# Patient Record
Sex: Female | Born: 1945 | Race: White | Hispanic: No | Marital: Married | Smoking: Former smoker
Health system: Southern US, Community
[De-identification: ages and names within clinical notes are randomized; demographics above are authoritative.]

## PROBLEM LIST (undated history)

## (undated) DIAGNOSIS — I10 Essential (primary) hypertension: Secondary | ICD-10-CM

## (undated) DIAGNOSIS — I480 Paroxysmal atrial fibrillation: Secondary | ICD-10-CM

## (undated) DIAGNOSIS — J309 Allergic rhinitis, unspecified: Secondary | ICD-10-CM

## (undated) DIAGNOSIS — K219 Gastro-esophageal reflux disease without esophagitis: Secondary | ICD-10-CM

## (undated) DIAGNOSIS — F329 Major depressive disorder, single episode, unspecified: Secondary | ICD-10-CM

## (undated) DIAGNOSIS — F32A Depression, unspecified: Secondary | ICD-10-CM

## (undated) DIAGNOSIS — M1990 Unspecified osteoarthritis, unspecified site: Secondary | ICD-10-CM

## (undated) HISTORY — DX: Major depressive disorder, single episode, unspecified: F32.9

## (undated) HISTORY — DX: Allergic rhinitis, unspecified: J30.9

## (undated) HISTORY — DX: Essential (primary) hypertension: I10

## (undated) HISTORY — DX: Unspecified osteoarthritis, unspecified site: M19.90

## (undated) HISTORY — DX: Paroxysmal atrial fibrillation: I48.0

## (undated) HISTORY — DX: Depression, unspecified: F32.A

## (undated) HISTORY — PX: TUBAL LIGATION: SHX77

## (undated) HISTORY — PX: DILATION AND CURETTAGE OF UTERUS: SHX78

---

## 2013-04-27 ENCOUNTER — Encounter: Payer: Self-pay | Admitting: Cardiology

## 2014-02-08 ENCOUNTER — Encounter: Payer: Self-pay | Admitting: Cardiology

## 2014-02-08 DIAGNOSIS — I10 Essential (primary) hypertension: Secondary | ICD-10-CM | POA: Insufficient documentation

## 2014-02-08 NOTE — Progress Notes (Signed)
Clinical Summary Tara Floyd is a 68 y.o.female referred for cardiology consultation by Dr. Melina Copa. She has a long-standing history of hypertension, has been difficult to control over the years with different medications. She states that she was on Norvasc at one point although felt "bloated" with this and had to stop. Also on Diovan which was ineffective. States that Atacand was more effective, however price is prohibitive for her. Current regimen includes Lotensin 40 mg daily and Coreg 6.25 mg twice daily, and has been tolerated without difficulty.  She denies any exertional symptoms, does Zumba 4 days a week and feels well.  Lab work from January of this year showed a hemoglobin 13.1, platelets 170, BUN 16, creatinine 0.6, potassium 4.3, normal LFTs, TSH 0.9.  ECG today shows sinus bradycardia with left anterior fascicular block, increased voltage, and nonspecific ST-T changes.   Allergies  Allergen Reactions  . Codeine Phosphate     Current Outpatient Prescriptions  Medication Sig Dispense Refill  . ALPRAZolam (XANAX) 0.5 MG tablet Take 0.5 mg by mouth 3 (three) times daily.      . benazepril-hydrochlorthiazide (LOTENSIN HCT) 20-12.5 MG per tablet Take 1 tablet by mouth 2 (two) times daily.  60 tablet  6  . Calcium Carb-Cholecalciferol (CALCIUM + D3) 600-200 MG-UNIT TABS Take 1 tablet by mouth daily.      . carvedilol (COREG) 6.25 MG tablet Take 6.25 mg by mouth 2 (two) times daily with a meal.      . fluticasone (FLONASE) 50 MCG/ACT nasal spray Place 2 sprays into both nostrils daily.      Marland Kitchen ibuprofen (ADVIL,MOTRIN) 200 MG tablet Take 800 mg by mouth every 6 (six) hours as needed.       No current facility-administered medications for this visit.    Past Medical History  Diagnosis Date  . Essential hypertension, benign   . Depression   . Allergic rhinitis   . DJD (degenerative joint disease)     Past Surgical History  Procedure Laterality Date  . Tubal ligation    .  Dilation and curettage of uterus      Family History  Problem Relation Age of Onset  . Parkinson's disease Father   . Brain cancer Mother     Social History Ms. Round reports that she quit smoking about 24 years ago. Her smoking use included Cigarettes. She smoked 0.00 packs per day. She does not have any smokeless tobacco history on file. Ms. Tippins reports that she does not drink alcohol.  Review of Systems No palpitations, exertional chest pain or unusual shortness of breath, syncope, leg edema, orthopnea or PND.  Physical Examinatio Filed Vitals:   02/09/14 1300  BP: 187/80  Pulse: 55   Filed Weights   02/09/14 1257  Weight: 178 lb (80.74 kg)   Patient appears comfortable at rest. HEENT: Conjunctiva and lids normal, oropharynx clear. Neck: Supple, no elevated JVP or carotid bruits, no thyromegaly. Lungs: Clear to auscultation, nonlabored breathing at rest. Cardiac: Regular rate and rhythm, S4 but no S3 or significant systolic murmur, no pericardial rub. Abdomen: Soft, nontender, bowel sounds present, no guarding or rebound. Extremities: No pitting edema, distal pulses 2+. Skin: Warm and dry. Musculoskeletal: No kyphosis. Neuropsychiatric: Alert and oriented x3, affect grossly appropriate.   Problem List and Plan   Essential hypertension, benign Long-standing and reportedly difficult to control over the years. We discussed sodium restriction. She exercises regularly without symptoms. ECG is abnormal consistent with LVH and repolarization abnormalities. 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 Plan is to switch to Lotensin hydrochlorothiazide 20/12.5 mg twice daily, continue Coreg. Check BMET in a few weeks as well as echocardiogram, then followup in one month.    Satira Sark, M.D., F.A.C.C.

## 2014-02-09 ENCOUNTER — Encounter: Payer: Self-pay | Admitting: Cardiology

## 2014-02-09 ENCOUNTER — Encounter: Payer: Self-pay | Admitting: Cardiovascular Disease

## 2014-02-09 ENCOUNTER — Ambulatory Visit (INDEPENDENT_AMBULATORY_CARE_PROVIDER_SITE_OTHER): Payer: Medicare Other | Admitting: Cardiology

## 2014-02-09 VITALS — BP 187/80 | HR 55 | Ht 64.0 in | Wt 178.0 lb

## 2014-02-09 DIAGNOSIS — I119 Hypertensive heart disease without heart failure: Secondary | ICD-10-CM

## 2014-02-09 DIAGNOSIS — Z79899 Other long term (current) drug therapy: Secondary | ICD-10-CM

## 2014-02-09 DIAGNOSIS — I10 Essential (primary) hypertension: Secondary | ICD-10-CM

## 2014-02-09 MED ORDER — BENAZEPRIL-HYDROCHLOROTHIAZIDE 20-12.5 MG PO TABS: 1.0000 | ORAL_TABLET | Freq: Two times a day (BID) | ORAL | Status: DC

## 2014-02-09 NOTE — Assessment & Plan Note (Signed)
Long-standing and reportedly difficult to control over the years. We discussed sodium restriction. She exercises regularly without symptoms. ECG is abnormal consistent with LVH and repolarization abnormalities. 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 Plan is to switch to Lotensin hydrochlorothiazide 20/12.5 mg twice daily, continue Coreg. Check BMET in a few weeks as well as echocardiogram, then followup in one month.

## 2014-02-09 NOTE — Patient Instructions (Signed)
Your physician has requested that you have an echocardiogram. Echocardiography is a painless test that uses sound waves to create images of your heart. It provides your doctor with information about the size and shape of your heart and how well your heart's chambers and valves are working. This procedure takes approximately one hour. There are no restrictions for this procedure. Lab for BMET - due in 3 weeks, around 03/02/2014 Office will contact with results via phone or letter.   Stop Benazepril (Lotensin) Begin Lotensin / HCTZ 20/12.5mg  twice a day  - new sent to pharm Continue all other medications.   Follow up in  1 month

## 2014-02-13 ENCOUNTER — Telehealth: Payer: Self-pay | Admitting: *Deleted

## 2014-02-13 NOTE — Telephone Encounter (Signed)
Recently saw patient on 02/09/2014.  Stopped Benazepril 40mg  daily & changed to Benazepril/HCTZ 20-12.5mg  twice a day.  [facility] denied quantity limit requirement since her plan only allows one tab per day.  Please advise.

## 2014-02-13 NOTE — Telephone Encounter (Signed)
They do not have the combination of medications in one pill that I was looking for, which is why I made the prescription the way I did. Other option would be to continue benazepril 40 mg daily, and add a separate prescription for HCTZ starting at 12.5 mg daily.

## 2014-02-14 MED ORDER — HYDROCHLOROTHIAZIDE 12.5 MG PO CAPS: 12.5000 mg | ORAL_CAPSULE | Freq: Every day | ORAL | Status: DC

## 2014-02-14 MED ORDER — BENAZEPRIL HCL 40 MG PO TABS: 40.0000 mg | ORAL_TABLET | Freq: Every day | ORAL | Status: DC

## 2014-02-14 NOTE — Telephone Encounter (Signed)
Patient notified.  States she has continued her Benazepril 40mg  daily.  Will add new rx for HCTZ 12.5mg  daily & send to [facility].

## 2014-02-18 ENCOUNTER — Encounter: Payer: Self-pay | Admitting: Cardiology

## 2014-02-18 ENCOUNTER — Encounter: Payer: Self-pay | Admitting: Cardiovascular Disease

## 2014-02-19 ENCOUNTER — Telehealth: Payer: Self-pay | Admitting: Cardiology

## 2014-02-19 NOTE — Telephone Encounter (Signed)
Pt left [facility] AMA.  Pt didn't want to wait to have her echo done there.  Please call pt to r/s pt in [place] office

## 2014-02-20 ENCOUNTER — Telehealth: Payer: Self-pay | Admitting: *Deleted

## 2014-02-20 MED ORDER — AMIODARONE HCL 200 MG PO TABS: 200.0000 mg | ORAL_TABLET | Freq: Two times a day (BID) | ORAL | Status: DC

## 2014-02-20 NOTE — Telephone Encounter (Signed)
Patient called to inform office that she was inpatient at [facility] 02/18/14 and d/c 02/19/14 for rapid heart rate. Patient said today about 30 minutes ago, her heart rate starting racing. Patient said her HR was 110 during that time. Patient took BP and HR while on the phone with nurse and her BP was 138/90 & HR 104. Patient denied chest pain, or sob but said she had some lightheadedness. Nurse advised patient that she had up coming appointment this week for an echo and f/u appointment scheduled with Domenic Polite on 03/13/14. Patient concerned about her heart rate now and wants to know if there's anything she can do for it. Please advise.

## 2014-02-20 NOTE — Telephone Encounter (Signed)
I just met her as a new consult in early May. Note reviewed. She was admitted to [facility] within the last 48 hours with reportedly newly diagnosed rapid atrial fibrillation. It looks like she converted spontaneously to sinus rhythm under observation, lab work showed no evidence of ACS, TSH normal, LFTs normal. For now I would suggest that we start her on amiodarone 200 mg twice daily until I see her back for rhythm suppression since she is symptomatic with it. She should at least be taking an aspirin daily now. CHADSVASC score is 3 and we may need to discuss potential anticoagulation. She should follow for the echocardiogram as already arranged.

## 2014-02-20 NOTE — Telephone Encounter (Signed)
Patient informed and verbalized understanding of plan. 

## 2014-02-21 ENCOUNTER — Telehealth: Payer: Self-pay | Admitting: *Deleted

## 2014-02-21 NOTE — Telephone Encounter (Signed)
Patient called to inform our office that she thinks the new medication is causing her heart rate to increase. Patient said when she got up this morning, her BP was 137/65 & HR 50. 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 No chest pain, dizziness or sob. Nurse advised patient that amiodarone wouldn't cause her heart rate to increase and that her heart rate was much better than it was before starting the amiodarone. Nurse advised patient that doing house work would increase her heart rate and she should only take her heart rate at rest. Patient informed that MD would be notified of her concerns.

## 2014-02-21 NOTE — Telephone Encounter (Signed)
We will keep an eye on this. Goal is to suppress atrial fibrillation and avoid unnecessary tachycardia.

## 2014-02-22 ENCOUNTER — Other Ambulatory Visit (INDEPENDENT_AMBULATORY_CARE_PROVIDER_SITE_OTHER): Payer: Medicare Other

## 2014-02-22 ENCOUNTER — Other Ambulatory Visit: Payer: Medicare Other

## 2014-02-22 ENCOUNTER — Other Ambulatory Visit: Payer: Self-pay

## 2014-02-22 DIAGNOSIS — I119 Hypertensive heart disease without heart failure: Secondary | ICD-10-CM

## 2014-02-22 DIAGNOSIS — I059 Rheumatic mitral valve disease, unspecified: Secondary | ICD-10-CM

## 2014-02-22 DIAGNOSIS — I4891 Unspecified atrial fibrillation: Secondary | ICD-10-CM

## 2014-02-27 ENCOUNTER — Telehealth: Payer: Self-pay | Admitting: *Deleted

## 2014-02-27 NOTE — Telephone Encounter (Signed)
Patient informed. 

## 2014-02-27 NOTE — Telephone Encounter (Signed)
Message copied by Merlene Laughter on Tue Feb 27, 2014  4:54 PM ------      Message from: Satira Sark      Created: Tue Feb 27, 2014  7:54 AM       Reviewed. Mild LVH and grade 1 diastolic dysfunction consistent with hypertension, normal systolic function with LVEF 60-65%. Will review further at office followup. ------

## 2014-03-01 ENCOUNTER — Encounter: Payer: Self-pay | Admitting: Cardiology

## 2014-03-13 ENCOUNTER — Ambulatory Visit (INDEPENDENT_AMBULATORY_CARE_PROVIDER_SITE_OTHER): Payer: Medicare Other | Admitting: Cardiology

## 2014-03-13 ENCOUNTER — Encounter: Payer: Self-pay | Admitting: Cardiology

## 2014-03-13 VITALS — BP 195/73 | HR 49 | Ht 64.0 in | Wt 175.8 lb

## 2014-03-13 DIAGNOSIS — I4891 Unspecified atrial fibrillation: Secondary | ICD-10-CM

## 2014-03-13 DIAGNOSIS — I480 Paroxysmal atrial fibrillation: Secondary | ICD-10-CM | POA: Insufficient documentation

## 2014-03-13 DIAGNOSIS — I10 Essential (primary) hypertension: Secondary | ICD-10-CM

## 2014-03-13 MED ORDER — SPIRONOLACTONE 25 MG PO TABS: 25.0000 mg | ORAL_TABLET | Freq: Every day | ORAL | Status: DC

## 2014-03-13 NOTE — Progress Notes (Signed)
Clinical Summary Tara Floyd is a 68 y.o.female seen in early May in referral from Dr. Melina Copa for assistance with management of hypertension. At that time medications were adjusted including a change to Lotensin 40 mg daily with HCTZ 12.5 mg daily. Record review finds that she was admitted briefly to [facility] later in May with an episode of paroxysmal atrial fibrillation, new diagnosis. Cardiac markers were negative at that time. She called the office after this occurred, still somewhat symptomatic with palpitations and we decided to try amiodarone for rhythm suppression. She took this for about a week he was unable to tolerate it. She states it made her feel dizzy. Since then however she has had no further palpitations and is in sinus rhythm today.  Recent followup lab work showed BUN 11, creatinine 0.8, potassium 4.9, TSH 0.56, hemoglobin 16.1, platelets 194. Chest x-ray from May 10 showed left basilar atelectasis, no other acute findings.   Echocardiogram from May 14 showed mild LVH with LVEF 32-95%, grade 1 diastolic dysfunction, mild to moderate left atrial enlargement, mild mitral regurgitation, mildly thickened aortic valve, mild tricuspid regurgitation. We discussed the results today.  CHADSVASC score is 3. We reviewed her thromboembolic risk. She did not want to consider taking an anticoagulant at this point. She states that she is very concerned about the potential bleeding risk. She is willing to take an aspirin however.  She tells me that when she checks her blood pressure at home, systolics are usually in the 150s to 160s.   Allergies  Allergen Reactions  . Codeine Phosphate     Current Outpatient Prescriptions  Medication Sig Dispense Refill  . ALPRAZolam (XANAX) 0.5 MG tablet Take 0.5 mg by mouth 3 (three) times daily as needed.       . benazepril (LOTENSIN) 40 MG tablet Take 1 tablet (40 mg total) by mouth daily.      . Calcium Carb-Cholecalciferol (CALCIUM + D3)  600-200 MG-UNIT TABS Take 1 tablet by mouth daily.      . carvedilol (COREG) 6.25 MG tablet Take 6.25 mg by mouth 2 (two) times daily with a meal.      . fluticasone (FLONASE) 50 MCG/ACT nasal spray Place 2 sprays into both nostrils daily.      Marland Kitchen ibuprofen (ADVIL,MOTRIN) 200 MG tablet Take 800 mg by mouth every 6 (six) hours as needed.      Marland Kitchen spironolactone (ALDACTONE) 25 MG tablet Take 1 tablet (25 mg total) by mouth daily.  90 tablet  3   No current facility-administered medications for this visit.    Past Medical History  Diagnosis Date  . Essential hypertension, benign   . Depression   . Allergic rhinitis   . DJD (degenerative joint disease)     Social History Tara Floyd reports that she quit smoking about 24 years ago. Her smoking use included Cigarettes. She smoked 0.00 packs per day. She does not have any smokeless tobacco history on file. Tara Floyd reports that she does not drink alcohol.  Review of Systems Negative except as outlined.  Physical Examination Filed Vitals:   03/13/14 0848  BP: 195/73  Pulse: 49   Filed Weights   03/13/14 0843  Weight: 175 lb 12.8 oz (79.742 kg)   Patient appears comfortable at rest.  HEENT: Conjunctiva and lids normal, oropharynx clear.  Neck: Supple, no elevated JVP or carotid bruits, no thyromegaly.  Lungs: Clear to auscultation, nonlabored breathing at rest.  Cardiac: Regular rate and rhythm, S4 but  no S3 or significant systolic murmur, no pericardial rub.  Abdomen: Soft, nontender, bowel sounds present, no guarding or rebound.  Extremities: No pitting edema, distal pulses 2+.  Skin: Warm and dry.  Musculoskeletal: No kyphosis.  Neuropsychiatric: Alert and oriented x3, affect grossly appropriate.   Problem List and Plan   Essential hypertension, benign Continue Lotensin, Coreg, change HCTZ to Aldactone 25 mg daily. Check BMET in 2 weeks, clinical visit in one month. I suspect we will need continued medication adjustments to get  more optimal blood pressure control.  Atrial fibrillation Paroxysmal, newly diagnosed. We did discuss thromboembolic risk (relatitvely low) and potential use of anticoagulants, she did not want to take an anticoagulant at this point however. She is willing to take aspirin daily.    Satira Sark, M.D., F.A.C.C.

## 2014-03-13 NOTE — Assessment & Plan Note (Signed)
Continue Lotensin, Coreg, change HCTZ to Aldactone 25 mg daily. Check BMET in 2 weeks, clinical visit in one month. I suspect we will need continued medication adjustments to get more optimal blood pressure control.

## 2014-03-13 NOTE — Patient Instructions (Signed)
Your physician recommends that you schedule a follow-up appointment in: 1 month. Your physician has recommended you make the following change in your medication:  Stop hydrochlorothiazide. Start spironolactone 25 mg daily. Your new prescription was sent to your pharmacy. Continue all other medications. Your physician recommends that you have lab work to check your BMET. Please have this done around 03/27/14.

## 2014-03-13 NOTE — Assessment & Plan Note (Signed)
Paroxysmal, newly diagnosed. We did discuss thromboembolic risk (relatitvely low) and potential use of anticoagulants, she did not want to take an anticoagulant at this point however. She is willing to take aspirin daily.

## 2014-04-05 ENCOUNTER — Telehealth: Payer: Self-pay | Admitting: *Deleted

## 2014-04-05 NOTE — Telephone Encounter (Signed)
Patient informed. 

## 2014-04-05 NOTE — Telephone Encounter (Signed)
Message copied by Merlene Laughter on Thu Apr 05, 2014  4:07 PM ------      Message from: MCDOWELL, Aloha Gell      Created: Mon Apr 02, 2014  9:16 PM       Renal function normal and potassium 5.1.  No change in medications for now. ------

## 2014-04-23 ENCOUNTER — Encounter: Payer: Self-pay | Admitting: Cardiology

## 2014-04-23 ENCOUNTER — Ambulatory Visit (INDEPENDENT_AMBULATORY_CARE_PROVIDER_SITE_OTHER): Payer: Medicare Other | Admitting: Cardiology

## 2014-04-23 VITALS — BP 156/74 | HR 66 | Ht 64.0 in | Wt 173.0 lb

## 2014-04-23 DIAGNOSIS — I10 Essential (primary) hypertension: Secondary | ICD-10-CM

## 2014-04-23 DIAGNOSIS — I4891 Unspecified atrial fibrillation: Secondary | ICD-10-CM

## 2014-04-23 DIAGNOSIS — I480 Paroxysmal atrial fibrillation: Secondary | ICD-10-CM

## 2014-04-23 NOTE — Patient Instructions (Signed)
Your physician recommends that you schedule a follow-up appointment in: 6 months. You will receive a reminder letter in the mail in about 4 months reminding you to call and schedule your appointment. If you don't receive this letter, please contact our office. Your physician recommends that you continue on your current medications as directed. Please refer to the Current Medication list given to you today. Your physician recommends that you have lab work in 6 months just before your visit to check your BMET.

## 2014-04-23 NOTE — Assessment & Plan Note (Signed)
One episode noted, asymptomatic at this point. CHADSVASC score is 3. We have reviewed her thromboembolic risk, and so far she has declined anticoagulation

## 2014-04-23 NOTE — Assessment & Plan Note (Signed)
Blood pressure trend improving on review of home checks. Plan to continue current regimen, limits sodium in the diet. Followup in 6 months with BMET.

## 2014-04-23 NOTE — Progress Notes (Signed)
    Clinical Summary Tara Floyd is a 68 y.o.female last seen in June. At that time medications were adjusted. She has done well since that time. We reviewed home blood pressure and heart rate checks, pressure trend continues to improve. Her heart rate is in the 60s now and she feels better - it had been lower  Followup lab work from June showed BUN 24, creatinine 0.7, potassium 5.1, sodium 139.  Echocardiogram from May 14 showed mild LVH with LVEF 83-38%, grade 1 diastolic dysfunction, mild to moderate left atrial enlargement, mild mitral regurgitation, mildly thickened aortic valve, mild tricuspid regurgitation.   CHADSVASC score is 3. We have reviewed her thromboembolic risk, and so far she has declined anticoagulation    Allergies  Allergen Reactions  . Codeine Phosphate     Current Outpatient Prescriptions  Medication Sig Dispense Refill  . ALPRAZolam (XANAX) 0.5 MG tablet Take 0.5 mg by mouth 3 (three) times daily as needed.       . benazepril (LOTENSIN) 40 MG tablet Take 1 tablet (40 mg total) by mouth daily.      . Calcium Carb-Cholecalciferol (CALCIUM + D3) 600-200 MG-UNIT TABS Take 1 tablet by mouth daily.      . fluticasone (FLONASE) 50 MCG/ACT nasal spray Place 2 sprays into both nostrils daily.      Marland Kitchen ibuprofen (ADVIL,MOTRIN) 200 MG tablet Take 800 mg by mouth every 6 (six) hours as needed.      Marland Kitchen spironolactone (ALDACTONE) 25 MG tablet Take 1 tablet (25 mg total) by mouth daily.  90 tablet  3  . carvedilol (COREG) 6.25 MG tablet Take 6.25 mg by mouth 2 (two) times daily with a meal.       No current facility-administered medications for this visit.    Past Medical History  Diagnosis Date  . Essential hypertension, benign   . Depression   . Allergic rhinitis   . DJD (degenerative joint disease)   . PAF (paroxysmal atrial fibrillation)     Social History Ms. Jelley reports that she quit smoking about 24 years ago. Her smoking use included Cigarettes. She smoked 0.00  packs per day. She does not have any smokeless tobacco history on file. Ms. Harmsen reports that she does not drink alcohol.  Review of Systems Other systems reviewed and negative.  Physical Examination Filed Vitals:   04/23/14 0833  BP: 156/74  Pulse: 66   Filed Weights   04/23/14 0833  Weight: 173 lb (78.472 kg)   Patient appears comfortable at rest.  HEENT: Conjunctiva and lids normal, oropharynx clear.  Neck: Supple, no elevated JVP or carotid bruits, no thyromegaly.  Lungs: Clear to auscultation, nonlabored breathing at rest.  Cardiac: Regular rate and rhythm, S4 but no S3 or significant systolic murmur, no pericardial rub.  Abdomen: Soft, nontender, bowel sounds present, no guarding or rebound.  Extremities: No pitting edema, distal pulses 2+.    Problem List and Plan   Essential hypertension, benign Blood pressure trend improving on review of home checks. Plan to continue current regimen, limits sodium in the diet. Followup in 6 months with BMET.  PAF (paroxysmal atrial fibrillation) One episode noted, asymptomatic at this point. CHADSVASC score is 3. We have reviewed her thromboembolic risk, and so far she has declined anticoagulation     Satira Sark, M.D., F.A.C.C.

## 2014-05-21 ENCOUNTER — Encounter: Payer: Self-pay | Admitting: Cardiology

## 2014-08-01 ENCOUNTER — Other Ambulatory Visit: Payer: Self-pay | Admitting: *Deleted

## 2014-08-01 DIAGNOSIS — I480 Paroxysmal atrial fibrillation: Secondary | ICD-10-CM

## 2014-08-01 DIAGNOSIS — I10 Essential (primary) hypertension: Secondary | ICD-10-CM

## 2014-11-04 ENCOUNTER — Telehealth: Payer: Self-pay | Admitting: *Deleted

## 2014-11-04 NOTE — Telephone Encounter (Signed)
Patient informed. 

## 2014-11-04 NOTE — Telephone Encounter (Signed)
-----   Message from Satira Sark, MD sent at 11/02/2014  8:05 AM EST ----- Reviewed. LFTs are normal. Potassium and creatinine normal. No changes for now.

## 2014-11-05 ENCOUNTER — Ambulatory Visit: Payer: Medicare Other | Admitting: Cardiology

## 2014-11-06 ENCOUNTER — Ambulatory Visit (INDEPENDENT_AMBULATORY_CARE_PROVIDER_SITE_OTHER): Payer: Medicare Other | Admitting: Cardiology

## 2014-11-06 ENCOUNTER — Encounter: Payer: Self-pay | Admitting: Cardiology

## 2014-11-06 VITALS — BP 132/82 | HR 74 | Ht 64.0 in | Wt 177.0 lb

## 2014-11-06 DIAGNOSIS — I480 Paroxysmal atrial fibrillation: Secondary | ICD-10-CM

## 2014-11-06 DIAGNOSIS — I10 Essential (primary) hypertension: Secondary | ICD-10-CM

## 2014-11-06 NOTE — Progress Notes (Signed)
   Reason for visit: Atrial fibrillation  Clinical Summary [name] is a 69 y.o.female last seen in July 2015. She comes in for a routine visit. She reports no significant palpitations. No chest pain with exertion. She will be seeing Dr. Melina Copa soon for a physical.  Recent lab work showed BUN 18, creatinine 1.0, potassium 3.9, normal LFTs.  CHADSVASC score is 3. We have reviewed her thromboembolic risk, and so far she has declined anticoagulation   Echocardiogram from May 2015 showed mild LVH with LVEF 14-97%, grade 1 diastolic dysfunction, mild to moderate left atrial enlargement, mild mitral regurgitation, mildly thickened aortic valve, mild tricuspid regurgitation.   Allergies  Allergen Reactions  . Codeine Phosphate     Current Outpatient Prescriptions  Medication Sig Dispense Refill  . ALPRAZolam (XANAX) 0.5 MG tablet Take 0.5 mg by mouth as needed.     . benazepril (LOTENSIN) 40 MG tablet Take 1 tablet (40 mg total) by mouth daily.    . fluticasone (FLONASE) 50 MCG/ACT nasal spray Place 2 sprays into both nostrils daily.    Marland Kitchen ibuprofen (ADVIL,MOTRIN) 200 MG tablet Take 800 mg by mouth every 6 (six) hours as needed.    Marland Kitchen spironolactone (ALDACTONE) 25 MG tablet Take 1 tablet (25 mg total) by mouth daily. 90 tablet 3   No current facility-administered medications for this visit.    Past Medical History  Diagnosis Date  . Essential hypertension, benign   . Depression   . Allergic rhinitis   . DJD (degenerative joint disease)   . PAF (paroxysmal atrial fibrillation)     Social History Ms. Willner reports that she quit smoking about 25 years ago. Her smoking use included Cigarettes. She has never used smokeless tobacco. Ms. Detert reports that she does not drink alcohol.  Review of Systems Complete review of systems negative except as otherwise outlined in the clinical summary and also the following. No orthopnea or PND. Relatively stable weight.  Physical Examination Filed  Vitals:   11/06/14 1117  BP: 132/82  Pulse: 74    Wt Readings from Last 3 Encounters:  11/06/14 177 lb (80.287 kg)  04/23/14 173 lb (78.472 kg)  03/13/14 175 lb 12.8 oz (79.742 kg)   Patient appears comfortable at rest.  HEENT: Conjunctiva and lids normal, oropharynx clear.  Neck: Supple, no elevated JVP or carotid bruits, no thyromegaly.  Lungs: Clear to auscultation, nonlabored breathing at rest.  Cardiac: Regular rate and rhythm, S4 but no S3 or significant systolic murmur, no pericardial rub.  Abdomen: Soft, nontender, bowel sounds present, no guarding or rebound.  Extremities: No pitting edema, distal pulses 2+.    Problem List and Plan   PAF (paroxysmal atrial fibrillation) Symptomatically well controlled. We continue observation for now. As noted above, she has declined anticoagulation so far.   Essential hypertension, benign Continues on benazepril and Aldactone. Keep follow-up with Dr. Melina Copa.     Satira Sark, M.D., F.A.C.C.

## 2014-11-06 NOTE — Patient Instructions (Signed)

## 2014-11-06 NOTE — Assessment & Plan Note (Signed)
Symptomatically well controlled. We continue observation for now. As noted above, she has declined anticoagulation so far.

## 2014-11-06 NOTE — Assessment & Plan Note (Signed)
Continues on benazepril and Aldactone. Keep follow-up with Dr. Melina Copa.

## 2015-02-07 ENCOUNTER — Other Ambulatory Visit: Payer: Self-pay | Admitting: *Deleted

## 2015-02-07 MED ORDER — SPIRONOLACTONE 25 MG PO TABS: 25.0000 mg | ORAL_TABLET | Freq: Every day | ORAL | Status: DC

## 2015-05-14 ENCOUNTER — Encounter: Payer: Self-pay | Admitting: Cardiology

## 2015-05-14 ENCOUNTER — Ambulatory Visit (INDEPENDENT_AMBULATORY_CARE_PROVIDER_SITE_OTHER): Payer: Medicare Other | Admitting: Cardiology

## 2015-05-14 VITALS — BP 138/80 | HR 68 | Ht 64.0 in | Wt 175.0 lb

## 2015-05-14 DIAGNOSIS — I10 Essential (primary) hypertension: Secondary | ICD-10-CM | POA: Diagnosis not present

## 2015-05-14 DIAGNOSIS — I480 Paroxysmal atrial fibrillation: Secondary | ICD-10-CM

## 2015-05-14 NOTE — Patient Instructions (Signed)
Your physician wants you to follow-up in: 6 months with Dr. McDowell You will receive a reminder letter in the mail two months in advance. If you don't receive a letter, please call our office to schedule the follow-up appointment.  Your physician recommends that you continue on your current medications as directed. Please refer to the Current Medication list given to you today.  Thank you for choosing [facility]!!    

## 2015-05-14 NOTE — Progress Notes (Signed)
Cardiology Office Note  Date: 05/14/2015   ID: CHARDONNAY HOLZMANN, DOB [birth year], MRN [id number]  PCP: Octavio Graves, DO  Primary Cardiologist: Rozann Lesches, MD   Chief Complaint  Patient presents with  . PAF    History of Present Illness: Tara [place] is a 69 y.o. female last seen in January. She presents for a routine follow-up visit. Continues to very well, no palpitations, chest pain, or shortness of breath. She stays very active, working in her yard, also attends regular exercise classes.  Follow-up ECG today shows normal sinus rhythm. We have not documented any atrial fibrillation in the last year.  CHADSVASC score is 3. We have reviewed her thromboembolic risk, and so far she has declined anticoagulation. She does take an aspirin daily.   Past Medical History  Diagnosis Date  . Essential hypertension, benign   . Depression   . Allergic rhinitis   . DJD (degenerative joint disease)   . PAF (paroxysmal atrial fibrillation)     Current Outpatient Prescriptions  Medication Sig Dispense Refill  . ALPRAZolam (XANAX) 0.5 MG tablet Take 0.5 mg by mouth as needed.     Marland Kitchen aspirin 81 MG tablet Take 81 mg by mouth daily.    . benazepril (LOTENSIN) 40 MG tablet Take 1 tablet (40 mg total) by mouth daily.    . fluticasone (FLONASE) 50 MCG/ACT nasal spray Place 2 sprays into both nostrils daily.    Marland Kitchen ibuprofen (ADVIL,MOTRIN) 200 MG tablet Take 800 mg by mouth every 6 (six) hours as needed.    Marland Kitchen spironolactone (ALDACTONE) 25 MG tablet Take 1 tablet (25 mg total) by mouth daily. 90 tablet 3   No current facility-administered medications for this visit.    Allergies:  Codeine phosphate   Social History: The patient  reports that she quit smoking about 25 years ago. Her smoking use included Cigarettes. She has never used smokeless tobacco. She reports that she does not drink alcohol or use illicit drugs.   ROS:  Please see the history of present illness. Otherwise, complete review  of systems is positive for none.  All other systems are reviewed and negative.   Physical Exam: VS:  BP 138/80 mmHg  Pulse 68  Ht 5\' 4"  (1.626 m)  Wt 175 lb (79.379 kg)  BMI 30.02 kg/m2  SpO2 97%, BMI Body mass index is 30.02 kg/(m^2).  Wt Readings from Last 3 Encounters:  05/14/15 175 lb (79.379 kg)  11/06/14 177 lb (80.287 kg)  04/23/14 173 lb (78.472 kg)     Patient appears comfortable at rest.  HEENT: Conjunctiva and lids normal, oropharynx clear.  Neck: Supple, no elevated JVP or carotid bruits, no thyromegaly.  Lungs: Clear to auscultation, nonlabored breathing at rest.  Cardiac: Regular rate and rhythm, S4 but no S3 or significant systolic murmur, no pericardial rub.  Abdomen: Soft, nontender, bowel sounds present, no guarding or rebound.  Extremities: No pitting edema, distal pulses 2+.    ECG: ECG is ordered today and shows normal sinus rhythm with decreased R wave progression, left anterior fascicular block, nonspecific T-wave changes.   Recent Labwork:  10/31/2014: BUN 18, creatinine 1.0, potassium 3.9, AST 19, ALT 14  Other Studies Reviewed Today:  Echocardiogram 02/22/2014: . Study Conclusions  - Left ventricle: The cavity size was normal. Wall thickness was increased in a pattern of mild LVH. Systolic function was normal. The estimated ejection fraction was in the range of 60% to 65%. Wall motion was normal; there were  no regional wall motion abnormalities. There was an increased relative contribution of atrial contraction to ventricular filling. Doppler parameters are consistent with abnormal left ventricular relaxation (grade 1 diastolic dysfunction). - Aortic valve: Trileaflet; mildly thickened leaflets. There was no stenosis. - Mitral valve: Mildly thickened leaflets . Mild regurgitation. - Left atrium: The atrium was mildly to moderately dilated. - Tricuspid valve: Mild regurgitation.  Assessment and Plan:  1. Paroxysmal  atrial fibrillation, symptomatically quite stable without any documented arrhythmia over the last year. We have discussed thromboembolic risk (CHADSVASC score 3), she remains comfortable staying on aspirin at this point however. We will continue observation.  2. Essential hypertension, reasonable blood pressure control today.  Current medicines were reviewed with the patient today.   Orders Placed This Encounter  Procedures  . EKG 12-Lead    Disposition: FU with me in 6 months.   Signed, Satira Sark, MD, [facility] 05/14/2015 8:16 AM    [facility] [place], [place], [place] [address] Phone: [phone]; Fax: [phone]

## 2015-11-18 ENCOUNTER — Ambulatory Visit (INDEPENDENT_AMBULATORY_CARE_PROVIDER_SITE_OTHER): Payer: Medicare Other | Admitting: Cardiology

## 2015-11-18 ENCOUNTER — Encounter: Payer: Self-pay | Admitting: Cardiology

## 2015-11-18 VITALS — BP 155/81 | HR 59 | Ht 64.0 in | Wt 177.6 lb

## 2015-11-18 DIAGNOSIS — I10 Essential (primary) hypertension: Secondary | ICD-10-CM | POA: Diagnosis not present

## 2015-11-18 DIAGNOSIS — I480 Paroxysmal atrial fibrillation: Secondary | ICD-10-CM

## 2015-11-18 NOTE — Patient Instructions (Signed)

## 2015-11-18 NOTE — Progress Notes (Signed)
Cardiology Office Note  Date: 11/18/2015   ID: Tara [place], DOB [birth year], MRN [id number]  PCP: Tara Graves, DO  Primary Cardiologist: Tara Lesches, MD   Chief Complaint  Patient presents with  . Atrial Fibrillation    History of Present Illness: Tara [place] is a 70 y.o. female last seen in August 2016.she presents for a routine visit. 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 She does not endorse any exertional chest pain or palpitations.  We reviewed her medications which are unchanged and outlined below.her blood pressure is elevated today, she had not yet taken her Lotensin.  We have discussed her thromboembolic risk. CHADSVASC score is 3. Annual risk of stroke on aspirin is approximately 3.4%, and would be approximately 1.1% on Eliquis. She remains firm that she wants to stay on aspirin.  Past Medical History  Diagnosis Date  . Essential hypertension, benign   . Depression   . Allergic rhinitis   . DJD (degenerative joint disease)   . PAF (paroxysmal atrial fibrillation) (HCC)     Current Outpatient Prescriptions  Medication Sig Dispense Refill  . ALPRAZolam (XANAX) 0.5 MG tablet Take 0.5 mg by mouth as needed.     Marland Kitchen aspirin 81 MG tablet Take 81 mg by mouth daily.    . benazepril (LOTENSIN) 40 MG tablet Take 1 tablet (40 mg total) by mouth daily.    . fluticasone (FLONASE) 50 MCG/ACT nasal spray Place 2 sprays into both nostrils as needed.     Marland Kitchen ibuprofen (ADVIL,MOTRIN) 200 MG tablet Take 800 mg by mouth every 6 (six) hours as needed.    Marland Kitchen spironolactone (ALDACTONE) 25 MG tablet Take 1 tablet (25 mg total) by mouth daily. 90 tablet 3   No current facility-administered medications for this visit.   Allergies:  Codeine phosphate   Social History: The patient  reports that she quit smoking about 26 years ago. Her smoking use included Cigarettes. She  has never used smokeless tobacco. She reports that she does not drink alcohol or use illicit drugs.   ROS:  Please see the history of present illness. Otherwise, complete review of systems is positive for occasional arthritic pains.  All other systems are reviewed and negative.   Physical Exam: VS:  BP 155/81 mmHg  Pulse 59  Ht 5\' 4"  (1.626 m)  Wt 177 lb 9.6 oz (80.559 kg)  BMI 30.47 kg/m2  SpO2 98%, BMI Body mass index is 30.47 kg/(m^2).  Wt Readings from Last 3 Encounters:  11/18/15 177 lb 9.6 oz (80.559 kg)  05/14/15 175 lb (79.379 kg)  11/06/14 177 lb (80.287 kg)    Patient appears comfortable at rest.  HEENT: Conjunctiva and lids normal, oropharynx clear.  Neck: Supple, no elevated JVP or carotid bruits, no thyromegaly.  Lungs: Clear to auscultation, nonlabored breathing at rest.  Cardiac: Regular rate and rhythm, S4 but no S3 or significant systolic murmur. Abdomen: Soft, nontender, bowel sounds present, no guarding or rebound.  Extremities: No pitting edema, distal pulses 2+.   ECG: I reviewed her previous tracing from 05/14/2015 which showed sinus rhythm with left anterior fascicular block, decreased R wave progression, and nonspecific T-wave changes.  Recent Labwork:  August 2016: AST 15, ALT 10, BUN 21, creatinine 0.8, potassium 4.9, cholesterol 213, HDL 44, LDL 149, triglycerides 99  Other Studies Reviewed Today:  Echocardiogram 02/22/2014: Study Conclusions  -  Left ventricle: The cavity size was normal. Wall thickness was increased in a pattern of mild LVH. Systolic function was normal. The estimated ejection fraction was in the range of 60% to 65%. Wall motion was normal; there were no regional wall motion abnormalities. There was an increased relative contribution of atrial contraction to ventricular filling. Doppler parameters are consistent with abnormal left ventricular relaxation (grade 1 diastolic dysfunction). - Aortic valve: Trileaflet;  mildly thickened leaflets. There was no stenosis. - Mitral valve: Mildly thickened leaflets . Mild regurgitation. - Left atrium: The atrium was mildly to moderately dilated. - Tricuspid valve: Mild regurgitation.   Assessment and Plan:  1. Paroxysmal atrial fibrillation with CHADSVASC score of 3. As noted above, she prefers to stay on aspirin and continue with strategy of observation. She has had no obvious recurrences over the last 2 years.  2. Essential hypertension, blood pressure is elevated today but she had not yet taken her Lotensin. Continue current regimen and keep follow-up with Dr. Melina Copa.  Current medicines were reviewed with the patient today.  Disposition: FU with me in 1 year.   Signed, Tara Sark, MD, [facility] 11/18/2015 8:20 AM    [facility] [place], [place], [place] [address] Phone: [phone]; Fax: [phone]

## 2016-11-18 NOTE — Progress Notes (Deleted)
Cardiology Office Note  Date: 11/18/2016   ID: Tara [place], DOB 1946-02-18, MRN [id number]  PCP: Tara Graves, DO  Primary Cardiologist: Tara Lesches, MD   No chief complaint on file.   History of Present Illness: Tara [place] is a 71 y.o. female last seen in February 2017.   We have discussed her thromboembolic risk. CHADSVASC score is 3. Annual risk of stroke on aspirin is approximately 3.4%, and would be approximately 1.1% on Eliquis. She remains firm that she wants to stay on aspirin.  Past Medical History:  Diagnosis Date  . Allergic rhinitis   . Depression   . DJD (degenerative joint disease)   . Essential hypertension, benign   . PAF (paroxysmal atrial fibrillation) ([place])     Past Surgical History:  Procedure Laterality Date  . DILATION AND CURETTAGE OF UTERUS    . TUBAL LIGATION      Current Outpatient Prescriptions  Medication Sig Dispense Refill  . ALPRAZolam (XANAX) 0.5 MG tablet Take 0.5 mg by mouth as needed.     Marland Kitchen aspirin 81 MG tablet Take 81 mg by mouth daily.    . benazepril (LOTENSIN) 40 MG tablet Take 1 tablet (40 mg total) by mouth daily.    . fluticasone (FLONASE) 50 MCG/ACT nasal spray Place 2 sprays into both nostrils as needed.     Marland Kitchen ibuprofen (ADVIL,MOTRIN) 200 MG tablet Take 800 mg by mouth every 6 (six) hours as needed.    Marland Kitchen spironolactone (ALDACTONE) 25 MG tablet Take 1 tablet (25 mg total) by mouth daily. 90 tablet 3   No current facility-administered medications for this visit.    Allergies:  Codeine phosphate   Social History: The patient  reports that she quit smoking about 27 years ago. Her smoking use included Cigarettes. She has never used smokeless tobacco. She reports that she does not drink alcohol or use drugs.   Family History: The patient's family history includes Brain cancer in her mother; Parkinson's disease in her father.   ROS:  Please see the history of present illness. Otherwise, complete review of systems is  positive for {NONE DEFAULTED:18576::"none"}.  All other systems are reviewed and negative.   Physical Exam: VS:  There were no vitals taken for this visit., BMI There is no height or weight on file to calculate BMI.  Wt Readings from Last 3 Encounters:  11/18/15 177 lb 9.6 oz (80.6 kg)  05/14/15 175 lb (79.4 kg)  11/06/14 177 lb (80.3 kg)    Patient appears comfortable at rest.  HEENT: Conjunctiva and lids normal, oropharynx clear.  Neck: Supple, no elevated JVP or carotid bruits, no thyromegaly.  Lungs: Clear to auscultation, nonlabored breathing at rest.  Cardiac: Regular rate and rhythm, S4 but no S3 or significant systolic murmur. Abdomen: Soft, nontender, bowel sounds present, no guarding or rebound.  Extremities: No pitting edema, distal pulses 2+.   ECG: I personally reviewed the tracing from 05/14/2015 which showed sinus rhythm with decreased R wave progression, left anterior fascicular block and nonspecific T-wave changes  Recent Labwork:  August 2016: AST 15, ALT 10, BUN 21, creatinine 0.8, potassium 4.9, cholesterol 213, HDL 44, LDL 149, triglycerides 99  Other Studies Reviewed Today:  Echocardiogram 02/22/2014: Study Conclusions  - Left ventricle: The cavity size was normal. Wall thickness was increased in a pattern of mild LVH. Systolic function was normal. The estimated ejection fraction was in the range of 60% to 65%. Wall motion was normal; there were no  regional wall motion abnormalities. There was an increased relative contribution of atrial contraction to ventricular filling. Doppler parameters are consistent with abnormal left ventricular relaxation (grade 1 diastolic dysfunction). - Aortic valve: Trileaflet; mildly thickened leaflets. There was no stenosis. - Mitral valve: Mildly thickened leaflets . Mild regurgitation. - Left atrium: The atrium was mildly to moderately dilated. - Tricuspid valve: Mild regurgitation.  Assessment and  Plan:    Current medicines were reviewed with the patient today.  No orders of the defined types were placed in this encounter.   Disposition:  Signed, Tara Sark, MD, [facility] 11/18/2016 10:16 AM    [facility] at [place], [place],  [address] Phone: [phone]; Fax: [phone]

## 2016-11-19 ENCOUNTER — Ambulatory Visit: Payer: Medicare Other | Admitting: Cardiology

## 2016-12-21 ENCOUNTER — Encounter: Payer: Self-pay | Admitting: Cardiology

## 2016-12-21 ENCOUNTER — Ambulatory Visit (INDEPENDENT_AMBULATORY_CARE_PROVIDER_SITE_OTHER): Payer: Medicare Other | Admitting: Cardiology

## 2016-12-21 VITALS — BP 135/79 | HR 76 | Ht 64.0 in | Wt 175.0 lb

## 2016-12-21 DIAGNOSIS — I10 Essential (primary) hypertension: Secondary | ICD-10-CM | POA: Diagnosis not present

## 2016-12-21 DIAGNOSIS — I480 Paroxysmal atrial fibrillation: Secondary | ICD-10-CM

## 2016-12-21 NOTE — Progress Notes (Signed)
Cardiology Office Note  Date: 12/21/2016   ID: Tara [place], DOB 09/06/1946, MRN [id number]  PCP: Tara Graves, DO  Primary Cardiologist: Tara Lesches, MD   Chief Complaint  Patient presents with  . PAF    History of Present Illness: Tara [place] is a 71 y.o. female last seen in February 2017. She presents for a routine follow-up visit. Continues to do very well. She does not report any exertional chest pain or palpitations. She exercises 4 days a week for an hour at a time. Enjoys walking outdoors when the weather permits, otherwise goes to the [facility] and also does two Zumba classes.  CHADSVASC score is 3. We have discussed anticoagulation therapy versus aspirin for stroke prophylaxis and she has declined anticoagulation over time.  I personally reviewed her ECG today which shows normal sinus rhythm.  Current medications include aspirin, Lotensin, and Aldactone.  Past Medical History:  Diagnosis Date  . Allergic rhinitis   . Depression   . DJD (degenerative joint disease)   . Essential hypertension, benign   . PAF (paroxysmal atrial fibrillation) ([place])     Past Surgical History:  Procedure Laterality Date  . DILATION AND CURETTAGE OF UTERUS    . TUBAL LIGATION      Current Outpatient Prescriptions  Medication Sig Dispense Refill  . ALPRAZolam (XANAX) 0.5 MG tablet Take 0.5 mg by mouth as needed.     Marland Kitchen aspirin 81 MG tablet Take 81 mg by mouth daily.    . benazepril (LOTENSIN) 40 MG tablet Take 1 tablet (40 mg total) by mouth daily.    . diclofenac sodium (VOLTAREN) 1 % GEL Apply 1 application topically 2 (two) times daily as needed.    . fluticasone (FLONASE) 50 MCG/ACT nasal spray Place 2 sprays into both nostrils as needed.     Marland Kitchen ibuprofen (ADVIL,MOTRIN) 200 MG tablet Take 800 mg by mouth every 6 (six) hours as needed.    Marland Kitchen spironolactone (ALDACTONE) 25 MG tablet Take 1 tablet (25 mg total) by mouth daily. 90 tablet 3   No current facility-administered  medications for this visit.    Allergies:  Codeine phosphate   Social History: The patient  reports that she quit smoking about 27 years ago. Her smoking use included Cigarettes. She started smoking about 55 years ago. She has a 13.50 pack-year smoking history. She has never used smokeless tobacco. She reports that she does not drink alcohol or use drugs.   ROS:  Please see the history of present illness. Otherwise, complete review of systems is positive for none.  All other systems are reviewed and negative.   Physical Exam: VS:  BP 135/79   Pulse 76   Ht 5\' 4"  (1.626 m)   Wt 175 lb (79.4 kg)   BMI 30.04 kg/m , BMI Body mass index is 30.04 kg/m.  Wt Readings from Last 3 Encounters:  12/21/16 175 lb (79.4 kg)  11/18/15 177 lb 9.6 oz (80.6 kg)  05/14/15 175 lb (79.4 kg)    Patient appears comfortable at rest.  HEENT: Conjunctiva and lids normal, oropharynx clear.  Neck: Supple, no elevated JVP or carotid bruits, no thyromegaly.  Lungs: Clear to auscultation, nonlabored breathing at rest.  Cardiac: Regular rate and rhythm, S4 but no S3 or significant systolic murmur. Abdomen: Soft, nontender, bowel sounds present, no guarding or rebound.  Extremities: No pitting edema, distal pulses 2+.   ECG: I personally reviewed the tracing from 05/14/2015 which showed sinus rhythm with  left anterior fascicular block, decreased R wave progression, and nonspecific T-wave changes.  Recent Labwork:  August 2016: AST 15, ALT 10, BUN 21, creatinine 0.8, potassium 4.9, cholesterol 213, HDL 44, LDL 149, triglycerides 99  Other Studies Reviewed Today:  Echocardiogram 02/22/2014: Study Conclusions  - Left ventricle: The cavity size was normal. Wall thickness was increased in a pattern of mild LVH. Systolic function was normal. The estimated ejection fraction was in the range of 60% to 65%. Wall motion was normal; there were no regional wall motion abnormalities. There was an increased  relative contribution of atrial contraction to ventricular filling. Doppler parameters are consistent with abnormal left ventricular relaxation (grade 1 diastolic dysfunction). - Aortic valve: Trileaflet; mildly thickened leaflets. There was no stenosis. - Mitral valve: Mildly thickened leaflets . Mild regurgitation. - Left atrium: The atrium was mildly to moderately dilated. - Tricuspid valve: Mild regurgitation.  Assessment and Plan:  1. Paroxysmal atrial fibrillation, symptomatically well controlled. CHADSVASC score is 3, she has declined anticoagulation and continues on aspirin. ECG shows normal sinus rhythm today. Continue observation.  2. Essential hypertension on a on Lotensin and Aldactone. Blood pressure control adequate today. She continues to follow with Dr. Melina Copa.  Current medicines were reviewed with the patient today.   Orders Placed This Encounter  Procedures  . EKG 12-Lead    Disposition: Follow-up in one year.  Signed, Tara Sark, MD, [facility] 12/21/2016 8:54 AM    [place] at [place], [place], [place] [address] Phone: [phone]; Fax: [phone]

## 2016-12-21 NOTE — Patient Instructions (Signed)

## 2018-01-03 NOTE — Progress Notes (Deleted)
Cardiology Office Note  Date: 01/03/2018   ID: Tara [place], DOB 06-23-1946, MRN [id number]  PCP: Octavio Graves, DO  Primary Cardiologist: Rozann Lesches, MD   No chief complaint on file.   History of Present Illness: Tara [place] is a 72 y.o. female last seen in March 2018.  CHADSVASC score is 3.  She has declined anticoagulation.  Past Medical History:  Diagnosis Date  . Allergic rhinitis   . Depression   . DJD (degenerative joint disease)   . Essential hypertension, benign   . PAF (paroxysmal atrial fibrillation) ([place])     Past Surgical History:  Procedure Laterality Date  . DILATION AND CURETTAGE OF UTERUS    . TUBAL LIGATION      Current Outpatient Medications  Medication Sig Dispense Refill  . ALPRAZolam (XANAX) 0.5 MG tablet Take 0.5 mg by mouth as needed.     Marland Kitchen aspirin 81 MG tablet Take 81 mg by mouth daily.    . benazepril (LOTENSIN) 40 MG tablet Take 1 tablet (40 mg total) by mouth daily.    . diclofenac sodium (VOLTAREN) 1 % GEL Apply 1 application topically 2 (two) times daily as needed.    . fluticasone (FLONASE) 50 MCG/ACT nasal spray Place 2 sprays into both nostrils as needed.     Marland Kitchen ibuprofen (ADVIL,MOTRIN) 200 MG tablet Take 800 mg by mouth every 6 (six) hours as needed.    Marland Kitchen spironolactone (ALDACTONE) 25 MG tablet Take 1 tablet (25 mg total) by mouth daily. 90 tablet 3   No current facility-administered medications for this visit.    Allergies:  Codeine phosphate   Social History: The patient  reports that she quit smoking about 28 years ago. Her smoking use included cigarettes. She started smoking about 56 years ago. She has a 13.50 pack-year smoking history. She has never used smokeless tobacco. She reports that she does not drink alcohol or use drugs.   Family History: The patient's family history includes Brain cancer in her mother; Parkinson's disease in her father.   ROS:  Please see the history of present illness. Otherwise, complete  review of systems is positive for {NONE DEFAULTED:18576::"none"}.  All other systems are reviewed and negative.   Physical Exam: VS:  There were no vitals taken for this visit., BMI There is no height or weight on file to calculate BMI.  Wt Readings from Last 3 Encounters:  12/21/16 175 lb (79.4 kg)  11/18/15 177 lb 9.6 oz (80.6 kg)  05/14/15 175 lb (79.4 kg)    General: Patient appears comfortable at rest. HEENT: Conjunctiva and lids normal, oropharynx clear with moist mucosa. Neck: Supple, no elevated JVP or carotid bruits, no thyromegaly. Lungs: Clear to auscultation, nonlabored breathing at rest. Cardiac: Regular rate and rhythm, no S3 or significant systolic murmur, no pericardial rub. Abdomen: Soft, nontender, no hepatomegaly, bowel sounds present, no guarding or rebound. Extremities: No pitting edema, distal pulses 2+. Skin: Warm and dry. Musculoskeletal: No kyphosis. Neuropsychiatric: Alert and oriented x3, affect grossly appropriate.  ECG: I personally reviewed the tracing from 12/21/2016 which showed normal sinus rhythm.  Recent Labwork:  August 2016: AST 15, ALT 10, BUN 21, creatinine 0.8, potassium 4.9, cholesterol 213, HDL 44, LDL 149, triglycerides 99  Other Studies Reviewed Today:  Echocardiogram 02/22/2014: Study Conclusions  - Left ventricle: The cavity size was normal. Wall thickness was increased in a pattern of mild LVH. Systolic function was normal. The estimated ejection fraction was in the range of  60% to 65%. Wall motion was normal; there were no regional wall motion abnormalities. There was an increased relative contribution of atrial contraction to ventricular filling. Doppler parameters are consistent with abnormal left ventricular relaxation (grade 1 diastolic dysfunction). - Aortic valve: Trileaflet; mildly thickened leaflets. There was no stenosis. - Mitral valve: Mildly thickened leaflets . Mild regurgitation. - Left atrium: The  atrium was mildly to moderately dilated. - Tricuspid valve: Mild regurgitation.  Assessment and Plan:    Current medicines were reviewed with the patient today.  No orders of the defined types were placed in this encounter.   Disposition:  Signed, Satira Sark, MD, [facility] 01/03/2018 8:26 AM    [place] at [place], [place], [place] [address] Phone: [phone]; Fax: [phone]

## 2018-01-04 ENCOUNTER — Ambulatory Visit: Payer: Medicare Other | Admitting: Cardiology

## 2018-02-15 NOTE — Progress Notes (Signed)
Cardiology Office Note  Date: 02/16/2018   ID: Tara Floyd, DOB 1946/06/03, MRN [id number]  PCP: Octavio Graves, DO  Primary Cardiologist: Rozann Lesches, MD   Chief Complaint  Patient presents with  . PAF    History of Present Illness: Tara Floyd is a 72 y.o. female last seen in March 2018.  She presents for a routine follow-up visit.  She is found to be in rapid atrial fibrillation today.  Onset is not entirely clear.  Patient states that she is not currently aware of palpitations, but did have some back in late March during an illness and her husband.  She is in no distress, reports no chest pain or shortness of breath.  CHADSVASC score is 3. We have discussed anticoagulation therapy versus aspirin for stroke prophylaxis and he has declined this in the past.  We had the discussion again today and she is in agreement to try Eliquis.  I do need to see her interval lab work per Dr. Melina Copa first.  I personally reviewed her ECG today which shows rapid atrial fibrillation in the 130s.  Past Medical History:  Diagnosis Date  . Allergic rhinitis   . Depression   . DJD (degenerative joint disease)   . Essential hypertension, benign   . PAF (paroxysmal atrial fibrillation) ([place])     Past Surgical History:  Procedure Laterality Date  . DILATION AND CURETTAGE OF UTERUS    . TUBAL LIGATION      Current Outpatient Medications  Medication Sig Dispense Refill  . ALPRAZolam (XANAX) 0.5 MG tablet Take 0.5 mg by mouth as needed.     Marland Kitchen apixaban (ELIQUIS) 5 MG TABS tablet Take 5 mg by mouth 2 (two) times daily.    Marland Kitchen aspirin 81 MG tablet Take 81 mg by mouth daily.    . benazepril (LOTENSIN) 40 MG tablet Take 1 tablet (40 mg total) by mouth daily.    . diclofenac sodium (VOLTAREN) 1 % GEL Apply 1 application topically 2 (two) times daily as needed.    . fluticasone (FLONASE) 50 MCG/ACT nasal spray Place 2 sprays into both nostrils as needed.     Marland Kitchen ibuprofen (ADVIL,MOTRIN) 200 MG tablet  Take 800 mg by mouth every 6 (six) hours as needed.    Marland Kitchen spironolactone (ALDACTONE) 25 MG tablet Take 1 tablet (25 mg total) by mouth daily. 90 tablet 3  . metoprolol succinate (TOPROL XL) 25 MG 24 hr tablet Take 1 tablet (25 mg total) by mouth daily. 30 tablet 3   No current facility-administered medications for this visit.    Allergies:  Codeine phosphate   Social History: The patient  reports that she quit smoking about 28 years ago. Her smoking use included cigarettes. She started smoking about 56 years ago. She has a 13.50 pack-year smoking history. She has never used smokeless tobacco. She reports that she does not drink alcohol or use drugs.   ROS:  Please see the history of present illness. Otherwise, complete review of systems is positive for none.  All other systems are reviewed and negative.   Physical Exam: VS:  BP (!) 144/80   Pulse (!) 134   Ht 5\' 2"  (1.575 m)   Wt 170 lb (77.1 kg)   SpO2 98%   BMI 31.09 kg/m , BMI Body mass index is 31.09 kg/m.  Wt Readings from Last 3 Encounters:  02/16/18 170 lb (77.1 kg)  12/21/16 175 lb (79.4 kg)  11/18/15 177 lb 9.6 oz (  80.6 kg)    General: Patient appears comfortable at rest. HEENT: Conjunctiva and lids normal, oropharynx clear. Neck: Supple, no elevated JVP or carotid bruits, no thyromegaly. Lungs: Clear to auscultation, nonlabored breathing at rest. Cardiac: Irregularly irregular, no S3 or significant systolic murmur, no pericardial rub. Abdomen: Soft, nontender, bowel sounds present. Extremities: No pitting edema, distal pulses 2+. Skin: Warm and dry. Musculoskeletal: No kyphosis. Neuropsychiatric: Alert and oriented x3, affect grossly appropriate.  ECG: I personally reviewed the tracing from 12/21/2016 which showed normal sinus rhythm.  Recent Labwork:  August 2016: AST 15, ALT 10, BUN 21, creatinine 0.8, potassium 4.9, cholesterol 213, HDL 44, LDL 149, triglycerides 99  Echocardiogram 02/22/2014: Study  Conclusions  - Left ventricle: The cavity size was normal. Wall thickness was increased in a pattern of mild LVH. Systolic function was normal. The estimated ejection fraction was in the range of 60% to 65%. Wall motion was normal; there were no regional wall motion abnormalities. There was an increased relative contribution of atrial contraction to ventricular filling. Doppler parameters are consistent with abnormal left ventricular relaxation (grade 1 diastolic dysfunction). - Aortic valve: Trileaflet; mildly thickened leaflets. There was no stenosis. - Mitral valve: Mildly thickened leaflets . Mild regurgitation. - Left atrium: The atrium was mildly to moderately dilated. - Tricuspid valve: Mild regurgitation.  Assessment and Plan:  1.  Persistent atrial fibrillation with RVR at present, has baseline history of PAF.  CHADSVASC score is 3.  We discussed anticoagulation for stroke prophylaxis and she agrees to try Eliquis.  Plan to obtain interval lab work from Dr. Melina Copa for review first.  She would stop aspirin when she starts Eliquis.  She will also check with her pharmacy regarding cost and insurance coverage.  Start Toprol-XL 25 mg daily for heart rate control with follow-up nurse check an ECG.  2.  Essential hypertension, on Lotensin and Aldactone.  Current medicines were reviewed with the patient today.   Orders Placed This Encounter  Procedures  . EKG 12-Lead    Disposition: Follow-up for nurse visit and ECG.  Signed, Satira Sark, MD, [facility] 02/16/2018 3:54 PM    [facility] [place], [place], [place] [address] Phone: [phone]; Fax: [phone]

## 2018-02-16 ENCOUNTER — Ambulatory Visit: Payer: Medicare Other | Admitting: Cardiology

## 2018-02-16 ENCOUNTER — Encounter

## 2018-02-16 ENCOUNTER — Encounter: Payer: Self-pay | Admitting: Cardiology

## 2018-02-16 VITALS — BP 144/80 | HR 134 | Ht 62.0 in | Wt 170.0 lb

## 2018-02-16 DIAGNOSIS — I481 Persistent atrial fibrillation: Secondary | ICD-10-CM | POA: Diagnosis not present

## 2018-02-16 DIAGNOSIS — I4819 Other persistent atrial fibrillation: Secondary | ICD-10-CM

## 2018-02-16 DIAGNOSIS — I10 Essential (primary) hypertension: Secondary | ICD-10-CM

## 2018-02-16 MED ORDER — METOPROLOL SUCCINATE ER 25 MG PO TB24: 25.0000 mg | ORAL_TABLET | Freq: Every day | ORAL | 3 refills | Status: DC

## 2018-02-16 NOTE — Patient Instructions (Signed)
Medication Instructions:  Your physician has recommended you make the following change in your medication:   START Toprol xl 25 mg daily  Please continue all other medications as prescribed  Labwork: NONE  Testing/Procedures: ONE  Follow-Up: Your physician recommends that you schedule a follow-up appointment in: 6 WEEKS TO SEE DR. MCDOWELL AND 1 WEEK FOR NURSE VISIT  Any Other Special Instructions Will Be Listed Below (If Applicable).  If you need a refill on your cardiac medications before your next appointment, please call your pharmacy.

## 2018-02-23 ENCOUNTER — Telehealth: Payer: Self-pay

## 2018-02-23 NOTE — Telephone Encounter (Signed)
-----   Message from Acquanetta Chain, LPN sent at [id number]  3:38 PM EDT -----   ----- Message ----- From: Satira Sark, MD Sent: 02/22/2018   1:48 PM To: Merlene Laughter, LPN  Results reviewed.  Hemoglobin and platelets normal.  Renal function normal.  Please see my last note, she is to start Eliquis and stop aspirin. A copy of this test should be forwarded to Octavio Graves, DO.

## 2018-02-23 NOTE — Telephone Encounter (Signed)
Patient notified. Patient does not want to start eliquis at this time and would like a little bit more time to think about it. Routed to PCP

## 2018-02-24 ENCOUNTER — Ambulatory Visit (INDEPENDENT_AMBULATORY_CARE_PROVIDER_SITE_OTHER): Payer: Medicare Other | Admitting: *Deleted

## 2018-02-24 DIAGNOSIS — I4819 Other persistent atrial fibrillation: Secondary | ICD-10-CM

## 2018-02-24 DIAGNOSIS — I481 Persistent atrial fibrillation: Secondary | ICD-10-CM | POA: Diagnosis not present

## 2018-02-24 NOTE — Progress Notes (Addendum)
Will forward to Dr Harl Bowie as well with Dr Domenic Polite and Dr Bronson Ing out of office tomorrow

## 2018-02-24 NOTE — Progress Notes (Signed)
Pt here for repeat EKG with start of Toprol XL 25 mg daily - pt says she has been compliant with medication - denies any symptoms or complaints today - BP today is 122/78 HR 89 on EKG - pt concerned about starting Eliquis and if she could still take Advil for headaches occasionally - also wanted to know if she should re start  spironolactone (says Dr Orlena Sheldon stopped this in February) we still had this listed on her medication list - pt willing to start Eliquis - per LOV and recent lab results Dr Domenic Polite wanted pt to start Eliquis and will forward to confirm dose. EKG in pt chart and will forward to provider

## 2018-02-27 NOTE — Progress Notes (Signed)
ECG shows rate controlled atrial fibrillation on Toprol XL. Lab work already reviewed. Would start Eliquis 5 mg BID and stop ASA. Stay off spironolactone for now.

## 2018-02-28 ENCOUNTER — Telehealth: Payer: Self-pay | Admitting: Cardiology

## 2018-02-28 NOTE — Progress Notes (Signed)
Pt aware and voiced understanding however refuses to try Eliquis (sides affects and cost) pt aware that is recommended by Dr Domenic Polite for stroke prevention with Afib - pt says she will discuss with Dr Domenic Polite at upcoming appt but at this time declines to start Eliquis

## 2018-02-28 NOTE — Telephone Encounter (Signed)
See nurse visit

## 2018-02-28 NOTE — Telephone Encounter (Signed)
Patient returned call

## 2018-03-17 ENCOUNTER — Other Ambulatory Visit: Payer: Self-pay | Admitting: Cardiology

## 2018-04-05 NOTE — Progress Notes (Signed)
Cardiology Office Note  Date: 04/06/2018   ID: Tara Floyd, DOB July 04, 1946, MRN [id number]  PCP: Tara Graves, DO  Primary Cardiologist: Tara Lesches, MD   Chief Complaint  Patient presents with  . Atrial Fibrillation    History of Present Illness: Tara Floyd is a 72 y.o. female last seen in May.  At that time she was noted to be in atrial fibrillation, she was started on Toprol-XL for heart rate control and it was also recommended that she change from aspirin to Eliquis for stroke prophylaxis and CHADSVASC score of 3.  I reviewed her lab work from Dr. Melina Floyd.  She subsequently declined to start anticoagulation.  She presents today stating that she feels better, heart rate control is also better, checked at 88 at rest by me.  She is tolerating Toprol-XL 25 mg daily.  She has resumed aspirin 81 mg daily and does not want to start an anticoagulant despite further discussion today about stroke risk.  She will be seeing Dr. Melina Floyd later this summer.  Past Medical History:  Diagnosis Date  . Allergic rhinitis   . Depression   . DJD (degenerative joint disease)   . Essential hypertension, benign   . PAF (paroxysmal atrial fibrillation) ([place])     Past Surgical History:  Procedure Laterality Date  . DILATION AND CURETTAGE OF UTERUS    . TUBAL LIGATION      Current Outpatient Medications  Medication Sig Dispense Refill  . ALPRAZolam (XANAX) 0.5 MG tablet Take 0.5 mg by mouth as needed.     Marland Kitchen aspirin 81 MG tablet Take 81 mg by mouth daily.    . benazepril (LOTENSIN) 40 MG tablet Take 1 tablet (40 mg total) by mouth daily.    . diclofenac sodium (VOLTAREN) 1 % GEL Apply 1 application topically 2 (two) times daily as needed.    . fluticasone (FLONASE) 50 MCG/ACT nasal spray Place 2 sprays into both nostrils as needed.     Marland Kitchen ibuprofen (ADVIL,MOTRIN) 200 MG tablet Take 800 mg by mouth every 6 (six) hours as needed.    . metoprolol succinate (TOPROL-XL) 25 MG 24 hr tablet TAKE  ONE (1) TABLET EACH DAY 90 tablet 0   No current facility-administered medications for this visit.    Allergies:  Codeine phosphate   Social History: The patient  reports that she quit smoking about 28 years ago. Her smoking use included cigarettes. She started smoking about 56 years ago. She has a 13.50 pack-year smoking history. She has never used smokeless tobacco. She reports that she does not drink alcohol or use drugs.   ROS:  Please see the history of present illness. Otherwise, complete review of systems is positive for recent URI.  All other systems are reviewed and negative.   Physical Exam: VS:  BP 130/80   Pulse 99   Ht 5\' 3"  (1.6 m)   Wt 164 lb (74.4 kg)   SpO2 98%   BMI 29.05 kg/m , BMI Body mass index is 29.05 kg/m.  Wt Readings from Last 3 Encounters:  04/06/18 164 lb (74.4 kg)  02/16/18 170 lb (77.1 kg)  12/21/16 175 lb (79.4 kg)    General: Patient appears comfortable at rest. HEENT: Conjunctiva and lids normal, oropharynx clear. Neck: Supple, no elevated JVP or carotid bruits, no thyromegaly. Lungs: Clear to auscultation, nonlabored breathing at rest. Cardiac: Irregularly irregular, no S3 or significant systolic murmur, no pericardial rub. Abdomen: Soft, nontender, bowel sounds present. Extremities: No  pitting edema, distal pulses 2+.  ECG: I personally reviewed the tracing from 02/24/2018 which showed atrial fibrillation with poor R wave progression and nonspecific ST-T changes.  Recent Labwork:  February 2019: Hemoglobin 13.8, platelets 254, cholesterol 191, triglycerides 87, HDL 45, LDL 129, BUN 15, creatinine 0.9, potassium 5.6, AST 18, ALT 15  Other Studies Reviewed Today:  Echocardiogram 02/22/2014: Study Conclusions  - Left ventricle: The cavity size was normal. Wall thickness was increased in a pattern of mild LVH. Systolic function was normal. The estimated ejection fraction was in the range of 60% to 65%. Wall motion was normal; there  were no regional wall motion abnormalities. There was an increased relative contribution of atrial contraction to ventricular filling. Doppler parameters are consistent with abnormal left ventricular relaxation (grade 1 diastolic dysfunction). - Aortic valve: Trileaflet; mildly thickened leaflets. There was no stenosis. - Mitral valve: Mildly thickened leaflets . Mild regurgitation. - Left atrium: The atrium was mildly to moderately dilated. - Tricuspid valve: Mild regurgitation.  Assessment and Plan:  1.  Persistent atrial fibrillation with CHADSVASC score of 3.  As noted above she has declined anticoagulation despite further discussion and plans to continue on aspirin 81 mg daily for now.  I have asked her to consider this further, we had discussed transition to Eliquis previously.  Continue Toprol-XL at current dose with adequate heart rate control at this point.  2.  Essential hypertension, otherwise no changes to current regimen and keep follow-up with Dr. Melina Floyd.  Current medicines were reviewed with the patient today.  Disposition: Follow-up in 6 months.  Signed, Tara Sark, MD, [facility] 04/06/2018 9:19 AM    [facility] [place], [place], [place] [address] Phone: [phone]; Fax: [phone]

## 2018-04-06 ENCOUNTER — Ambulatory Visit: Payer: Medicare Other | Admitting: Cardiology

## 2018-04-06 ENCOUNTER — Telehealth: Payer: Self-pay | Admitting: Cardiology

## 2018-04-06 ENCOUNTER — Encounter: Payer: Self-pay | Admitting: Cardiology

## 2018-04-06 VITALS — BP 130/80 | HR 99 | Ht 63.0 in | Wt 164.0 lb

## 2018-04-06 DIAGNOSIS — I481 Persistent atrial fibrillation: Secondary | ICD-10-CM | POA: Diagnosis not present

## 2018-04-06 DIAGNOSIS — I10 Essential (primary) hypertension: Secondary | ICD-10-CM | POA: Diagnosis not present

## 2018-04-06 DIAGNOSIS — I4819 Other persistent atrial fibrillation: Secondary | ICD-10-CM

## 2018-04-06 NOTE — Telephone Encounter (Signed)
Pt say per conversation with Dr Domenic Polite this morning that she could increase Toprol XL 50 mg daily to better control HR - wanted to let Dr Domenic Polite that she thought about it and wanted to know if she could go ahead and try 50 mg and continue to monitor HR

## 2018-04-06 NOTE — Telephone Encounter (Signed)
Patient called wanting to know if she can go on the 50 mg of Metoprolol .

## 2018-04-06 NOTE — Telephone Encounter (Signed)
The conversation we had was that Toprol-XL could ultimately be increased if it was necessary, however her heart rate when I checked it this morning was in the mid 80s at rest.  It is not clear that we need to make a definite change at this particular time.

## 2018-04-06 NOTE — Telephone Encounter (Signed)
Pt aware and will continue Toprol XL 25 mg

## 2018-04-06 NOTE — Patient Instructions (Signed)
Medication Instructions:  Your physician has recommended you make the following change in your medication:   STOP Eliquis  Please continue all other medications as prescribed  Labwork: NONE  Testing/Procedures: NONE  Follow-Up: Your physician wants you to follow-up in: [place]. You will receive a reminder letter in the mail two months in advance. If you don't receive a letter, please call our office to schedule the follow-up appointment.  Any Other Special Instructions Will Be Listed Below (If Applicable).  If you need a refill on your cardiac medications before your next appointment, please call your pharmacy.

## 2018-05-15 ENCOUNTER — Other Ambulatory Visit: Payer: Self-pay

## 2018-05-15 ENCOUNTER — Emergency Department (HOSPITAL_COMMUNITY)
Admission: EM | Admit: 2018-05-15 | Discharge: 2018-05-16 | Disposition: A | Discharge status: Home / Self Care | Payer: Medicare Other | Source: Home / Self Care | Attending: Emergency Medicine | Admitting: Emergency Medicine

## 2018-05-15 ENCOUNTER — Encounter (HOSPITAL_COMMUNITY): Payer: Self-pay | Admitting: Emergency Medicine

## 2018-05-15 ENCOUNTER — Emergency Department (HOSPITAL_COMMUNITY): Payer: Medicare Other

## 2018-05-15 DIAGNOSIS — Z87891 Personal history of nicotine dependence: Secondary | ICD-10-CM

## 2018-05-15 DIAGNOSIS — K810 Acute cholecystitis: Secondary | ICD-10-CM | POA: Diagnosis not present

## 2018-05-15 DIAGNOSIS — Z7982 Long term (current) use of aspirin: Secondary | ICD-10-CM

## 2018-05-15 DIAGNOSIS — I482 Chronic atrial fibrillation, unspecified: Secondary | ICD-10-CM

## 2018-05-15 DIAGNOSIS — K8689 Other specified diseases of pancreas: Secondary | ICD-10-CM | POA: Insufficient documentation

## 2018-05-15 DIAGNOSIS — D1803 Hemangioma of intra-abdominal structures: Secondary | ICD-10-CM

## 2018-05-15 DIAGNOSIS — Z79899 Other long term (current) drug therapy: Secondary | ICD-10-CM

## 2018-05-15 DIAGNOSIS — I10 Essential (primary) hypertension: Secondary | ICD-10-CM | POA: Insufficient documentation

## 2018-05-15 DIAGNOSIS — K8020 Calculus of gallbladder without cholecystitis without obstruction: Secondary | ICD-10-CM | POA: Insufficient documentation

## 2018-05-15 DIAGNOSIS — K869 Disease of pancreas, unspecified: Secondary | ICD-10-CM

## 2018-05-15 DIAGNOSIS — K8000 Calculus of gallbladder with acute cholecystitis without obstruction: Secondary | ICD-10-CM | POA: Diagnosis not present

## 2018-05-15 DIAGNOSIS — K8050 Calculus of bile duct without cholangitis or cholecystitis without obstruction: Secondary | ICD-10-CM

## 2018-05-15 LAB — CBC WITH DIFFERENTIAL/PLATELET
BASOS ABS: 0 10*3/uL (ref 0.0–0.1)
BASOS PCT: 0 %
EOS ABS: 0.1 10*3/uL (ref 0.0–0.7)
EOS PCT: 0 %
HCT: 38.8 % (ref 36.0–46.0)
Hemoglobin: 12.7 g/dL (ref 12.0–15.0)
LYMPHS PCT: 8 %
Lymphs Abs: 0.9 10*3/uL (ref 0.7–4.0)
MCH: 29.2 pg (ref 26.0–34.0)
MCHC: 32.7 g/dL (ref 30.0–36.0)
MCV: 89.2 fL (ref 78.0–100.0)
MONO ABS: 1.3 10*3/uL — AB (ref 0.1–1.0)
Monocytes Relative: 12 %
Neutro Abs: 9.2 10*3/uL — ABNORMAL HIGH (ref 1.7–7.7)
Neutrophils Relative %: 80 %
PLATELETS: 216 10*3/uL (ref 150–400)
RBC: 4.35 MIL/uL (ref 3.87–5.11)
RDW: 15 % (ref 11.5–15.5)
WBC: 11.5 10*3/uL — AB (ref 4.0–10.5)

## 2018-05-15 LAB — COMPREHENSIVE METABOLIC PANEL
ALT: 15 U/L (ref 0–44)
AST: 16 U/L (ref 15–41)
Albumin: 3.8 g/dL (ref 3.5–5.0)
Alkaline Phosphatase: 50 U/L (ref 38–126)
Anion gap: 8 (ref 5–15)
BUN: 14 mg/dL (ref 8–23)
CHLORIDE: 101 mmol/L (ref 98–111)
CO2: 28 mmol/L (ref 22–32)
CREATININE: 0.77 mg/dL (ref 0.44–1.00)
Calcium: 9.2 mg/dL (ref 8.9–10.3)
GFR calc non Af Amer: >60 mL/min (ref >60)
Glucose, Bld: 125 mg/dL — ABNORMAL HIGH (ref 70–99)
POTASSIUM: 3.8 mmol/L (ref 3.5–5.1)
SODIUM: 137 mmol/L (ref 135–145)
Total Bilirubin: 0.9 mg/dL (ref 0.3–1.2)
Total Protein: 6.9 g/dL (ref 6.5–8.1)

## 2018-05-15 LAB — URINALYSIS, ROUTINE W REFLEX MICROSCOPIC
BACTERIA UA: NONE SEEN
Bilirubin Urine: NEGATIVE
GLUCOSE, UA: NEGATIVE mg/dL
KETONES UR: 5 mg/dL — AB
NITRITE: NEGATIVE
PROTEIN: NEGATIVE mg/dL
Specific Gravity, Urine: 1.01 (ref 1.005–1.030)
pH: 5 (ref 5.0–8.0)

## 2018-05-15 LAB — I-STAT CG4 LACTIC ACID, ED: LACTIC ACID, VENOUS: 0.92 mmol/L (ref 0.5–1.9)

## 2018-05-15 LAB — LIPASE, BLOOD: LIPASE: 31 U/L (ref 11–51)

## 2018-05-15 MED ORDER — SODIUM CHLORIDE 0.9 % IV BOLUS
1000.0000 mL | Freq: Once | INTRAVENOUS | Status: AC
  Administered 2018-05-15: 1000 mL via INTRAVENOUS

## 2018-05-15 MED ORDER — MORPHINE SULFATE (PF) 4 MG/ML IV SOLN
4.0000 mg | Freq: Once | INTRAVENOUS | Status: AC
  Administered 2018-05-15: 4 mg via INTRAVENOUS
  Filled 2018-05-15: qty 1

## 2018-05-15 MED ORDER — ONDANSETRON HCL 4 MG/2ML IJ SOLN
4.0000 mg | Freq: Once | INTRAMUSCULAR | Status: AC
  Administered 2018-05-15: 4 mg via INTRAVENOUS
  Filled 2018-05-15: qty 2

## 2018-05-15 NOTE — ED Provider Notes (Signed)
[facility] EMERGENCY DEPARTMENT Provider Note   CSN: [id number] Arrival date & time: 05/15/18  2042     History   Chief Complaint Chief Complaint  Patient presents with  . Abdominal Pain    HPI Tara Floyd is a 72 y.o. female.  The history is provided by the patient.  She has history of hypertension, atrial fibrillation not on any anticoagulation and comes in with abdominal pain which started yesterday about 4 PM.  Pain is across the upper abdomen, but radiates to the lower abdomen and into the back.  Pain has been getting worse and she currently rates it at 8/10.  Nothing makes it better, nothing makes it worse.  She has tried taking ibuprofen and Pepto-Bismol without relief.  There has been associated nausea and vomiting.  There is momentary improvement after vomiting.  She has had a small bowel movement which has not affected her pain.  She denies fever or chills.  Her only abdominal surgery was tubal ligation.  Past Medical History:  Diagnosis Date  . Allergic rhinitis   . Depression   . DJD (degenerative joint disease)   . Essential hypertension, benign   . PAF (paroxysmal atrial fibrillation) [facility])     Patient Active Problem List   Diagnosis Date Noted  . PAF (paroxysmal atrial fibrillation) ([place]) 03/13/2014  . Essential hypertension, benign 02/08/2014    Past Surgical History:  Procedure Laterality Date  . DILATION AND CURETTAGE OF UTERUS    . TUBAL LIGATION       OB History   None      Home Medications    Prior to Admission medications   Medication Sig Start Date End Date Taking? Authorizing Provider  ALPRAZolam Duanne Moron) 0.5 MG tablet Take 0.5 mg by mouth as needed.     [provider]  aspirin 81 MG tablet Take 81 mg by mouth daily.    [provider]  benazepril (LOTENSIN) 40 MG tablet Take 1 tablet (40 mg total) by mouth daily. 02/14/14   Satira Sark, MD  diclofenac sodium (VOLTAREN) 1 % GEL Apply 1 application topically 2 (two)  times daily as needed. 12/11/16   [provider]  fluticasone (FLONASE) 50 MCG/ACT nasal spray Place 2 sprays into both nostrils as needed.     [provider]  ibuprofen (ADVIL,MOTRIN) 200 MG tablet Take 800 mg by mouth every 6 (six) hours as needed.    [provider]  metoprolol succinate (TOPROL-XL) 25 MG 24 hr tablet TAKE ONE (1) TABLET EACH DAY 03/17/18   Satira Sark, MD    Family History Family History  Problem Relation Age of Onset  . Parkinson's disease Father   . Brain cancer Mother     Social History Social History   Tobacco Use  . Smoking status: Former Smoker    Packs/day: 0.50    Years: 27.00    Pack years: 13.50    Types: Cigarettes    Start date: 10/21/1961    Last attempt to quit: 09/30/1989    Years since quitting: 28.6  . Smokeless tobacco: Never Used  Substance Use Topics  . Alcohol use: No    Alcohol/week: 0.0 oz  . Drug use: No     Allergies   Codeine phosphate   Review of Systems Review of Systems  All other systems reviewed and are negative.    Physical Exam Updated Vital Signs BP 134/84 (BP Location: Left Arm)   Pulse 71   Temp  97.8 F (36.6 C) (Oral)   Ht 5\' 3"  (1.6 m)   Wt 76.2 kg (168 lb)   SpO2 94%   BMI 29.76 kg/m   Physical Exam  Nursing note and vitals reviewed.  72 year old female, resting comfortably and in no acute distress. Vital signs are normal. Oxygen saturation is 94%, which is normal. Head is normocephalic and atraumatic. PERRLA, EOMI. Oropharynx is clear. Neck is nontender and supple without adenopathy or JVD. Back is nontender and there is no CVA tenderness. Lungs are clear without rales, wheezes, or rhonchi. Chest is nontender. Heart has an irregular rhythm without murmur. Abdomen is soft, flat, with mild tenderness diffusely.  There is no rebound or guarding.  There are no masses or hepatosplenomegaly and peristalsis is hypoactive. Extremities have no cyanosis or edema, full  range of motion is present. Skin is warm and dry without rash. Neurologic: Mental status is normal, cranial nerves are intact, there are no motor or sensory deficits.  ED Treatments / Results  Labs (all labs ordered are listed, but only abnormal results are displayed) Labs Reviewed  COMPREHENSIVE METABOLIC PANEL - Abnormal; Notable for the following components:      Result Value   Glucose, Bld 125 (*)    All other components within normal limits  URINALYSIS, ROUTINE W REFLEX MICROSCOPIC - Abnormal; Notable for the following components:   Hgb urine dipstick SMALL (*)    Ketones, ur 5 (*)    Leukocytes, UA SMALL (*)    All other components within normal limits  CBC WITH DIFFERENTIAL/PLATELET - Abnormal; Notable for the following components:   WBC 11.5 (*)    Neutro Abs 9.2 (*)    Monocytes Absolute 1.3 (*)    All other components within normal limits  LIPASE, BLOOD  I-STAT CG4 LACTIC ACID, ED  I-STAT CG4 LACTIC ACID, ED    EKG EKG Interpretation  Date/Time:  Sunday May 15 2018 23:30:06 EDT Ventricular Rate:  99 PR Interval:    QRS Duration: 94 QT Interval:  357 QTC Calculation: 459 R Axis:   -35 Text Interpretation:  Atrial fibrillation Left axis deviation Low voltage, precordial leads Abnormal R-wave progression, late transition Borderline T abnormalities, anterior leads When compared with ECG of 02/24/2018, No significant change was found Confirmed by Delora Fuel ([id number]) on 05/16/2018 12:21:44 AM   Radiology Ct Angio Abd/pel W And/or Wo Contrast  Result Date: 05/16/2018 CLINICAL DATA:  Upper abdominal pain with nausea and vomiting x1 day. Patient vomited 5 times. Pain radiates to the right lower back. EXAM: CTA ABDOMEN AND PELVIS WITHOUT AND WITH CONTRAST TECHNIQUE: Multidetector CT imaging of the abdomen and pelvis was performed using the standard protocol during bolus administration of intravenous contrast. Multiplanar reconstructed images and MIPs were obtained and  reviewed to evaluate the vascular anatomy. CONTRAST:  123mL ISOVUE-370 IOPAMIDOL (ISOVUE-370) INJECTION 76% COMPARISON:  None. FINDINGS: VASCULAR Aorta: Mild-to-moderate atherosclerosis of the abdominal aorta without aneurysm or dissection. No significant stenosis. Celiac: Patent without evidence of aneurysm, dissection, vasculitis or significant stenosis. SMA: Atherosclerosis at the origin of the SMA.  No thrombosis noted. Renals: Atherosclerosis of the origin of the main left renal artery without significant stenosis. A second smaller diminutive left renal artery is noted as well on the left. Single right renal artery is unremarkable. No evidence of fibromuscular dysplasia. No dissection or aneurysm. IMA: Patent Inflow: Patent without evidence of aneurysm, dissection, vasculitis or significant stenosis. Mild-to-moderate atherosclerosis of the common iliac arteries bilaterally. Proximal Outflow: Patent without  stenosis, aneurysm or dissection. The visualized superficial and profunda femoral arteries are patent. Veins: Negative Review of the MIP images confirms the above findings. NON-VASCULAR Lower chest: Cardiomegaly with bibasilar dependent atelectasis. No pericardial effusion. Hepatobiliary: Pericholecystic fluid and gallbladder wall thickening with a 13 mm calculus near the neck of the gallbladder. Findings raise concern for acute cholecystitis. Peripheral puddling of contrast consistent with a hemangioma is noted within the right hepatic lobe measuring 14 mm in maximum dimension. Fatty infiltration is noted adjacent to the falciform. Pancreas: Top-normal pancreatic duct caliber. No acute peripancreatic inflammation. Slightly in homogeneous area of hypo density involving the junction of the pancreatic head and body, series 6/73 is identified, nonspecific possibly related to remote inflammatory change. This area measures 2 cm. Dedicated pancreatic CT or MRI may help for better assessment. Spleen: Normal size  spleen. Adrenals/Urinary Tract: Adrenal glands are unremarkable. Kidneys are normal, without renal calculi, focal lesion, or hydronephrosis. Bladder is unremarkable. Stomach/Bowel: Small hiatal hernia. Decompressed stomach with normal small bowel rotation. No bowel obstruction or inflammation. The distal and terminal ileum are normal. Appendix is normal contrast filled. Scattered colonic diverticulosis without acute diverticulitis is visualized involving the sigmoid colon. Lymphatic: No lymphadenopathy. Reproductive: Uterus is anteverted in appearance and bilateral adnexa are unremarkable. Other: No abdominal wall hernia or abnormality. No abdominopelvic ascites. Musculoskeletal: Degenerative disc disease L1-2 and L2-3. Levoconvex curvature of the upper lumbar spine. No aggressive osseous lesions. IMPRESSION: VASCULAR Aortoiliac and branch vessel atherosclerosis. Patent branch vessels without embolism or significant stenosis. NON-VASCULAR 1. Gallbladder wall thickening with pericholecystic fluid and 13 mm calculus near the neck of the gallbladder. Findings raise concern for acute cholecystitis. 2. 14 mm peripheral puddling of contrast in the right hepatic lobe compatible with a hemangioma. 3. Subtle area of hypodensity involving pancreas at the junction head and body, nonspecific possibly related to stigmata of prior pancreatitis. Subtle lesion is not entirely excluded. This area of hypodensity measures approximately 2 cm. Dedicated nonemergent pancreatic CT or MRI protocol study is suggested. 4. Sigmoid diverticulosis without acute diverticulitis. 5. Levoscoliosis with lumbar spondylosis. Electronically Signed   By: Ashley Royalty M.D.   On: 05/16/2018 01:15    Procedures Procedures   Medications Ordered in ED Medications  sodium chloride 0.9 % bolus 1,000 mL (0 mLs Intravenous Stopped 05/16/18 0123)  morphine 4 MG/ML injection 4 mg (4 mg Intravenous Given 05/15/18 2343)  ondansetron (ZOFRAN) injection 4 mg (4  mg Intravenous Given 05/15/18 2342)  iopamidol (ISOVUE-370) 76 % injection 100 mL (100 mLs Intravenous Contrast Given 05/16/18 0023)     Initial Impression / Assessment and Plan / ED Course  I have reviewed the triage vital signs and the nursing notes.  Pertinent labs & imaging results that were available during my care of the patient were reviewed by me and considered in my medical decision making (see chart for details).  Abdominal pain of uncertain cause.  However, in setting of known atrial fibrillation and not on anticoagulation, need to consider possibility of mesenteric embolism.  Old records are reviewed confirming she is followed by cardiology for atrial fibrillation and not on anticoagulation.  She is being given IV fluids, morphine, ondansetron and will be sent for CT angiogram of the abdomen and pelvis.  She feels significantly better after above-noted treatment.  Pain is now just on the right side of the abdomen.  Exam is repeated, and she has mild tenderness on the right side of the abdomen with a positive Murphy sign.  CT shows  cholelithiasis with suggestion of possible cholelithiasis.  WBC is only minimally elevated.  Lactic acid level is normal.  Patient continues to appear nontoxic and I do not feel she needs to be admitted for IV antibiotics.  She is discharged with prescription for a small number of oxycodone-acetaminophen tablets and a small number of ondansetron tablets.  She is advised to return for worsening pain, fever, vomiting.  She is referred to general surgery for follow-up.  Because of history of atrial fibrillation, may need cardiology consultation prior to surgery, but it is noted that she did see her cardiologist on June 26.  CT scan also showed questionable lesion of the pancreas with recommendation for nonemergent dedicated pancreatic CT scan or MRI scan.  Patient is advised of this recommendation and is referred back to PCP to have that done electively.  CHA2DS2/VAS  Stroke Risk Points  Current as of 42 minutes ago     3 >= 2 Points: High Risk  1 - 1.99 Points: Medium Risk  0 Points: Low Risk    This is the only CHA2DS2/VAS Stroke Risk Points available for the past  year.:  Last Change: N/A     Details    This score determines the patient's risk of having a stroke if the  patient has atrial fibrillation.       Points Metrics  0 Has Congestive Heart Failure:  No    Current as of 42 minutes ago  0 Has Vascular Disease:  No    Current as of 42 minutes ago  1 Has Hypertension:  Yes    Current as of 42 minutes ago  1 Age:  56    Current as of 42 minutes ago  0 Has Diabetes:  No    Current as of 42 minutes ago  0 Had Stroke:  No  Had TIA:  No  Had thromboembolism:  No    Current as of 42 minutes ago  1 Female:  Yes    Current as of 42 minutes ago   Final Clinical Impressions(s) / ED Diagnoses   Final diagnoses:  Biliary colic  Chronic atrial fibrillation ([place])  Lesion of pancreas  Hepatic hemangioma    ED Discharge Orders        Ordered    oxyCODONE-acetaminophen (PERCOCET) 5-325 MG tablet  Every 4 hours PRN     05/16/18 0208    ondansetron (ZOFRAN) 4 MG tablet  Every 8 hours PRN     79/02/40 [id number]       Delora Fuel, MD 32/99/24 [phone]

## 2018-05-15 NOTE — ED Triage Notes (Addendum)
Pt c/o n/v x 1 day, has vomited 5 times, c/o of URQ and ULQ pain that radiates to right lower back

## 2018-05-16 MED ORDER — OXYCODONE-ACETAMINOPHEN 5-325 MG PO TABS: 1.0000 | ORAL_TABLET | ORAL | 0 refills | Status: DC | PRN

## 2018-05-16 MED ORDER — IOPAMIDOL (ISOVUE-370) INJECTION 76%
100.0000 mL | Freq: Once | INTRAVENOUS | Status: AC | PRN
  Administered 2018-05-16: 100 mL via INTRAVENOUS

## 2018-05-16 MED ORDER — ONDANSETRON HCL 4 MG PO TABS: 4.0000 mg | ORAL_TABLET | Freq: Four times a day (QID) | ORAL | 0 refills | Status: DC | PRN

## 2018-05-16 MED ORDER — ONDANSETRON HCL 4 MG PO TABS: 4.0000 mg | ORAL_TABLET | Freq: Three times a day (TID) | ORAL | 0 refills | Status: DC | PRN

## 2018-05-16 NOTE — ED Notes (Signed)
Pt and family updated on plan of care,  

## 2018-05-16 NOTE — Discharge Instructions (Signed)
Your CT scan showed a gallstone with some inflammation in the gallbladder.  Your gallbladder will need to be removed.  Please make an appointment with the surgeon as soon as possible.  The surgeon may want your cardiologist to state if there would be any problems with surgery.  Return to the emergency department if pain is not being adequately controlled, your vomiting in spite of the nausea medicine, or if you start running a fever.  Your CT scan also showed a possible lesion of your pancreas and recommended either dedicated pancreas CT scan or MRI scan.  Your primary care provider can set this up.

## 2018-05-16 NOTE — ED Notes (Signed)
Patient ambulated to restroom with standby assistance. Patient with unsteady gait. Patient returned to room

## 2018-05-16 NOTE — ED Notes (Signed)
ED Provider at bedside. 

## 2018-05-17 ENCOUNTER — Inpatient Hospital Stay (HOSPITAL_COMMUNITY)
Admission: EM | Admit: 2018-05-17 | Discharge: 2018-05-21 | DRG: 417 | Disposition: A | Discharge status: Home / Self Care | Payer: Medicare Other | Attending: Internal Medicine | Admitting: Internal Medicine

## 2018-05-17 ENCOUNTER — Encounter (HOSPITAL_COMMUNITY): Payer: Self-pay | Admitting: Emergency Medicine

## 2018-05-17 ENCOUNTER — Emergency Department (HOSPITAL_COMMUNITY): Payer: Medicare Other

## 2018-05-17 ENCOUNTER — Other Ambulatory Visit: Payer: Self-pay

## 2018-05-17 DIAGNOSIS — I10 Essential (primary) hypertension: Secondary | ICD-10-CM | POA: Diagnosis present

## 2018-05-17 DIAGNOSIS — K567 Ileus, unspecified: Secondary | ICD-10-CM

## 2018-05-17 DIAGNOSIS — Z87891 Personal history of nicotine dependence: Secondary | ICD-10-CM | POA: Diagnosis not present

## 2018-05-17 DIAGNOSIS — Z885 Allergy status to narcotic agent status: Secondary | ICD-10-CM | POA: Diagnosis not present

## 2018-05-17 DIAGNOSIS — K8000 Calculus of gallbladder with acute cholecystitis without obstruction: Principal | ICD-10-CM | POA: Diagnosis present

## 2018-05-17 DIAGNOSIS — K810 Acute cholecystitis: Secondary | ICD-10-CM

## 2018-05-17 DIAGNOSIS — J9601 Acute respiratory failure with hypoxia: Secondary | ICD-10-CM | POA: Diagnosis not present

## 2018-05-17 DIAGNOSIS — I481 Persistent atrial fibrillation: Secondary | ICD-10-CM | POA: Diagnosis present

## 2018-05-17 DIAGNOSIS — Z7982 Long term (current) use of aspirin: Secondary | ICD-10-CM | POA: Diagnosis not present

## 2018-05-17 DIAGNOSIS — I4819 Other persistent atrial fibrillation: Secondary | ICD-10-CM

## 2018-05-17 DIAGNOSIS — Z79899 Other long term (current) drug therapy: Secondary | ICD-10-CM | POA: Diagnosis not present

## 2018-05-17 DIAGNOSIS — R0902 Hypoxemia: Secondary | ICD-10-CM

## 2018-05-17 DIAGNOSIS — K821 Hydrops of gallbladder: Secondary | ICD-10-CM | POA: Diagnosis present

## 2018-05-17 DIAGNOSIS — F419 Anxiety disorder, unspecified: Secondary | ICD-10-CM | POA: Diagnosis present

## 2018-05-17 DIAGNOSIS — K9189 Other postprocedural complications and disorders of digestive system: Secondary | ICD-10-CM

## 2018-05-17 LAB — COMPREHENSIVE METABOLIC PANEL
ALBUMIN: 3.3 g/dL — AB (ref 3.5–5.0)
ALT: 15 U/L (ref 0–44)
AST: 17 U/L (ref 15–41)
Alkaline Phosphatase: 46 U/L (ref 38–126)
Anion gap: 6 (ref 5–15)
BUN: 12 mg/dL (ref 8–23)
CHLORIDE: 103 mmol/L (ref 98–111)
CO2: 27 mmol/L (ref 22–32)
CREATININE: 0.67 mg/dL (ref 0.44–1.00)
Calcium: 8.6 mg/dL — ABNORMAL LOW (ref 8.9–10.3)
GFR calc Af Amer: >60 mL/min (ref >60)
GFR calc non Af Amer: >60 mL/min (ref >60)
GLUCOSE: 121 mg/dL — AB (ref 70–99)
POTASSIUM: 3.4 mmol/L — AB (ref 3.5–5.1)
SODIUM: 136 mmol/L (ref 135–145)
Total Bilirubin: 1 mg/dL (ref 0.3–1.2)
Total Protein: 7.1 g/dL (ref 6.5–8.1)

## 2018-05-17 LAB — CBC WITH DIFFERENTIAL/PLATELET
BASOS PCT: 0 %
Basophils Absolute: 0 10*3/uL (ref 0.0–0.1)
Eosinophils Absolute: 0 10*3/uL (ref 0.0–0.7)
Eosinophils Relative: 0 %
HEMATOCRIT: 36 % (ref 36.0–46.0)
HEMOGLOBIN: 11.7 g/dL — AB (ref 12.0–15.0)
LYMPHS ABS: 1 10*3/uL (ref 0.7–4.0)
LYMPHS PCT: 9 %
MCH: 28.9 pg (ref 26.0–34.0)
MCHC: 32.5 g/dL (ref 30.0–36.0)
MCV: 88.9 fL (ref 78.0–100.0)
MONO ABS: 1.5 10*3/uL — AB (ref 0.1–1.0)
MONOS PCT: 14 %
NEUTROS ABS: 8.8 10*3/uL — AB (ref 1.7–7.7)
NEUTROS PCT: 77 %
Platelets: 195 10*3/uL (ref 150–400)
RBC: 4.05 MIL/uL (ref 3.87–5.11)
RDW: 14.8 % (ref 11.5–15.5)
WBC: 11.3 10*3/uL — ABNORMAL HIGH (ref 4.0–10.5)

## 2018-05-17 LAB — URINALYSIS, ROUTINE W REFLEX MICROSCOPIC
Bilirubin Urine: NEGATIVE
Glucose, UA: NEGATIVE mg/dL
KETONES UR: 5 mg/dL — AB
Leukocytes, UA: NEGATIVE
Nitrite: NEGATIVE
PH: 6 (ref 5.0–8.0)
Protein, ur: NEGATIVE mg/dL
Specific Gravity, Urine: 1.003 — ABNORMAL LOW (ref 1.005–1.030)

## 2018-05-17 LAB — LIPASE, BLOOD: LIPASE: 27 U/L (ref 11–51)

## 2018-05-17 LAB — SURGICAL PCR SCREEN
MRSA, PCR: NEGATIVE
Staphylococcus aureus: NEGATIVE

## 2018-05-17 LAB — LACTIC ACID, PLASMA: Lactic Acid, Venous: 0.7 mmol/L (ref 0.5–1.9)

## 2018-05-17 MED ORDER — SODIUM CHLORIDE 0.9 % IV SOLN
2.0000 g | INTRAVENOUS | Status: AC
  Administered 2018-05-18: 2 g via INTRAVENOUS
  Filled 2018-05-17 (×2): qty 2

## 2018-05-17 MED ORDER — MORPHINE SULFATE (PF) 4 MG/ML IV SOLN
4.0000 mg | INTRAVENOUS | Status: DC | PRN
  Administered 2018-05-17: 4 mg via INTRAVENOUS
  Filled 2018-05-17: qty 1

## 2018-05-17 MED ORDER — ASPIRIN 81 MG PO CHEW
81.0000 mg | CHEWABLE_TABLET | Freq: Every day | ORAL | Status: DC
  Administered 2018-05-18 – 2018-05-21 (×4): 81 mg via ORAL
  Filled 2018-05-17 (×4): qty 1

## 2018-05-17 MED ORDER — POTASSIUM CHLORIDE IN NACL 20-0.9 MEQ/L-% IV SOLN
INTRAVENOUS | Status: DC
  Administered 2018-05-17 – 2018-05-21 (×7): via INTRAVENOUS

## 2018-05-17 MED ORDER — PIPERACILLIN-TAZOBACTAM 3.375 G IVPB
3.3750 g | Freq: Three times a day (TID) | INTRAVENOUS | Status: DC
  Administered 2018-05-17 – 2018-05-21 (×12): 3.375 g via INTRAVENOUS
  Filled 2018-05-17 (×12): qty 50

## 2018-05-17 MED ORDER — ACETAMINOPHEN 650 MG RE SUPP: 650.0000 mg | Freq: Four times a day (QID) | RECTAL | Status: DC | PRN

## 2018-05-17 MED ORDER — CHLORHEXIDINE GLUCONATE CLOTH 2 % EX PADS
6.0000 | MEDICATED_PAD | Freq: Once | CUTANEOUS | Status: AC
  Administered 2018-05-17: 6 via TOPICAL

## 2018-05-17 MED ORDER — HEPARIN SODIUM (PORCINE) 5000 UNIT/ML IJ SOLN
5000.0000 [IU] | Freq: Three times a day (TID) | INTRAMUSCULAR | Status: DC
  Administered 2018-05-17 – 2018-05-21 (×10): 5000 [IU] via SUBCUTANEOUS
  Filled 2018-05-17 (×10): qty 1

## 2018-05-17 MED ORDER — ALPRAZOLAM 0.5 MG PO TABS: 0.5000 mg | ORAL_TABLET | Freq: Two times a day (BID) | ORAL | Status: DC | PRN

## 2018-05-17 MED ORDER — ONDANSETRON HCL 4 MG/2ML IJ SOLN
4.0000 mg | Freq: Four times a day (QID) | INTRAMUSCULAR | Status: DC | PRN
  Administered 2018-05-18 – 2018-05-21 (×5): 4 mg via INTRAVENOUS
  Filled 2018-05-17 (×5): qty 2

## 2018-05-17 MED ORDER — ACETAMINOPHEN 325 MG PO TABS: 650.0000 mg | ORAL_TABLET | Freq: Four times a day (QID) | ORAL | Status: DC | PRN

## 2018-05-17 MED ORDER — METOPROLOL SUCCINATE ER 25 MG PO TB24
25.0000 mg | ORAL_TABLET | Freq: Every day | ORAL | Status: DC
  Administered 2018-05-18 – 2018-05-19 (×2): 25 mg via ORAL
  Filled 2018-05-17 (×2): qty 1

## 2018-05-17 MED ORDER — PIPERACILLIN-TAZOBACTAM 3.375 G IVPB 30 MIN
3.3750 g | Freq: Once | INTRAVENOUS | Status: AC
  Administered 2018-05-17: 3.375 g via INTRAVENOUS
  Filled 2018-05-17: qty 50

## 2018-05-17 MED ORDER — MORPHINE SULFATE (PF) 4 MG/ML IV SOLN
4.0000 mg | INTRAVENOUS | Status: DC | PRN
  Administered 2018-05-17 – 2018-05-19 (×7): 4 mg via INTRAVENOUS
  Filled 2018-05-17 (×7): qty 1

## 2018-05-17 MED ORDER — ONDANSETRON HCL 4 MG PO TABS
4.0000 mg | ORAL_TABLET | Freq: Four times a day (QID) | ORAL | Status: DC | PRN
  Administered 2018-05-20: 4 mg via ORAL
  Filled 2018-05-17: qty 1

## 2018-05-17 MED ORDER — CHLORHEXIDINE GLUCONATE CLOTH 2 % EX PADS
6.0000 | MEDICATED_PAD | Freq: Once | CUTANEOUS | Status: AC
  Administered 2018-05-18: 6 via TOPICAL

## 2018-05-17 MED ORDER — BENAZEPRIL HCL 10 MG PO TABS
40.0000 mg | ORAL_TABLET | Freq: Every day | ORAL | Status: DC
  Administered 2018-05-18 – 2018-05-21 (×4): 40 mg via ORAL
  Filled 2018-05-17 (×4): qty 4

## 2018-05-17 MED ORDER — ONDANSETRON HCL 4 MG/2ML IJ SOLN
4.0000 mg | INTRAMUSCULAR | Status: AC | PRN
  Administered 2018-05-17 (×2): 4 mg via INTRAVENOUS
  Filled 2018-05-17 (×2): qty 2

## 2018-05-17 MED FILL — Oxycodone w/ Acetaminophen Tab 5-325 MG: ORAL | Qty: 6 | Status: AC

## 2018-05-17 MED FILL — Ondansetron HCl Tab 4 MG: ORAL | Qty: 4 | Status: AC

## 2018-05-17 NOTE — ED Notes (Signed)
Pt taken to US

## 2018-05-17 NOTE — ED Triage Notes (Signed)
Pt seen Sunday night told she needed GP surgery. Pt reports can't see Dr. Constance Haw until Thursday. Pt reports pain is unbearable.

## 2018-05-17 NOTE — H&P (View-Only) (Signed)
[facility] Consult  Reason for Consult: Acute cholecystitis  Referring Physician:  Dr. Tat   Chief Complaint    Abdominal Pain      Tara Floyd is a 72 y.o. female.  HPI: Tara Floyd is a 72 yo with HTN, PAF not on anticoagulation as she reports she discussed with her cardiologist Dr. McDowell and at this time cannot afford the Eliquis he recommended. She was diagnosed with A fib recently but has had no prior cardiac issues or complaints and has been rate controlled on metoprolol.  She comes in with complaints of new onset of epigastric/ RUQ pain that started a few days prior when she was seen in the ED.  At that time, a CT angiogram was done that demonstrated some possible thickened gallbladder and concerns for cholecystitis, but she was feeling better and opted to be discharged. She started having worsening abdominal pain and nausea/vomiting in the last few days and returned to the hospital.  A US was done today that demonstrated edema and some thickening consistent with the prior findings.   She is doing fair, but is still having pain and some nausea.   Past Medical History:  Diagnosis Date  . Allergic rhinitis   . Depression   . DJD (degenerative joint disease)   . Essential hypertension, benign   . PAF (paroxysmal atrial fibrillation) (HCC)     Past Surgical History:  Procedure Laterality Date  . DILATION AND CURETTAGE OF UTERUS    . TUBAL LIGATION      Family History  Problem Relation Age of Onset  . Parkinson's disease Father   . Brain cancer Mother     Social History   Tobacco Use  . Smoking status: Former Smoker    Packs/day: 0.50    Years: 27.00    Pack years: 13.50    Types: Cigarettes    Start date: 10/21/1961    Last attempt to quit: 09/30/1989    Years since quitting: 28.6  . Smokeless tobacco: Never Used  Substance Use Topics  . Alcohol use: No    Alcohol/week: 0.0 oz  . Drug use: No    Medications:  I have reviewed the patient's  current medications. Prior to Admission:  Medications Prior to Admission  Medication Sig Dispense Refill Last Dose  . ALPRAZolam (XANAX) 0.5 MG tablet Take 0.5 mg by mouth as needed.    05/16/2018 at Unknown time  . aspirin 81 MG tablet Take 81 mg by mouth daily.   05/17/2018 at 630  . benazepril (LOTENSIN) 40 MG tablet Take 1 tablet (40 mg total) by mouth daily.   05/17/2018 at Unknown time  . diclofenac sodium (VOLTAREN) 1 % GEL Apply 1 application topically 2 (two) times daily as needed.   Taking  . fluticasone (FLONASE) 50 MCG/ACT nasal spray Place 2 sprays into both nostrils as needed.    Taking  . ibuprofen (ADVIL,MOTRIN) 200 MG tablet Take 800 mg by mouth every 6 (six) hours as needed.   05/16/2018 at Unknown time  . metoprolol succinate (TOPROL-XL) 25 MG 24 hr tablet TAKE ONE (1) TABLET EACH DAY 90 tablet 0 05/17/2018 at 630  . Omega-3 Fatty Acids (FISH OIL) 1200 MG CAPS Take 1 capsule by mouth daily.   05/16/2018 at Unknown time  . ondansetron (ZOFRAN) 4 MG tablet Take 1 tablet (4 mg total) by mouth every 8 (eight) hours as needed for nausea or vomiting. 4 tablet 0 05/17/2018 at Unknown time  . ondansetron (  ZOFRAN) 4 MG tablet Take 1 tablet (4 mg total) by mouth every 6 (six) hours as needed. 12 tablet 0 Past Week at Unknown time  . oxyCODONE-acetaminophen (PERCOCET) 5-325 MG tablet Take 1 tablet by mouth every 4 (four) hours as needed for moderate pain. 6 tablet 0 05/16/2018 at Unknown time   Scheduled: . [START ON 05/18/2018] aspirin  81 mg Oral Daily  . [START ON 05/18/2018] benazepril  40 mg Oral Daily  . heparin  5,000 Units Subcutaneous Q8H  . [START ON 05/18/2018] metoprolol succinate  25 mg Oral Daily   Continuous: . 0.9 % NaCl with KCl 20 mEq / L 75 mL/hr at 05/17/18 1455  . piperacillin-tazobactam (ZOSYN)  IV     PRN:acetaminophen **OR** acetaminophen, ALPRAZolam, morphine injection, ondansetron **OR** ondansetron (ZOFRAN) IV  Allergies  Allergen Reactions  . Codeine Phosphate Other (See  Comments)    "knocks me out"    ROS:  A comprehensive review of systems was negative except for: Gastrointestinal: positive for abdominal pain, nausea and vomiting  Blood pressure 122/63, pulse 99, temperature 98.8 F (37.1 C), temperature source Oral, resp. rate 20, height 5' 3" (1.6 m), weight 168 lb (76.2 kg), SpO2 93 %. Physical Exam  Constitutional: She is oriented to person, place, and time. She appears well-developed and well-nourished.  HENT:  Head: Normocephalic and atraumatic.  Eyes: Pupils are equal, round, and reactive to light. EOM are normal.  Cardiovascular: Normal rate.  Pulmonary/Chest: Effort normal.  Abdominal: Normal appearance. She exhibits no distension and no mass. There is tenderness in the right upper quadrant and epigastric area. There is no rebound and no guarding.  Musculoskeletal: Normal range of motion.  Neurological: She is alert and oriented to person, place, and time.  Skin: Skin is warm and dry.  Psychiatric: She has a normal mood and affect. Her behavior is normal.  Vitals reviewed.   Results: Results for orders placed or performed during the hospital encounter of 05/17/18 (from the past 48 hour(s))  Urinalysis, Routine w reflex microscopic     Status: Abnormal   Collection Time: 05/17/18  9:15 AM  Result Value Ref Range   Color, Urine STRAW (A) YELLOW   APPearance CLEAR CLEAR   Specific Gravity, Urine 1.003 (L) 1.005 - 1.030   pH 6.0 5.0 - 8.0   Glucose, UA NEGATIVE NEGATIVE mg/dL   Hgb urine dipstick SMALL (A) NEGATIVE   Bilirubin Urine NEGATIVE NEGATIVE   Ketones, ur 5 (A) NEGATIVE mg/dL   Protein, ur NEGATIVE NEGATIVE mg/dL   Nitrite NEGATIVE NEGATIVE   Leukocytes, UA NEGATIVE NEGATIVE   RBC / HPF 0-5 0 - 5 RBC/hpf   WBC, UA 0-5 0 - 5 WBC/hpf   Bacteria, UA RARE (A) NONE SEEN    Comment: Performed at [facility], [address] [place], [place] [address]  Comprehensive metabolic panel     Status: Abnormal   Collection Time: 05/17/18   9:29 AM  Result Value Ref Range   Sodium 136 135 - 145 mmol/L   Potassium 3.4 (L) 3.5 - 5.1 mmol/L   Chloride 103 98 - 111 mmol/L   CO2 27 22 - 32 mmol/L   Glucose, Bld 121 (H) 70 - 99 mg/dL   BUN 12 8 - 23 mg/dL   Creatinine, Ser 0.67 0.44 - 1.00 mg/dL   Calcium 8.6 (L) 8.9 - 10.3 mg/dL   Total Protein 7.1 6.5 - 8.1 g/dL   Albumin 3.3 (L) 3.5 - 5.0 g/dL   AST   17 15 - 41 U/L   ALT 15 0 - 44 U/L   Alkaline Phosphatase 46 38 - 126 U/L   Total Bilirubin 1.0 0.3 - 1.2 mg/dL   GFR calc non Af Amer >60 >60 mL/min   GFR calc Af Amer >60 >60 mL/min    Comment: (NOTE) The eGFR has been calculated using the CKD EPI equation. This calculation has not been validated in all clinical situations. eGFR's persistently <60 mL/min signify possible Chronic Kidney Disease.    Anion gap 6 5 - 15    Comment: Performed at [facility], [address] [place], [place] [address]  Lipase, blood     Status: None   Collection Time: 05/17/18  9:29 AM  Result Value Ref Range   Lipase 27 11 - 51 U/L    Comment: Performed at [facility], [address] [place], [place] [address]  Lactic acid, plasma     Status: None   Collection Time: 05/17/18  9:29 AM  Result Value Ref Range   Lactic Acid, Venous 0.7 0.5 - 1.9 mmol/L    Comment: Performed at [facility], [address] [place], [place] [address]  CBC with Differential     Status: Abnormal   Collection Time: 05/17/18  9:29 AM  Result Value Ref Range   WBC 11.3 (H) 4.0 - 10.5 K/uL   RBC 4.05 3.87 - 5.11 MIL/uL   Hemoglobin 11.7 (L) 12.0 - 15.0 g/dL   HCT 36.0 36.0 - 46.0 %   MCV 88.9 78.0 - 100.0 fL   MCH 28.9 26.0 - 34.0 pg   MCHC 32.5 30.0 - 36.0 g/dL   RDW 14.8 11.5 - 15.5 %   Platelets 195 150 - 400 K/uL   Neutrophils Relative % 77 %   Neutro Abs 8.8 (H) 1.7 - 7.7 K/uL   Lymphocytes Relative 9 %   Lymphs Abs 1.0 0.7 - 4.0 K/uL   Monocytes Relative 14 %   Monocytes Absolute 1.5 (H) 0.1 - 1.0 K/uL   Eosinophils Relative 0 %   Eosinophils  Absolute 0.0 0.0 - 0.7 K/uL   Basophils Relative 0 %   Basophils Absolute 0.0 0.0 - 0.1 K/uL    Comment: Performed at [facility], [address] [place], [place] [address]   Reviewed CT angio from a few days ago an US - large neck stone and some degree of thickening of the gallbladder noted, some edema   Dg Chest 2 View  Result Date: 05/17/2018 CLINICAL DATA:  RUQ pain beginning on 08/03 with N/V. Former smoker. Patient denies chest pain or SOB. EXAM: CHEST - 2 VIEW COMPARISON:  CT of the abdomen and pelvis on 05/16/2018. FINDINGS: The heart is enlarged. There are no focal consolidations. There is RIGHT costophrenic angle blunting. No pulmonary edema. Visualized portion of the UPPER abdomen is unremarkable. IMPRESSION: Cardiomegaly.  No evidence for acute  abnormality. Electronically Signed   By: Elizabeth  Brown M.D.   On: 05/17/2018 10:06   Us Abdomen Limited  Result Date: 05/17/2018 CLINICAL DATA:  Upper abdominal pain with vomiting EXAM: ULTRASOUND ABDOMEN LIMITED RIGHT UPPER QUADRANT COMPARISON:  CT abdomen and pelvis May 16, 2018 FINDINGS: Gallbladder: Within the gallbladder, there are echogenic foci which move and shadow consistent with cholelithiasis. Largest gallstone measures 1.5 cm in length. There is evidence of thickening of the gallbladder wall with apparent gallbladder wall edema. There is no appreciable pericholecystic fluid. There is comet tail type artifact along the anterior gallbladder wall suggesting   that there may be superimposed gallbladder wall inflammation such as adenomyomatosis or cholesterolosis. No sonographic Murphy sign noted by sonographer. Common bile duct: Diameter: 4 mm. No intrahepatic or extrahepatic biliary duct dilatation. Liver: No focal lesion identified. Within normal limits in parenchymal echogenicity. Portal vein is patent on color Doppler imaging with normal direction of blood flow towards the liver. IMPRESSION: Cholelithiasis with gallbladder wall  thickening and apparent gallbladder wall edema. Suspect a degree of cute cholecystitis. There may well be superimposed adenomyomatosis or cholesterolosis in the gallbladder wall given comet tail type artifact anteriorly. Study otherwise unremarkable. Electronically Signed   By: William  Woodruff III M.D.   On: 05/17/2018 09:52   Ct Angio Abd/pel W And/or Wo Contrast  Result Date: 05/16/2018 CLINICAL DATA:  Upper abdominal pain with nausea and vomiting x1 day. Patient vomited 5 times. Pain radiates to the right lower back. EXAM: CTA ABDOMEN AND PELVIS WITHOUT AND WITH CONTRAST TECHNIQUE: Multidetector CT imaging of the abdomen and pelvis was performed using the standard protocol during bolus administration of intravenous contrast. Multiplanar reconstructed images and MIPs were obtained and reviewed to evaluate the vascular anatomy. CONTRAST:  100mL ISOVUE-370 IOPAMIDOL (ISOVUE-370) INJECTION 76% COMPARISON:  None. FINDINGS: VASCULAR Aorta: Mild-to-moderate atherosclerosis of the abdominal aorta without aneurysm or dissection. No significant stenosis. Celiac: Patent without evidence of aneurysm, dissection, vasculitis or significant stenosis. SMA: Atherosclerosis at the origin of the SMA.  No thrombosis noted. Renals: Atherosclerosis of the origin of the main left renal artery without significant stenosis. A second smaller diminutive left renal artery is noted as well on the left. Single right renal artery is unremarkable. No evidence of fibromuscular dysplasia. No dissection or aneurysm. IMA: Patent Inflow: Patent without evidence of aneurysm, dissection, vasculitis or significant stenosis. Mild-to-moderate atherosclerosis of the common iliac arteries bilaterally. Proximal Outflow: Patent without stenosis, aneurysm or dissection. The visualized superficial and profunda femoral arteries are patent. Veins: Negative Review of the MIP images confirms the above findings. NON-VASCULAR Lower chest: Cardiomegaly with  bibasilar dependent atelectasis. No pericardial effusion. Hepatobiliary: Pericholecystic fluid and gallbladder wall thickening with a 13 mm calculus near the neck of the gallbladder. Findings raise concern for acute cholecystitis. Peripheral puddling of contrast consistent with a hemangioma is noted within the right hepatic lobe measuring 14 mm in maximum dimension. Fatty infiltration is noted adjacent to the falciform. Pancreas: Top-normal pancreatic duct caliber. No acute peripancreatic inflammation. Slightly in homogeneous area of hypo density involving the junction of the pancreatic head and body, series 6/73 is identified, nonspecific possibly related to remote inflammatory change. This area measures 2 cm. Dedicated pancreatic CT or MRI may help for better assessment. Spleen: Normal size spleen. Adrenals/Urinary Tract: Adrenal glands are unremarkable. Kidneys are normal, without renal calculi, focal lesion, or hydronephrosis. Bladder is unremarkable. Stomach/Bowel: Small hiatal hernia. Decompressed stomach with normal small bowel rotation. No bowel obstruction or inflammation. The distal and terminal ileum are normal. Appendix is normal contrast filled. Scattered colonic diverticulosis without acute diverticulitis is visualized involving the sigmoid colon. Lymphatic: No lymphadenopathy. Reproductive: Uterus is anteverted in appearance and bilateral adnexa are unremarkable. Other: No abdominal wall hernia or abnormality. No abdominopelvic ascites. Musculoskeletal: Degenerative disc disease L1-2 and L2-3. Levoconvex curvature of the upper lumbar spine. No aggressive osseous lesions. IMPRESSION: VASCULAR Aortoiliac and branch vessel atherosclerosis. Patent branch vessels without embolism or significant stenosis. NON-VASCULAR 1. Gallbladder wall thickening with pericholecystic fluid and 13 mm calculus near the neck of the gallbladder. Findings raise concern for acute cholecystitis. 2. 14 mm   peripheral puddling of  contrast in the right hepatic lobe compatible with a hemangioma. 3. Subtle area of hypodensity involving pancreas at the junction head and body, nonspecific possibly related to stigmata of prior pancreatitis. Subtle lesion is not entirely excluded. This area of hypodensity measures approximately 2 cm. Dedicated nonemergent pancreatic CT or MRI protocol study is suggested. 4. Sigmoid diverticulosis without acute diverticulitis. 5. Levoscoliosis with lumbar spondylosis. Electronically Signed   By: Ashley Royalty M.D.   On: 05/16/2018 01:15     Assessment & Plan:  Tara Floyd is a 72 y.o. female with acute cholecystitis on imaging. Admitted to hospitalist. Has history of A fib but no other cardiac complaints or issues.  -OR as soon as schedule allows for lap cholecystectomy  -Zosyn for now -Can have clear diet, NPO at midnight pending possibility of adding on to schedule tomorrow  -At latest will add on for Thursday AM at 22 -EKG with A fib, CXR some some cardiomegaly   PLAN: I counseled the patient about the indication, risks and benefits of laparoscopic cholecystectomy.  She understands there is a very small chance for bleeding, infection, injury to normal structures (including common bile duct), conversion to open surgery, persistent symptoms, evolution of postcholecystectomy diarrhea, need for secondary interventions, anesthesia reaction, cardiopulmonary issues and other risks not specifically detailed here. I described the expected recovery, the plan for follow-up and the restrictions during the recovery phase.  All questions were answered.   Virl Cagey 05/17/2018, 4:18 PM

## 2018-05-17 NOTE — ED Provider Notes (Signed)
[facility] EMERGENCY DEPARTMENT Provider Note   CSN: [id number] Arrival date & time: 05/17/18  0844     History   Chief Complaint Chief Complaint  Patient presents with  . Abdominal Pain    HPI Tara Floyd is a 72 y.o. female.   Abdominal Pain    Pt was seen at 0910.  Per pt, c/o gradual onset and persistence of constant RUQ abd "pain" for the past 3 days.  Has been associated with multiple intermittent episodes of N/V.  Describes the abd pain as "aching." Pt was evaluated in the ED 2 days ago for her symptoms, dx "GB issue" and referred to General Surgery. Pt states she cannot be seen by the Surgeon for another 2 days.  Denies diarrhea, no fevers, no back pain, no rash, no CP/SOB, no black or blood in stools or emesis.      Past Medical History:  Diagnosis Date  . Allergic rhinitis   . Depression   . DJD (degenerative joint disease)   . Essential hypertension, benign   . PAF (paroxysmal atrial fibrillation) [facility])     Patient Active Problem List   Diagnosis Date Noted  . PAF (paroxysmal atrial fibrillation) ([place]) 03/13/2014  . Essential hypertension, benign 02/08/2014    Past Surgical History:  Procedure Laterality Date  . DILATION AND CURETTAGE OF UTERUS    . TUBAL LIGATION       OB History   None      Home Medications    Prior to Admission medications   Medication Sig Start Date End Date Taking? Authorizing Provider  ALPRAZolam Duanne Moron) 0.5 MG tablet Take 0.5 mg by mouth as needed.     [provider]  aspirin 81 MG tablet Take 81 mg by mouth daily.    [provider]  benazepril (LOTENSIN) 40 MG tablet Take 1 tablet (40 mg total) by mouth daily. 02/14/14   Satira Sark, MD  diclofenac sodium (VOLTAREN) 1 % GEL Apply 1 application topically 2 (two) times daily as needed. 12/11/16   [provider]  fluticasone (FLONASE) 50 MCG/ACT nasal spray Place 2 sprays into both nostrils as needed.     [provider]  ibuprofen  (ADVIL,MOTRIN) 200 MG tablet Take 800 mg by mouth every 6 (six) hours as needed.    [provider]  metoprolol succinate (TOPROL-XL) 25 MG 24 hr tablet TAKE ONE (1) TABLET EACH DAY 03/17/18   Satira Sark, MD  ondansetron (ZOFRAN) 4 MG tablet Take 1 tablet (4 mg total) by mouth every 8 (eight) hours as needed for nausea or vomiting. 12/17/73   Delora Fuel, MD  ondansetron (ZOFRAN) 4 MG tablet Take 1 tablet (4 mg total) by mouth every 6 (six) hours as needed. 03/15/32   Delora Fuel, MD  oxyCODONE-acetaminophen (PERCOCET) 5-325 MG tablet Take 1 tablet by mouth every 4 (four) hours as needed for moderate pain. 11/20/49   Delora Fuel, MD    Family History Family History  Problem Relation Age of Onset  . Parkinson's disease Father   . Brain cancer Mother     Social History Social History   Tobacco Use  . Smoking status: Former Smoker    Packs/day: 0.50    Years: 27.00    Pack years: 13.50    Types: Cigarettes    Start date: 10/21/1961    Last attempt to quit: 09/30/1989    Years since quitting: 28.6  . Smokeless tobacco: Never Used  Substance Use Topics  .  Alcohol use: No    Alcohol/week: 0.0 oz  . Drug use: No     Allergies   Codeine phosphate   Review of Systems Review of Systems  Gastrointestinal: Positive for abdominal pain.  ROS: Statement: All systems negative except as marked or noted in the HPI; Constitutional: Negative for fever and chills. ; ; Eyes: Negative for eye pain, redness and discharge. ; ; ENMT: Negative for ear pain, hoarseness, nasal congestion, sinus pressure and sore throat. ; ; Cardiovascular: Negative for chest pain, palpitations, diaphoresis, dyspnea and peripheral edema. ; ; Respiratory: Negative for cough, wheezing and stridor. ; ; Gastrointestinal: +N/V, abd pain. Negative for diarrhea, blood in stool, hematemesis, jaundice and rectal bleeding. . ; ; Genitourinary: Negative for dysuria, flank pain and hematuria. ; ; Musculoskeletal: Negative  for back pain and neck pain. Negative for swelling and trauma.; ; Skin: Negative for pruritus, rash, abrasions, blisters, bruising and skin lesion.; ; Neuro: Negative for headache, lightheadedness and neck stiffness. Negative for weakness, altered level of consciousness, altered mental status, extremity weakness, paresthesias, involuntary movement, seizure and syncope.      Physical Exam Updated Vital Signs BP (!) 144/83 (BP Location: Right Arm)   Pulse (!) 126 Comment: irregular  Temp 98 F (36.7 C) (Oral)   Resp 16   SpO2 95%    BP 129/81   Pulse (!) 104   Temp 98 F (36.7 C) (Oral)   Resp 16   SpO2 92%    Physical Exam 0915: Physical examination:  Nursing notes reviewed; Vital signs and O2 SAT reviewed;  Constitutional: Well developed, Well nourished, In no acute distress; Head:  Normocephalic, atraumatic; Eyes: EOMI, PERRL, No scleral icterus; ENMT: Mouth and pharynx normal, Mucous membranes dry; Neck: Supple, Full range of motion, No lymphadenopathy; Cardiovascular: Regular rate and rhythm, No gallop; Respiratory: Breath sounds clear & equal bilaterally, No wheezes.  Speaking full sentences with ease, Normal respiratory effort/excursion; Chest: Nontender, Movement normal; Abdomen: Soft, +RUQ and mid-epigastric tenderness to palp. No rebound or guarding. Nondistended, Normal bowel sounds; Genitourinary: No CVA tenderness; Extremities: Peripheral pulses normal, No tenderness, No edema, No calf edema or asymmetry.; Neuro: AA&Ox3, Major CN grossly intact.  Speech clear. No gross focal motor or sensory deficits in extremities.; Skin: Color normal, Warm, Dry.   ED Treatments / Results  Labs (all labs ordered are listed, but only abnormal results are displayed)   EKG None  Radiology   Procedures Procedures (including critical care time)  Medications Ordered in ED Medications  morphine 4 MG/ML injection 4 mg (has no administration in time range)  ondansetron (ZOFRAN) injection  4 mg (has no administration in time range)     Initial Impression / Assessment and Plan / ED Course  I have reviewed the triage vital signs and the nursing notes.  Pertinent labs & imaging results that were available during my care of the patient were reviewed by me and considered in my medical decision making (see chart for details).  MDM Reviewed: previous chart, nursing note and vitals Reviewed previous: labs and CT scan Interpretation: labs, x-ray and ultrasound   Results for orders placed or performed during the hospital encounter of 05/17/18  Comprehensive metabolic panel  Result Value Ref Range   Sodium 136 135 - 145 mmol/L   Potassium 3.4 (L) 3.5 - 5.1 mmol/L   Chloride 103 98 - 111 mmol/L   CO2 27 22 - 32 mmol/L   Glucose, Bld 121 (H) 70 - 99 mg/dL   BUN  12 8 - 23 mg/dL   Creatinine, Ser 0.67 0.44 - 1.00 mg/dL   Calcium 8.6 (L) 8.9 - 10.3 mg/dL   Total Protein 7.1 6.5 - 8.1 g/dL   Albumin 3.3 (L) 3.5 - 5.0 g/dL   AST 17 15 - 41 U/L   ALT 15 0 - 44 U/L   Alkaline Phosphatase 46 38 - 126 U/L   Total Bilirubin 1.0 0.3 - 1.2 mg/dL   GFR calc non Af Amer >60 >60 mL/min   GFR calc Af Amer >60 >60 mL/min   Anion gap 6 5 - 15  Lipase, blood  Result Value Ref Range   Lipase 27 11 - 51 U/L  Lactic acid, plasma  Result Value Ref Range   Lactic Acid, Venous 0.7 0.5 - 1.9 mmol/L  CBC with Differential  Result Value Ref Range   WBC 11.3 (H) 4.0 - 10.5 K/uL   RBC 4.05 3.87 - 5.11 MIL/uL   Hemoglobin 11.7 (L) 12.0 - 15.0 g/dL   HCT 36.0 36.0 - 46.0 %   MCV 88.9 78.0 - 100.0 fL   MCH 28.9 26.0 - 34.0 pg   MCHC 32.5 30.0 - 36.0 g/dL   RDW 14.8 11.5 - 15.5 %   Platelets 195 150 - 400 K/uL   Neutrophils Relative % 77 %   Neutro Abs 8.8 (H) 1.7 - 7.7 K/uL   Lymphocytes Relative 9 %   Lymphs Abs 1.0 0.7 - 4.0 K/uL   Monocytes Relative 14 %   Monocytes Absolute 1.5 (H) 0.1 - 1.0 K/uL   Eosinophils Relative 0 %   Eosinophils Absolute 0.0 0.0 - 0.7 K/uL   Basophils  Relative 0 %   Basophils Absolute 0.0 0.0 - 0.1 K/uL  Urinalysis, Routine w reflex microscopic  Result Value Ref Range   Color, Urine STRAW (A) YELLOW   APPearance CLEAR CLEAR   Specific Gravity, Urine 1.003 (L) 1.005 - 1.030   pH 6.0 5.0 - 8.0   Glucose, UA NEGATIVE NEGATIVE mg/dL   Hgb urine dipstick SMALL (A) NEGATIVE   Bilirubin Urine NEGATIVE NEGATIVE   Ketones, ur 5 (A) NEGATIVE mg/dL   Protein, ur NEGATIVE NEGATIVE mg/dL   Nitrite NEGATIVE NEGATIVE   Leukocytes, UA NEGATIVE NEGATIVE   RBC / HPF 0-5 0 - 5 RBC/hpf   WBC, UA 0-5 0 - 5 WBC/hpf   Bacteria, UA RARE (A) NONE SEEN   Dg Chest 2 View Result Date: 05/17/2018 CLINICAL DATA:  RUQ pain beginning on 08/03 with N/V. Former smoker. Patient denies chest pain or SOB. EXAM: CHEST - 2 VIEW COMPARISON:  CT of the abdomen and pelvis on 05/16/2018. FINDINGS: The heart is enlarged. There are no focal consolidations. There is RIGHT costophrenic angle blunting. No pulmonary edema. Visualized portion of the UPPER abdomen is unremarkable. IMPRESSION: Cardiomegaly.  No evidence for acute  abnormality. Electronically Signed   By: Nolon Nations M.D.   On: 05/17/2018 10:06   US Abdomen Limited Result Date: 05/17/2018 CLINICAL DATA:  Upper abdominal pain with vomiting EXAM: ULTRASOUND ABDOMEN LIMITED RIGHT UPPER QUADRANT COMPARISON:  CT abdomen and pelvis May 16, 2018 FINDINGS: Gallbladder: Within the gallbladder, there are echogenic foci which move and shadow consistent with cholelithiasis. Largest gallstone measures 1.5 cm in length. There is evidence of thickening of the gallbladder wall with apparent gallbladder wall edema. There is no appreciable pericholecystic fluid. There is comet tail type artifact along the anterior gallbladder wall suggesting that there may be superimposed gallbladder wall inflammation  such as adenomyomatosis or cholesterolosis. No sonographic Murphy sign noted by sonographer. Common bile duct: Diameter: 4 mm. No  intrahepatic or extrahepatic biliary duct dilatation. Liver: No focal lesion identified. Within normal limits in parenchymal echogenicity. Portal vein is patent on color Doppler imaging with normal direction of blood flow towards the liver. IMPRESSION: Cholelithiasis with gallbladder wall thickening and apparent gallbladder wall edema. Suspect a degree of cute cholecystitis. There may well be superimposed adenomyomatosis or cholesterolosis in the gallbladder wall given comet tail type artifact anteriorly. Study otherwise unremarkable. Electronically Signed   By: Lowella Grip III M.D.   On: 05/17/2018 09:52    1120:  [place] as above. IV abx started. T/C returned from Generalized Surgery Dr. Constance Haw, case discussed, including:  HPI, pertinent PM/SHx, VS/PE, dx testing, ED course and treatment:  Agreeable to consult, requests to admit to [facility].   1130:  T/C returned from [facility] Dr. Carles Collet, case discussed, including:  HPI, pertinent PM/SHx, VS/PE, dx testing, ED course and treatment:  Agreeable to admit.     Final Clinical Impressions(s) / ED Diagnoses   Final diagnoses:  None    ED Discharge Orders    None       Francine Graven, DO 05/20/18 [id number]

## 2018-05-17 NOTE — Consult Note (Signed)
[facility] Consult  Reason for Consult: Acute cholecystitis  Referring Physician:  Dr. Carles Collet   Chief Complaint    Abdominal Pain      Tara Floyd is a 72 y.o. female.  HPI: Tara Floyd is a 72 yo with HTN, PAF not on anticoagulation as she reports she discussed with her cardiologist Dr. Domenic Polite and at this time cannot afford the Eliquis he recommended. She was diagnosed with A fib recently but has had no prior cardiac issues or complaints and has been rate controlled on metoprolol.  She comes in with complaints of new onset of epigastric/ RUQ pain that started a few days prior when she was seen in the ED.  At that time, a CT angiogram was done that demonstrated some possible thickened gallbladder and concerns for cholecystitis, but she was feeling better and opted to be discharged. She started having worsening abdominal pain and nausea/vomiting in the last few days and returned to the hospital.  A [place] was done today that demonstrated edema and some thickening consistent with the prior findings.   She is doing fair, but is still having pain and some nausea.   Past Medical History:  Diagnosis Date  . Allergic rhinitis   . Depression   . DJD (degenerative joint disease)   . Essential hypertension, benign   . PAF (paroxysmal atrial fibrillation) ([place])     Past Surgical History:  Procedure Laterality Date  . DILATION AND CURETTAGE OF UTERUS    . TUBAL LIGATION      Family History  Problem Relation Age of Onset  . Parkinson's disease Father   . Brain cancer Mother     Social History   Tobacco Use  . Smoking status: Former Smoker    Packs/day: 0.50    Years: 27.00    Pack years: 13.50    Types: Cigarettes    Start date: 10/21/1961    Last attempt to quit: 09/30/1989    Years since quitting: 28.6  . Smokeless tobacco: Never Used  Substance Use Topics  . Alcohol use: No    Alcohol/week: 0.0 oz  . Drug use: No    Medications:  I have reviewed the patient's  current medications. Prior to Admission:  Medications Prior to Admission  Medication Sig Dispense Refill Last Dose  . ALPRAZolam (XANAX) 0.5 MG tablet Take 0.5 mg by mouth as needed.    05/16/2018 at Unknown time  . aspirin 81 MG tablet Take 81 mg by mouth daily.   05/17/2018 at 630  . benazepril (LOTENSIN) 40 MG tablet Take 1 tablet (40 mg total) by mouth daily.   05/17/2018 at Unknown time  . diclofenac sodium (VOLTAREN) 1 % GEL Apply 1 application topically 2 (two) times daily as needed.   Taking  . fluticasone (FLONASE) 50 MCG/ACT nasal spray Place 2 sprays into both nostrils as needed.    Taking  . ibuprofen (ADVIL,MOTRIN) 200 MG tablet Take 800 mg by mouth every 6 (six) hours as needed.   05/16/2018 at Unknown time  . metoprolol succinate (TOPROL-XL) 25 MG 24 hr tablet TAKE ONE (1) TABLET EACH DAY 90 tablet 0 05/17/2018 at 630  . Omega-3 Fatty Acids (FISH OIL) 1200 MG CAPS Take 1 capsule by mouth daily.   05/16/2018 at Unknown time  . ondansetron (ZOFRAN) 4 MG tablet Take 1 tablet (4 mg total) by mouth every 8 (eight) hours as needed for nausea or vomiting. 4 tablet 0 05/17/2018 at Unknown time  . ondansetron (  ZOFRAN) 4 MG tablet Take 1 tablet (4 mg total) by mouth every 6 (six) hours as needed. 12 tablet 0 Past Week at Unknown time  . oxyCODONE-acetaminophen (PERCOCET) 5-325 MG tablet Take 1 tablet by mouth every 4 (four) hours as needed for moderate pain. 6 tablet 0 05/16/2018 at Unknown time   Scheduled: . [START ON 05/18/2018] aspirin  81 mg Oral Daily  . [START ON 05/18/2018] benazepril  40 mg Oral Daily  . heparin  5,000 Units Subcutaneous Q8H  . [START ON 05/18/2018] metoprolol succinate  25 mg Oral Daily   Continuous: . 0.9 % NaCl with KCl 20 mEq / L 75 mL/hr at 05/17/18 1455  . piperacillin-tazobactam (ZOSYN)  IV     ATF:TDDUKGURKYHCW **OR** acetaminophen, ALPRAZolam, morphine injection, ondansetron **OR** ondansetron (ZOFRAN) IV  Allergies  Allergen Reactions  . Codeine Phosphate Other (See  Comments)    "knocks me out"    ROS:  A comprehensive review of systems was negative except for: Gastrointestinal: positive for abdominal pain, nausea and vomiting  Blood pressure 122/63, pulse 99, temperature 98.8 F (37.1 C), temperature source Oral, resp. rate 20, height 5' 3" (1.6 m), weight 168 lb (76.2 kg), SpO2 93 %. Physical Exam  Constitutional: She is oriented to person, place, and time. She appears well-developed and well-nourished.  HENT:  Head: Normocephalic and atraumatic.  Eyes: Pupils are equal, round, and reactive to light. EOM are normal.  Cardiovascular: Normal rate.  Pulmonary/Chest: Effort normal.  Abdominal: Normal appearance. She exhibits no distension and no mass. There is tenderness in the right upper quadrant and epigastric area. There is no rebound and no guarding.  Musculoskeletal: Normal range of motion.  Neurological: She is alert and oriented to person, place, and time.  Skin: Skin is warm and dry.  Psychiatric: She has a normal mood and affect. Her behavior is normal.  Vitals reviewed.   Results: Results for orders placed or performed during the hospital encounter of 05/17/18 (from the past 48 hour(s))  Urinalysis, Routine w reflex microscopic     Status: Abnormal   Collection Time: 05/17/18  9:15 AM  Result Value Ref Range   Color, Urine STRAW (A) YELLOW   APPearance CLEAR CLEAR   Specific Gravity, Urine 1.003 (L) 1.005 - 1.030   pH 6.0 5.0 - 8.0   Glucose, UA NEGATIVE NEGATIVE mg/dL   Hgb urine dipstick SMALL (A) NEGATIVE   Bilirubin Urine NEGATIVE NEGATIVE   Ketones, ur 5 (A) NEGATIVE mg/dL   Protein, ur NEGATIVE NEGATIVE mg/dL   Nitrite NEGATIVE NEGATIVE   Leukocytes, UA NEGATIVE NEGATIVE   RBC / HPF 0-5 0 - 5 RBC/hpf   WBC, UA 0-5 0 - 5 WBC/hpf   Bacteria, UA RARE (A) NONE SEEN    Comment: Performed at [facility], [address], [place], [place] [address]  Comprehensive metabolic panel     Status: Abnormal   Collection Time: 05/17/18   9:29 AM  Result Value Ref Range   Sodium 136 135 - 145 mmol/L   Potassium 3.4 (L) 3.5 - 5.1 mmol/L   Chloride 103 98 - 111 mmol/L   CO2 27 22 - 32 mmol/L   Glucose, Bld 121 (H) 70 - 99 mg/dL   BUN 12 8 - 23 mg/dL   Creatinine, Ser 0.67 0.44 - 1.00 mg/dL   Calcium 8.6 (L) 8.9 - 10.3 mg/dL   Total Protein 7.1 6.5 - 8.1 g/dL   Albumin 3.3 (L) 3.5 - 5.0 g/dL   AST  17 15 - 41 U/L   ALT 15 0 - 44 U/L   Alkaline Phosphatase 46 38 - 126 U/L   Total Bilirubin 1.0 0.3 - 1.2 mg/dL   GFR calc non Af Amer >60 >60 mL/min   GFR calc Af Amer >60 >60 mL/min    Comment: (NOTE) The eGFR has been calculated using the CKD EPI equation. This calculation has not been validated in all clinical situations. eGFR's persistently <60 mL/min signify possible Chronic Kidney Disease.    Anion gap 6 5 - 15    Comment: Performed at [facility], [address], [place], [place] [address]  Lipase, blood     Status: None   Collection Time: 05/17/18  9:29 AM  Result Value Ref Range   Lipase 27 11 - 51 U/L    Comment: Performed at [facility], [address], [place], [place] [address]  Lactic acid, plasma     Status: None   Collection Time: 05/17/18  9:29 AM  Result Value Ref Range   Lactic Acid, Venous 0.7 0.5 - 1.9 mmol/L    Comment: Performed at [facility], [address], [place], [place] [address]  CBC with Differential     Status: Abnormal   Collection Time: 05/17/18  9:29 AM  Result Value Ref Range   WBC 11.3 (H) 4.0 - 10.5 K/uL   RBC 4.05 3.87 - 5.11 MIL/uL   Hemoglobin 11.7 (L) 12.0 - 15.0 g/dL   HCT 36.0 36.0 - 46.0 %   MCV 88.9 78.0 - 100.0 fL   MCH 28.9 26.0 - 34.0 pg   MCHC 32.5 30.0 - 36.0 g/dL   RDW 14.8 11.5 - 15.5 %   Platelets 195 150 - 400 K/uL   Neutrophils Relative % 77 %   Neutro Abs 8.8 (H) 1.7 - 7.7 K/uL   Lymphocytes Relative 9 %   Lymphs Abs 1.0 0.7 - 4.0 K/uL   Monocytes Relative 14 %   Monocytes Absolute 1.5 (H) 0.1 - 1.0 K/uL   Eosinophils Relative 0 %   Eosinophils  Absolute 0.0 0.0 - 0.7 K/uL   Basophils Relative 0 %   Basophils Absolute 0.0 0.0 - 0.1 K/uL    Comment: Performed at [facility], [address] [place], [place] [address]   Reviewed CT angio from a few days ago an [place] - large neck stone and some degree of thickening of the gallbladder noted, some edema   Dg Chest 2 View  Result Date: 05/17/2018 CLINICAL DATA:  RUQ pain beginning on 08/03 with N/V. Former smoker. Patient denies chest pain or SOB. EXAM: CHEST - 2 VIEW COMPARISON:  CT of the abdomen and pelvis on 05/16/2018. FINDINGS: The heart is enlarged. There are no focal consolidations. There is RIGHT costophrenic angle blunting. No pulmonary edema. Visualized portion of the UPPER abdomen is unremarkable. IMPRESSION: Cardiomegaly.  No evidence for acute  abnormality. Electronically Signed   By: Nolon Nations M.D.   On: 05/17/2018 10:06   US Abdomen Limited  Result Date: 05/17/2018 CLINICAL DATA:  Upper abdominal pain with vomiting EXAM: ULTRASOUND ABDOMEN LIMITED RIGHT UPPER QUADRANT COMPARISON:  CT abdomen and pelvis May 16, 2018 FINDINGS: Gallbladder: Within the gallbladder, there are echogenic foci which move and shadow consistent with cholelithiasis. Largest gallstone measures 1.5 cm in length. There is evidence of thickening of the gallbladder wall with apparent gallbladder wall edema. There is no appreciable pericholecystic fluid. There is comet tail type artifact along the anterior gallbladder wall suggesting  that there may be superimposed gallbladder wall inflammation such as adenomyomatosis or cholesterolosis. No sonographic Murphy sign noted by sonographer. Common bile duct: Diameter: 4 mm. No intrahepatic or extrahepatic biliary duct dilatation. Liver: No focal lesion identified. Within normal limits in parenchymal echogenicity. Portal vein is patent on color Doppler imaging with normal direction of blood flow towards the liver. IMPRESSION: Cholelithiasis with gallbladder wall  thickening and apparent gallbladder wall edema. Suspect a degree of cute cholecystitis. There may well be superimposed adenomyomatosis or cholesterolosis in the gallbladder wall given comet tail type artifact anteriorly. Study otherwise unremarkable. Electronically Signed   By: Lowella Grip III M.D.   On: 05/17/2018 09:52   Ct Angio Abd/pel W And/or Wo Contrast  Result Date: 05/16/2018 CLINICAL DATA:  Upper abdominal pain with nausea and vomiting x1 day. Patient vomited 5 times. Pain radiates to the right lower back. EXAM: CTA ABDOMEN AND PELVIS WITHOUT AND WITH CONTRAST TECHNIQUE: Multidetector CT imaging of the abdomen and pelvis was performed using the standard protocol during bolus administration of intravenous contrast. Multiplanar reconstructed images and MIPs were obtained and reviewed to evaluate the vascular anatomy. CONTRAST:  164m ISOVUE-370 IOPAMIDOL (ISOVUE-370) INJECTION 76% COMPARISON:  None. FINDINGS: VASCULAR Aorta: Mild-to-moderate atherosclerosis of the abdominal aorta without aneurysm or dissection. No significant stenosis. Celiac: Patent without evidence of aneurysm, dissection, vasculitis or significant stenosis. SMA: Atherosclerosis at the origin of the SMA.  No thrombosis noted. Renals: Atherosclerosis of the origin of the main left renal artery without significant stenosis. A second smaller diminutive left renal artery is noted as well on the left. Single right renal artery is unremarkable. No evidence of fibromuscular dysplasia. No dissection or aneurysm. IMA: Patent Inflow: Patent without evidence of aneurysm, dissection, vasculitis or significant stenosis. Mild-to-moderate atherosclerosis of the common iliac arteries bilaterally. Proximal Outflow: Patent without stenosis, aneurysm or dissection. The visualized superficial and profunda femoral arteries are patent. Veins: Negative Review of the MIP images confirms the above findings. NON-VASCULAR Lower chest: Cardiomegaly with  bibasilar dependent atelectasis. No pericardial effusion. Hepatobiliary: Pericholecystic fluid and gallbladder wall thickening with a 13 mm calculus near the neck of the gallbladder. Findings raise concern for acute cholecystitis. Peripheral puddling of contrast consistent with a hemangioma is noted within the right hepatic lobe measuring 14 mm in maximum dimension. Fatty infiltration is noted adjacent to the falciform. Pancreas: Top-normal pancreatic duct caliber. No acute peripancreatic inflammation. Slightly in homogeneous area of hypo density involving the junction of the pancreatic head and body, series 6/73 is identified, nonspecific possibly related to remote inflammatory change. This area measures 2 cm. Dedicated pancreatic CT or MRI may help for better assessment. Spleen: Normal size spleen. Adrenals/Urinary Tract: Adrenal glands are unremarkable. Kidneys are normal, without renal calculi, focal lesion, or hydronephrosis. Bladder is unremarkable. Stomach/Bowel: Small hiatal hernia. Decompressed stomach with normal small bowel rotation. No bowel obstruction or inflammation. The distal and terminal ileum are normal. Appendix is normal contrast filled. Scattered colonic diverticulosis without acute diverticulitis is visualized involving the sigmoid colon. Lymphatic: No lymphadenopathy. Reproductive: Uterus is anteverted in appearance and bilateral adnexa are unremarkable. Other: No abdominal wall hernia or abnormality. No abdominopelvic ascites. Musculoskeletal: Degenerative disc disease L1-2 and L2-3. Levoconvex curvature of the upper lumbar spine. No aggressive osseous lesions. IMPRESSION: VASCULAR Aortoiliac and branch vessel atherosclerosis. Patent branch vessels without embolism or significant stenosis. NON-VASCULAR 1. Gallbladder wall thickening with pericholecystic fluid and 13 mm calculus near the neck of the gallbladder. Findings raise concern for acute cholecystitis. 2. 14 mm  peripheral puddling of  contrast in the right hepatic lobe compatible with a hemangioma. 3. Subtle area of hypodensity involving pancreas at the junction head and body, nonspecific possibly related to stigmata of prior pancreatitis. Subtle lesion is not entirely excluded. This area of hypodensity measures approximately 2 cm. Dedicated nonemergent pancreatic CT or MRI protocol study is suggested. 4. Sigmoid diverticulosis without acute diverticulitis. 5. Levoscoliosis with lumbar spondylosis. Electronically Signed   By: Ashley Royalty M.D.   On: 05/16/2018 01:15     Assessment & Plan:  Tara Floyd is a 72 y.o. female with acute cholecystitis on imaging. Admitted to hospitalist. Has history of A fib but no other cardiac complaints or issues.  -OR as soon as schedule allows for lap cholecystectomy  -Zosyn for now -Can have clear diet, NPO at midnight pending possibility of adding on to schedule tomorrow  -At latest will add on for Thursday AM at 11 -EKG with A fib, CXR some some cardiomegaly   PLAN: I counseled the patient about the indication, risks and benefits of laparoscopic cholecystectomy.  She understands there is a very small chance for bleeding, infection, injury to normal structures (including common bile duct), conversion to open surgery, persistent symptoms, evolution of postcholecystectomy diarrhea, need for secondary interventions, anesthesia reaction, cardiopulmonary issues and other risks not specifically detailed here. I described the expected recovery, the plan for follow-up and the restrictions during the recovery phase.  All questions were answered.   Virl Cagey 05/17/2018, 4:18 PM

## 2018-05-17 NOTE — H&P (Signed)
History and Physical  Tara Floyd WVP:[id number] DOB: [birth year] DOA: 05/17/2018   PCP: Octavio Graves, DO   Patient coming from: Home  Chief Complaint: vomiting and abdominal pain  HPI:  Tara Floyd is a 72 y.o. female with medical history of persistent atrial fibrillation and hypertension presenting with nausea, vomiting, and abdominal pain that began on 05/14/2018.  The patient visited the emergency department on 05/15/2018.  She was treated symptomatically.  CT Angio of the abdomen and pelvis was performed at that time and showed gallbladder wall thickening with pericholecystic fluid and a calculus near the neck of the gallbladder.  There was concerns for acute cholecystitis.  She was offered admission, but the patient did not want to stay in the hospital.  She was discharged in stable condition.  Unfortunately, the patient began having nausea and vomiting with worsening abdominal pain once again once she woke up on the morning of 05/16/2018.  The patient could not wait until her appointment with general surgery.  As result, she represented to the hospital for further evaluation.  She denies any fevers, chills, chest pain, shortness breath, diarrhea, hematochezia, melena. In the emergency department, the patient was afebrile and hemodynamically stable saturating 95% on room air.  Heart rate was 100-110.  The patient was given Zosyn, IV fluids, and antiemetics and morphine.  Assessment/Plan: Acute cholecystitis -Continue Zosyn -Clear liquid diet for now until she is seen by general surgery -General surgery consult -IV fluids -IV antiemetics and pain management -8/6 RUQ US--cholelithiasis with gallbladder wall thickening and edema  Persistent atrial fibrillation -The patient has refused anticoagulation -Continue aspirin -CHADSVASc = 3 -Continue metoprolol succinate  Essential hypertension -Continue metoprolol succinate and benazepril  Anxiety -Continue home dose alprazolam            Past Medical History:  Diagnosis Date  . Allergic rhinitis   . Depression   . DJD (degenerative joint disease)   . Essential hypertension, benign   . PAF (paroxysmal atrial fibrillation) ([place])    Past Surgical History:  Procedure Laterality Date  . DILATION AND CURETTAGE OF UTERUS    . TUBAL LIGATION     Social History:  reports that she quit smoking about 28 years ago. Her smoking use included cigarettes. She started smoking about 56 years ago. She has a 13.50 pack-year smoking history. She has never used smokeless tobacco. She reports that she does not drink alcohol or use drugs.   Family History  Problem Relation Age of Onset  . Parkinson's disease Father   . Brain cancer Mother      Allergies  Allergen Reactions  . Codeine Phosphate Other (See Comments)    "knocks me out"     Prior to Admission medications   Medication Sig Start Date End Date Taking? Authorizing Provider  ALPRAZolam Duanne Moron) 0.5 MG tablet Take 0.5 mg by mouth as needed.    Yes [provider]  aspirin 81 MG tablet Take 81 mg by mouth daily.   Yes [provider]  benazepril (LOTENSIN) 40 MG tablet Take 1 tablet (40 mg total) by mouth daily. 02/14/14  Yes Satira Sark, MD  diclofenac sodium (VOLTAREN) 1 % GEL Apply 1 application topically 2 (two) times daily as needed. 12/11/16  Yes [provider]  fluticasone (FLONASE) 50 MCG/ACT nasal spray Place 2 sprays into both nostrils as needed.    Yes [provider]  ibuprofen (ADVIL,MOTRIN) 200 MG tablet Take 800 mg by mouth every  6 (six) hours as needed.   Yes [provider]  metoprolol succinate (TOPROL-XL) 25 MG 24 hr tablet TAKE ONE (1) TABLET EACH DAY 03/17/18  Yes Satira Sark, MD  Omega-3 Fatty Acids (FISH OIL) 1200 MG CAPS Take 1 capsule by mouth daily.   Yes [provider]  ondansetron (ZOFRAN) 4 MG tablet Take 1 tablet (4 mg total) by mouth every 8 (eight) hours as needed for  nausea or vomiting. 0/9/98  Yes Delora Fuel, MD  ondansetron (ZOFRAN) 4 MG tablet Take 1 tablet (4 mg total) by mouth every 6 (six) hours as needed. 12/12/80  Yes Delora Fuel, MD  oxyCODONE-acetaminophen (PERCOCET) 5-325 MG tablet Take 1 tablet by mouth every 4 (four) hours as needed for moderate pain. 5/0/53  Yes Delora Fuel, MD    Review of Systems:  Constitutional:  No weight loss, night sweats, Fevers, chills, fatigue.  Head&Eyes: No headache.  No vision loss.  No eye pain or scotoma ENT:  No Difficulty swallowing,Tooth/dental problems,Sore throat,  No ear ache, post nasal drip,  Cardio-vascular:  No chest pain, Orthopnea, PND, swelling in lower extremities,  dizziness, palpitations  GI:  No   diarrhea, loss of appetite, hematochezia, melena, heartburn, indigestion, Resp:  No shortness of breath with exertion or at rest. No cough. No coughing up of blood .No wheezing.No chest wall deformity  Skin:  no rash or lesions.  GU:  no dysuria, change in color of urine, no urgency or frequency. No flank pain.  Musculoskeletal:  No joint pain or swelling. No decreased range of motion. No back pain.  Psych:  No change in mood or affect. No depression or anxiety. Neurologic: No headache, no dysesthesia, no focal weakness, no vision loss. No syncope  Physical Exam: Vitals:   05/17/18 1008 05/17/18 1030 05/17/18 1100 05/17/18 1130  BP:  123/76 126/69 129/81  Pulse: (!) 102 (!) 106 95 (!) 104  Resp:      Temp:      TempSrc:      SpO2: 93% 92% 93% 92%   General:  A&O x 3, NAD, nontoxic, pleasant/cooperative Head/Eye: No conjunctival hemorrhage, no icterus, [place]/AT, No nystagmus ENT:  No icterus,  No thrush, good dentition, no pharyngeal exudate Neck:  No masses, no lymphadenpathy, no bruits CV:  IRRR, no rub, no gallop, no S3 Lung:  CTAB, good air movement, no wheeze, no rhonchi Abdomen: soft/epigastric and RUQ, +BS, nondistended, no peritoneal signs Ext: No cyanosis, No rashes, No  petechiae, No lymphangitis, No edema Neuro: CNII-XII intact, strength 4/5 in bilateral upper and lower extremities, no dysmetria  Labs on Admission:  Basic Metabolic Panel: Recent Labs  Lab 05/15/18 2311 05/17/18 0929  NA 137 136  K 3.8 3.4*  CL 101 103  CO2 28 27  GLUCOSE 125* 121*  BUN 14 12  CREATININE 0.77 0.67  CALCIUM 9.2 8.6*   Liver Function Tests: Recent Labs  Lab 05/15/18 2311 05/17/18 0929  AST 16 17  ALT 15 15  ALKPHOS 50 46  BILITOT 0.9 1.0  PROT 6.9 7.1  ALBUMIN 3.8 3.3*   Recent Labs  Lab 05/15/18 2311 05/17/18 0929  LIPASE 31 27   No results for input(s): AMMONIA in the last 168 hours. CBC: Recent Labs  Lab 05/15/18 2311 05/17/18 0929  WBC 11.5* 11.3*  NEUTROABS 9.2* 8.8*  HGB 12.7 11.7*  HCT 38.8 36.0  MCV 89.2 88.9  PLT 216 195   Coagulation Profile: No results for input(s): INR, PROTIME in  the last 168 hours. Cardiac Enzymes: No results for input(s): CKTOTAL, CKMB, CKMBINDEX, TROPONINI in the last 168 hours. BNP: Invalid input(s): POCBNP CBG: No results for input(s): GLUCAP in the last 168 hours. Urine analysis:    Component Value Date/Time   COLORURINE STRAW (A) 05/17/2018 0915   APPEARANCEUR CLEAR 05/17/2018 0915   LABSPEC 1.003 (L) 05/17/2018 0915   PHURINE 6.0 05/17/2018 0915   GLUCOSEU NEGATIVE 05/17/2018 0915   HGBUR SMALL (A) 05/17/2018 0915   BILIRUBINUR NEGATIVE 05/17/2018 0915   KETONESUR 5 (A) 05/17/2018 0915   PROTEINUR NEGATIVE 05/17/2018 0915   NITRITE NEGATIVE 05/17/2018 0915   LEUKOCYTESUR NEGATIVE 05/17/2018 0915   Sepsis Labs: @LABRCNTIP (procalcitonin:4,lacticidven:4) )No results found for this or any previous visit (from the past 240 hour(s)).   Radiological Exams on Admission: Dg Chest 2 View  Result Date: 05/17/2018 CLINICAL DATA:  RUQ pain beginning on 08/03 with N/V. Former smoker. Patient denies chest pain or SOB. EXAM: CHEST - 2 VIEW COMPARISON:  CT of the abdomen and pelvis on 05/16/2018.  FINDINGS: The heart is enlarged. There are no focal consolidations. There is RIGHT costophrenic angle blunting. No pulmonary edema. Visualized portion of the UPPER abdomen is unremarkable. IMPRESSION: Cardiomegaly.  No evidence for acute  abnormality. Electronically Signed   By: Nolon Nations M.D.   On: 05/17/2018 10:06   US Abdomen Limited  Result Date: 05/17/2018 CLINICAL DATA:  Upper abdominal pain with vomiting EXAM: ULTRASOUND ABDOMEN LIMITED RIGHT UPPER QUADRANT COMPARISON:  CT abdomen and pelvis May 16, 2018 FINDINGS: Gallbladder: Within the gallbladder, there are echogenic foci which move and shadow consistent with cholelithiasis. Largest gallstone measures 1.5 cm in length. There is evidence of thickening of the gallbladder wall with apparent gallbladder wall edema. There is no appreciable pericholecystic fluid. There is comet tail type artifact along the anterior gallbladder wall suggesting that there may be superimposed gallbladder wall inflammation such as adenomyomatosis or cholesterolosis. No sonographic Murphy sign noted by sonographer. Common bile duct: Diameter: 4 mm. No intrahepatic or extrahepatic biliary duct dilatation. Liver: No focal lesion identified. Within normal limits in parenchymal echogenicity. Portal vein is patent on color Doppler imaging with normal direction of blood flow towards the liver. IMPRESSION: Cholelithiasis with gallbladder wall thickening and apparent gallbladder wall edema. Suspect a degree of cute cholecystitis. There may well be superimposed adenomyomatosis or cholesterolosis in the gallbladder wall given comet tail type artifact anteriorly. Study otherwise unremarkable. Electronically Signed   By: Lowella Grip III M.D.   On: 05/17/2018 09:52   Ct Angio Abd/pel W And/or Wo Contrast  Result Date: 05/16/2018 CLINICAL DATA:  Upper abdominal pain with nausea and vomiting x1 day. Patient vomited 5 times. Pain radiates to the right lower back. EXAM: CTA  ABDOMEN AND PELVIS WITHOUT AND WITH CONTRAST TECHNIQUE: Multidetector CT imaging of the abdomen and pelvis was performed using the standard protocol during bolus administration of intravenous contrast. Multiplanar reconstructed images and MIPs were obtained and reviewed to evaluate the vascular anatomy. CONTRAST:  174mL ISOVUE-370 IOPAMIDOL (ISOVUE-370) INJECTION 76% COMPARISON:  None. FINDINGS: VASCULAR Aorta: Mild-to-moderate atherosclerosis of the abdominal aorta without aneurysm or dissection. No significant stenosis. Celiac: Patent without evidence of aneurysm, dissection, vasculitis or significant stenosis. SMA: Atherosclerosis at the origin of the SMA.  No thrombosis noted. Renals: Atherosclerosis of the origin of the main left renal artery without significant stenosis. A second smaller diminutive left renal artery is noted as well on the left. Single right renal artery is unremarkable. No evidence of fibromuscular  dysplasia. No dissection or aneurysm. IMA: Patent Inflow: Patent without evidence of aneurysm, dissection, vasculitis or significant stenosis. Mild-to-moderate atherosclerosis of the common iliac arteries bilaterally. Proximal Outflow: Patent without stenosis, aneurysm or dissection. The visualized superficial and profunda femoral arteries are patent. Veins: Negative Review of the MIP images confirms the above findings. NON-VASCULAR Lower chest: Cardiomegaly with bibasilar dependent atelectasis. No pericardial effusion. Hepatobiliary: Pericholecystic fluid and gallbladder wall thickening with a 13 mm calculus near the neck of the gallbladder. Findings raise concern for acute cholecystitis. Peripheral puddling of contrast consistent with a hemangioma is noted within the right hepatic lobe measuring 14 mm in maximum dimension. Fatty infiltration is noted adjacent to the falciform. Pancreas: Top-normal pancreatic duct caliber. No acute peripancreatic inflammation. Slightly in homogeneous area of hypo  density involving the junction of the pancreatic head and body, series 6/73 is identified, nonspecific possibly related to remote inflammatory change. This area measures 2 cm. Dedicated pancreatic CT or MRI may help for better assessment. Spleen: Normal size spleen. Adrenals/Urinary Tract: Adrenal glands are unremarkable. Kidneys are normal, without renal calculi, focal lesion, or hydronephrosis. Bladder is unremarkable. Stomach/Bowel: Small hiatal hernia. Decompressed stomach with normal small bowel rotation. No bowel obstruction or inflammation. The distal and terminal ileum are normal. Appendix is normal contrast filled. Scattered colonic diverticulosis without acute diverticulitis is visualized involving the sigmoid colon. Lymphatic: No lymphadenopathy. Reproductive: Uterus is anteverted in appearance and bilateral adnexa are unremarkable. Other: No abdominal wall hernia or abnormality. No abdominopelvic ascites. Musculoskeletal: Degenerative disc disease L1-2 and L2-3. Levoconvex curvature of the upper lumbar spine. No aggressive osseous lesions. IMPRESSION: VASCULAR Aortoiliac and branch vessel atherosclerosis. Patent branch vessels without embolism or significant stenosis. NON-VASCULAR 1. Gallbladder wall thickening with pericholecystic fluid and 13 mm calculus near the neck of the gallbladder. Findings raise concern for acute cholecystitis. 2. 14 mm peripheral puddling of contrast in the right hepatic lobe compatible with a hemangioma. 3. Subtle area of hypodensity involving pancreas at the junction head and body, nonspecific possibly related to stigmata of prior pancreatitis. Subtle lesion is not entirely excluded. This area of hypodensity measures approximately 2 cm. Dedicated nonemergent pancreatic CT or MRI protocol study is suggested. 4. Sigmoid diverticulosis without acute diverticulitis. 5. Levoscoliosis with lumbar spondylosis. Electronically Signed   By: Ashley Royalty M.D.   On: 05/16/2018 01:15     EKG: Independently reviewed. Afib, nonspecific T wave changes    Time spent:60 minutes Code Status:   FULL Family Communication:  No Family at bedside Disposition Plan: expect 2-3 day hospitalization Consults called: general surgery DVT Prophylaxis: Heparin     Orson Eva, DO  [facility] Pager [phone]  If 7PM-7AM, please contact night-coverage [email or web address] Password [email or web address] 05/17/2018, 12:05 PM

## 2018-05-18 ENCOUNTER — Inpatient Hospital Stay (HOSPITAL_COMMUNITY): Payer: Medicare Other | Admitting: Anesthesiology

## 2018-05-18 ENCOUNTER — Encounter (HOSPITAL_COMMUNITY)
Admission: EM | Disposition: A | Discharge status: Home / Self Care | Payer: Self-pay | Source: Home / Self Care | Attending: Internal Medicine

## 2018-05-18 ENCOUNTER — Encounter (HOSPITAL_COMMUNITY): Payer: Self-pay | Admitting: *Deleted

## 2018-05-18 DIAGNOSIS — K810 Acute cholecystitis: Secondary | ICD-10-CM

## 2018-05-18 HISTORY — PX: CHOLECYSTECTOMY: SHX55

## 2018-05-18 LAB — COMPREHENSIVE METABOLIC PANEL
ALT: 14 U/L (ref 0–44)
AST: 13 U/L — AB (ref 15–41)
Albumin: 3.1 g/dL — ABNORMAL LOW (ref 3.5–5.0)
Alkaline Phosphatase: 43 U/L (ref 38–126)
Anion gap: 6 (ref 5–15)
BUN: 13 mg/dL (ref 8–23)
CHLORIDE: 104 mmol/L (ref 98–111)
CO2: 29 mmol/L (ref 22–32)
Calcium: 8.6 mg/dL — ABNORMAL LOW (ref 8.9–10.3)
Creatinine, Ser: 0.71 mg/dL (ref 0.44–1.00)
GFR calc Af Amer: >60 mL/min (ref >60)
Glucose, Bld: 82 mg/dL (ref 70–99)
Potassium: 3.8 mmol/L (ref 3.5–5.1)
Sodium: 139 mmol/L (ref 135–145)
Total Bilirubin: 1 mg/dL (ref 0.3–1.2)
Total Protein: 6.7 g/dL (ref 6.5–8.1)

## 2018-05-18 LAB — CBC
HCT: 36.2 % (ref 36.0–46.0)
Hemoglobin: 11.8 g/dL — ABNORMAL LOW (ref 12.0–15.0)
MCH: 29.6 pg (ref 26.0–34.0)
MCHC: 32.6 g/dL (ref 30.0–36.0)
MCV: 90.7 fL (ref 78.0–100.0)
Platelets: 192 10*3/uL (ref 150–400)
RBC: 3.99 MIL/uL (ref 3.87–5.11)
RDW: 15 % (ref 11.5–15.5)
WBC: 8.3 10*3/uL (ref 4.0–10.5)

## 2018-05-18 SURGERY — LAPAROSCOPIC CHOLECYSTECTOMY
Anesthesia: General

## 2018-05-18 MED ORDER — PROMETHAZINE HCL 25 MG/ML IJ SOLN
6.2500 mg | INTRAMUSCULAR | Status: DC | PRN
  Administered 2018-05-18 – 2018-05-20 (×3): 6.25 mg via INTRAVENOUS
  Filled 2018-05-18: qty 1

## 2018-05-18 MED ORDER — ROCURONIUM BROMIDE 100 MG/10ML IV SOLN
INTRAVENOUS | Status: DC | PRN
  Administered 2018-05-18: 40 mg via INTRAVENOUS

## 2018-05-18 MED ORDER — HEMOSTATIC AGENTS (NO CHARGE) OPTIME
TOPICAL | Status: DC | PRN
  Administered 2018-05-18: 1 via TOPICAL

## 2018-05-18 MED ORDER — METOPROLOL TARTRATE 5 MG/5ML IV SOLN
INTRAVENOUS | Status: DC | PRN
  Administered 2018-05-18 (×5): 1 mg via INTRAVENOUS

## 2018-05-18 MED ORDER — SUGAMMADEX SODIUM 200 MG/2ML IV SOLN
INTRAVENOUS | Status: AC
  Filled 2018-05-18: qty 2

## 2018-05-18 MED ORDER — ROCURONIUM BROMIDE 50 MG/5ML IV SOLN
INTRAVENOUS | Status: AC
  Filled 2018-05-18: qty 1

## 2018-05-18 MED ORDER — SODIUM CHLORIDE 0.9 % IR SOLN
Status: DC | PRN
  Administered 2018-05-18: 1000 mL

## 2018-05-18 MED ORDER — PROMETHAZINE HCL 25 MG/ML IJ SOLN
INTRAMUSCULAR | Status: AC
  Filled 2018-05-18: qty 1

## 2018-05-18 MED ORDER — SUGAMMADEX SODIUM 200 MG/2ML IV SOLN
INTRAVENOUS | Status: DC | PRN
  Administered 2018-05-18: 152.4 mg via INTRAVENOUS

## 2018-05-18 MED ORDER — BUPIVACAINE HCL (PF) 0.5 % IJ SOLN
INTRAMUSCULAR | Status: AC
  Filled 2018-05-18: qty 30

## 2018-05-18 MED ORDER — ONDANSETRON HCL 4 MG/2ML IJ SOLN
INTRAMUSCULAR | Status: AC
  Filled 2018-05-18: qty 2

## 2018-05-18 MED ORDER — FENTANYL CITRATE (PF) 100 MCG/2ML IJ SOLN
INTRAMUSCULAR | Status: DC | PRN
  Administered 2018-05-18 (×4): 50 ug via INTRAVENOUS

## 2018-05-18 MED ORDER — DOCUSATE SODIUM 100 MG PO CAPS: 100.0000 mg | ORAL_CAPSULE | Freq: Two times a day (BID) | ORAL | Status: DC | PRN

## 2018-05-18 MED ORDER — PHENYLEPHRINE HCL 10 MG/ML IJ SOLN
INTRAMUSCULAR | Status: DC | PRN
  Administered 2018-05-18: 80 ug via INTRAVENOUS

## 2018-05-18 MED ORDER — BUPIVACAINE HCL (PF) 0.5 % IJ SOLN
INTRAMUSCULAR | Status: DC | PRN
  Administered 2018-05-18: 10 mL

## 2018-05-18 MED ORDER — LACTATED RINGERS IV SOLN
INTRAVENOUS | Status: DC
  Administered 2018-05-18: 1000 mL via INTRAVENOUS

## 2018-05-18 MED ORDER — LIDOCAINE HCL (CARDIAC) PF 100 MG/5ML IV SOSY
PREFILLED_SYRINGE | INTRAVENOUS | Status: DC | PRN
  Administered 2018-05-18: 30 mg via INTRAVENOUS

## 2018-05-18 MED ORDER — FENTANYL CITRATE (PF) 250 MCG/5ML IJ SOLN
INTRAMUSCULAR | Status: AC
  Filled 2018-05-18: qty 5

## 2018-05-18 MED ORDER — LIDOCAINE HCL (PF) 1 % IJ SOLN
INTRAMUSCULAR | Status: AC
  Filled 2018-05-18: qty 5

## 2018-05-18 MED ORDER — ONDANSETRON HCL 4 MG/2ML IJ SOLN
INTRAMUSCULAR | Status: DC | PRN
  Administered 2018-05-18: 4 mg via INTRAVENOUS

## 2018-05-18 MED ORDER — ARTIFICIAL TEARS OPHTHALMIC OINT
TOPICAL_OINTMENT | OPHTHALMIC | Status: AC
  Filled 2018-05-18: qty 7

## 2018-05-18 MED ORDER — OXYCODONE HCL 5 MG PO TABS
2.5000 mg | ORAL_TABLET | ORAL | Status: DC | PRN
  Administered 2018-05-18 – 2018-05-20 (×7): 5 mg via ORAL
  Filled 2018-05-18 (×7): qty 1

## 2018-05-18 MED ORDER — PROPOFOL 10 MG/ML IV BOLUS
INTRAVENOUS | Status: DC | PRN
  Administered 2018-05-18: 110 mg via INTRAVENOUS

## 2018-05-18 MED ORDER — FENTANYL CITRATE (PF) 100 MCG/2ML IJ SOLN: 25.0000 ug | INTRAMUSCULAR | Status: DC | PRN

## 2018-05-18 SURGICAL SUPPLY — 49 items
APPLICATOR ARISTA FLEXITIP XL (MISCELLANEOUS) ×1 IMPLANT
APPLIER CLIP ROT 10 11.4 M/L (STAPLE) ×2
BAG RETRIEVAL 10 (BASKET) ×1
BLADE SURG 15 STRL LF DISP TIS (BLADE) ×1 IMPLANT
BLADE SURG 15 STRL SS (BLADE) ×1
CHLORAPREP W/TINT 26ML (MISCELLANEOUS) ×2 IMPLANT
CLIP APPLIE ROT 10 11.4 M/L (STAPLE) ×1 IMPLANT
CLOTH BEACON ORANGE TIMEOUT ST (SAFETY) ×2 IMPLANT
COVER LIGHT HANDLE STERIS (MISCELLANEOUS) ×4 IMPLANT
DECANTER SPIKE VIAL GLASS SM (MISCELLANEOUS) ×2 IMPLANT
DERMABOND ADVANCED (GAUZE/BANDAGES/DRESSINGS) ×1
DERMABOND ADVANCED .7 DNX12 (GAUZE/BANDAGES/DRESSINGS) ×1 IMPLANT
ELECT REM PT RETURN 9FT ADLT (ELECTROSURGICAL) ×2
ELECTRODE REM PT RTRN 9FT ADLT (ELECTROSURGICAL) ×1 IMPLANT
FILTER SMOKE EVAC LAPAROSHD (FILTER) ×2 IMPLANT
GLOVE BIO SURGEON STRL SZ 6.5 (GLOVE) ×2 IMPLANT
GLOVE BIOGEL M 6.5 STRL (GLOVE) ×1 IMPLANT
GLOVE BIOGEL M 7.0 STRL (GLOVE) ×1 IMPLANT
GLOVE BIOGEL PI IND STRL 6.5 (GLOVE) ×1 IMPLANT
GLOVE BIOGEL PI IND STRL 7.0 (GLOVE) ×3 IMPLANT
GLOVE BIOGEL PI IND STRL 7.5 (GLOVE) IMPLANT
GLOVE BIOGEL PI INDICATOR 6.5 (GLOVE) ×2
GLOVE BIOGEL PI INDICATOR 7.0 (GLOVE) ×4
GLOVE BIOGEL PI INDICATOR 7.5 (GLOVE) ×1
GOWN STRL REUS W/TWL LRG LVL3 (GOWN DISPOSABLE) ×6 IMPLANT
HEMOSTAT ARISTA ABSORB 3G PWDR (MISCELLANEOUS) ×1 IMPLANT
HEMOSTAT SNOW SURGICEL 2X4 (HEMOSTASIS) ×2 IMPLANT
INST SET LAPROSCOPIC AP (KITS) ×2 IMPLANT
IV NS IRRIG 3000ML ARTHROMATIC (IV SOLUTION) ×1 IMPLANT
KIT TURNOVER KIT A (KITS) ×2 IMPLANT
MANIFOLD NEPTUNE II (INSTRUMENTS) ×2 IMPLANT
NDL INSUFFLATION 14GA 120MM (NEEDLE) ×1 IMPLANT
NEEDLE INSUFFLATION 14GA 120MM (NEEDLE) ×2 IMPLANT
NS IRRIG 1000ML POUR BTL (IV SOLUTION) ×2 IMPLANT
PACK LAP CHOLE LZT030E (CUSTOM PROCEDURE TRAY) ×2 IMPLANT
PAD ARMBOARD 7.5X6 YLW CONV (MISCELLANEOUS) ×2 IMPLANT
SET BASIN LINEN APH (SET/KITS/TRAYS/PACK) ×2 IMPLANT
SET TUBE IRRIG SUCTION NO TIP (IRRIGATION / IRRIGATOR) ×1 IMPLANT
SLEEVE ENDOPATH XCEL 5M (ENDOMECHANICALS) ×2 IMPLANT
SUT MNCRL AB 4-0 PS2 18 (SUTURE) ×2 IMPLANT
SUT VICRYL 0 UR6 27IN ABS (SUTURE) ×2 IMPLANT
SYS BAG RETRIEVAL 10MM (BASKET) ×1
SYSTEM BAG RETRIEVAL 10MM (BASKET) ×1 IMPLANT
TROCAR ENDO BLADELESS 11MM (ENDOMECHANICALS) ×2 IMPLANT
TROCAR XCEL NON-BLD 5MMX100MML (ENDOMECHANICALS) ×2 IMPLANT
TROCAR XCEL UNIV SLVE 11M 100M (ENDOMECHANICALS) ×2 IMPLANT
TUBE CONNECTING 12X1/4 (SUCTIONS) ×2 IMPLANT
TUBING INSUFFLATION (TUBING) ×2 IMPLANT
WARMER LAPAROSCOPE (MISCELLANEOUS) ×2 IMPLANT

## 2018-05-18 NOTE — Anesthesia Procedure Notes (Signed)
Procedure Name: Intubation Date/Time: 05/18/2018 12:24 PM Performed by: Andree Elk, Amy A, CRNA Pre-anesthesia Checklist: Patient identified, Patient being monitored, Timeout performed, Emergency Drugs available and Suction available Patient Re-evaluated:Patient Re-evaluated prior to induction Oxygen Delivery Method: Circle system utilized Preoxygenation: Pre-oxygenation with 100% oxygen Induction Type: IV induction Ventilation: Mask ventilation without difficulty Laryngoscope Size: Mac and 3 Grade View: Grade I Tube type: Oral Tube size: 7.0 mm Number of attempts: 1 Airway Equipment and Method: Stylet Placement Confirmation: ETT inserted through vocal cords under direct vision,  positive ETCO2 and breath sounds checked- equal and bilateral Secured at: 21 cm Tube secured with: Tape Dental Injury: Teeth and Oropharynx as per pre-operative assessment

## 2018-05-18 NOTE — Addendum Note (Signed)
Addendum  created 05/18/18 1407 by Mickel Baas, CRNA   Charge Capture section accepted

## 2018-05-18 NOTE — Transfer of Care (Signed)
Immediate Anesthesia Transfer of Care Note  Patient: Tara Floyd  Procedure(s) Performed: LAPAROSCOPIC CHOLECYSTECTOMY (N/A )  Patient Location: PACU  Anesthesia Type:General  Level of Consciousness: awake, alert , oriented and patient cooperative  Airway & Oxygen Therapy: Patient Spontanous Breathing and Patient connected to face mask oxygen  Post-op Assessment: Report given to RN and Post -op Vital signs reviewed and stable  Post vital signs: Reviewed and stable  Last Vitals:  Vitals Value Taken Time  BP    Temp    Pulse 111 05/18/2018  1:54 PM  Resp 19 05/18/2018  1:54 PM  SpO2 99 % 05/18/2018  1:54 PM  Vitals shown include unvalidated device data.  Last Pain:  Vitals:   05/18/18 1154  TempSrc: Oral  PainSc: 0-No pain      Patients Stated Pain Goal: 7 (38/38/18 4037)  Complications: No apparent anesthesia complications

## 2018-05-18 NOTE — Anesthesia Preprocedure Evaluation (Addendum)
Anesthesia Evaluation  Patient identified by MRN, date of birth, ID band Patient awake  General Assessment Comment:First anesthetic   Reviewed: Allergy & Precautions, NPO status , Patient's Chart, lab work & pertinent test results  Airway Mallampati: I  TM Distance: >3 FB Neck ROM: Full    Dental no notable dental hx. (+) Edentulous Upper, Edentulous Lower   Pulmonary neg pulmonary ROS, former smoker,    Pulmonary exam normal breath sounds clear to auscultation       Cardiovascular Exercise Tolerance: Good hypertension, Pt. on medications and Pt. on home beta blockers negative cardio ROS Normal cardiovascular exam+ dysrhythmias Atrial Fibrillation I Rhythm:Irregular Rate:Tachycardia  Afib Dx'd this year  Continues with great ET- never cardioverted  On B blocker    Neuro/Psych Depression negative neurological ROS  negative psych ROS   GI/Hepatic negative GI ROS, Neg liver ROS,   Endo/Other  negative endocrine ROS  Renal/GU negative Renal ROS  negative genitourinary   Musculoskeletal negative musculoskeletal ROS (+) Arthritis ,   Abdominal   Peds negative pediatric ROS (+)  Hematology negative hematology ROS (+)   Anesthesia Other Findings   Reproductive/Obstetrics negative OB ROS                            Anesthesia Physical Anesthesia Plan  ASA: III  Anesthesia Plan: General   Post-op Pain Management:    Induction: Intravenous  PONV Risk Score and Plan:   Airway Management Planned: Oral ETT  Additional Equipment:   Intra-op Plan:   Post-operative Plan: Extubation in OR  Informed Consent: I have reviewed the patients History and Physical, chart, labs and discussed the procedure including the risks, benefits and alternatives for the proposed anesthesia with the patient or authorized representative who has indicated his/her understanding and acceptance.   Dental advisory  given  Plan Discussed with: CRNA  Anesthesia Plan Comments:         Anesthesia Quick Evaluation

## 2018-05-18 NOTE — Interval H&P Note (Signed)
History and Physical Interval Note:  05/18/2018 11:43 AM  Tara Floyd  has presented today for surgery, with the diagnosis of cholecysitits  The various methods of treatment have been discussed with the patient and family. After consideration of risks, benefits and other options for treatment, the patient has consented to  Procedure(s): LAPAROSCOPIC CHOLECYSTECTOMY (N/A) as a surgical intervention .  The patient's history has been reviewed, patient examined, no change in status, stable for surgery.  I have reviewed the patient's chart and labs.  Questions were answered to the patient's satisfaction.    No additional questions. Proceeding with surgery today.   Virl Cagey

## 2018-05-18 NOTE — Op Note (Signed)
Operative Note   Preoperative Diagnosis: Acute cholecystitis   Postoperative Diagnosis:  Acute cholecystitis, hydrops    Procedure(s) Performed: Laparoscopic cholecystectomy   Surgeon: Lanell Matar. Constance Haw, MD   Assistants: No qualified resident was available   Anesthesia: General endotracheal   Anesthesiologist: Lenice Llamas, MD    Specimens: Gallbladder    Estimated Blood Loss: Minimal    Blood Replacement: None    Complications: None    Operative Findings:  Inflamed gallbladder with hydrops    Procedure: The patient was taken to the operating room and placed supine. General endotracheal anesthesia was induced. Intravenous antibiotics were administered per protocol. An orogastric tube positioned to decompress the stomach. The abdomen was prepared and draped in the usual sterile fashion.    A supraumbilical incision was made and a Veress technique was utilized to achieve pneumoperitoneum to 15 mmHg with carbon dioxide. A 11 mm optiview port was placed through the supraumbilical region, and a 10 mm 0-degree operative laparoscope was introduced. The area underlying the trocar and Veress needle were inspected and without evidence of injury.  Remaining trocars were placed under direct vision. Two 5 mm ports were placed in the right abdomen, between the anterior axillary and midclavicular line.  A final 11 mm port was placed through the mid-epigastrium, near the falciform ligament.   There were significant omental adhesions and a thicken rind over the gallbladder, this was stripped away with some oozing bleeding from the inflammation.  The gallbladder was decompressed and hydrops was noted in the fluid.  The gallbladder fundus was elevated cephalad and the infundibulum was retracted to the patient's right.  The lateral and medial edges of the gallbladder with dissected from the liver bed to aid in retration of the gallbladder away from the liver.  After much dissection of the rind from  the anterior and inferior edge of the gallbladder, the infundibulum was freed enough to start to identify the gallbladder/cystic duct junction which was skeletonized. The cystic artery noted in the triangle of Calot and was also skeletonized.  We then continued liberal medial and lateral dissection until the critical view of safety was achieved.  The common bile duct was noted inferior to these structures and was protected.    The cystic duct was triply clipped and cystic artery was doubly clipped and divided.  A posterior branch off the cystic artery was clipped once, this was very small. The gallbladder was then dissected from the liver bed with electrocautery. Given the inflammation there was significant oozing, and hemostasis was achieved with cautery. Arista was placed in the hepatic bed, and Surgical Emogene Morgan was placed.  The specimen was placed in an Endopouch and was retrieved through the epigastric site.   0 Vicryl fascial sutures were used to close the epigastric site a and umbilical port site was too small to need to close. Trocars were removed and pneumoperitoneum was released. Skin incisions were closed with 4-0 Monocryl subcuticular sutures and Dermabond. The patient was awakened from anesthesia and extubated without complication.    [name], MD [facility] [address] [place], [place] [address] [phone] (office)

## 2018-05-18 NOTE — Progress Notes (Signed)
[facility]  Patient Lap chole completed. Difficult with a lot of inflammation. Pending how patient is feeling can d/c home today versus tomorrow. Roxi PRN ordered for pain (has allergy to codeine "knocks me out") ordered 1/2 to 1 tablet for moderate pain. Colace BID PRN For constipation. Does not need antibiotics to go home. Heart healthy diet ordered.  [name], MD [facility] [address] [place], [place] [address] [phone] (office)

## 2018-05-18 NOTE — Progress Notes (Signed)
Case discussed with Dr. Constance Haw. Plan for lap chole either this afternoon, if schedule permits, or tomorrow am. Continue Zosyn for now. Will follow.  Domingo Mend, MD [facility] Pager: [phone]

## 2018-05-18 NOTE — Anesthesia Postprocedure Evaluation (Signed)
Anesthesia Post Note  Patient: Tara Floyd  Procedure(s) Performed: LAPAROSCOPIC CHOLECYSTECTOMY (N/A )  Patient location during evaluation: PACU Anesthesia Type: General Level of consciousness: awake and alert and oriented Pain management: pain level controlled Vital Signs Assessment: post-procedure vital signs reviewed and stable Respiratory status: spontaneous breathing Cardiovascular status: stable Postop Assessment: no apparent nausea or vomiting Anesthetic complications: no     Last Vitals:  Vitals:   05/18/18 1154 05/18/18 1354  BP: (!) 143/91 (P) 110/72  Pulse:    Resp: 18   Temp: 36.8 C (P) 37.7 C  SpO2: 93%     Last Pain:  Vitals:   05/18/18 1354  TempSrc:   PainSc: (P) 0-No pain                 ADAMS, AMY A

## 2018-05-19 ENCOUNTER — Ambulatory Visit: Payer: Self-pay | Admitting: General Surgery

## 2018-05-19 DIAGNOSIS — I481 Persistent atrial fibrillation: Secondary | ICD-10-CM

## 2018-05-19 MED ORDER — METOPROLOL SUCCINATE ER 50 MG PO TB24
50.0000 mg | ORAL_TABLET | Freq: Every day | ORAL | Status: DC
  Administered 2018-05-20 – 2018-05-21 (×2): 50 mg via ORAL
  Filled 2018-05-19 (×2): qty 1

## 2018-05-19 MED ORDER — DILTIAZEM LOAD VIA INFUSION
10.0000 mg | Freq: Once | INTRAVENOUS | Status: DC
  Filled 2018-05-19: qty 10

## 2018-05-19 MED ORDER — METOPROLOL SUCCINATE ER 50 MG PO TB24: 50.0000 mg | ORAL_TABLET | Freq: Every day | ORAL | 2 refills | Status: DC

## 2018-05-19 MED ORDER — OXYCODONE HCL 5 MG PO TABS: 2.5000 mg | ORAL_TABLET | ORAL | 0 refills | Status: DC | PRN

## 2018-05-19 MED ORDER — METOPROLOL SUCCINATE ER 50 MG PO TB24
50.0000 mg | ORAL_TABLET | Freq: Once | ORAL | Status: AC
  Administered 2018-05-19: 50 mg via ORAL
  Filled 2018-05-19: qty 1

## 2018-05-19 MED ORDER — DILTIAZEM HCL 25 MG/5ML IV SOLN
10.0000 mg | Freq: Once | INTRAVENOUS | Status: AC
  Administered 2018-05-19: 10 mg via INTRAVENOUS
  Filled 2018-05-19: qty 5

## 2018-05-19 MED ORDER — ONDANSETRON HCL 4 MG PO TABS: 4.0000 mg | ORAL_TABLET | Freq: Four times a day (QID) | ORAL | 0 refills | Status: DC | PRN

## 2018-05-19 NOTE — Anesthesia Postprocedure Evaluation (Signed)
Anesthesia Post Note  Patient: Tara Floyd  Procedure(s) Performed: LAPAROSCOPIC CHOLECYSTECTOMY (N/A )  Patient location during evaluation: Nursing Unit Anesthesia Type: General Level of consciousness: awake and alert and oriented Pain management: pain level controlled Vital Signs Assessment: post-procedure vital signs reviewed and stable Respiratory status: spontaneous breathing and patient connected to nasal cannula oxygen Cardiovascular status: stable (Remains in Afib with sometimes elevated rate; asymptomatic) : some nausea yesterday and this morning; relieved with phenergan. Anesthetic complications: no     Last Vitals:  Vitals:   05/19/18 0048 05/19/18 0500  BP: 136/77 113/71  Pulse: (!) 122 98  Resp: 16 16  Temp: 36.8 C 36.7 C  SpO2: 94% 90%    Last Pain:  Vitals:   05/19/18 0819  TempSrc:   PainSc: 6                  Darrah Dredge A

## 2018-05-19 NOTE — Addendum Note (Signed)
Addendum  created 05/19/18 0929 by Mickel Baas, CRNA   Sign clinical note

## 2018-05-19 NOTE — Discharge Summary (Signed)
Physician Discharge Summary  Tara Floyd KZS:[id number] DOB: Mar 27, 1946 DOA: 05/17/2018  PCP: Octavio Graves, DO  Admit date: 05/17/2018 Discharge date: 05/19/2018  Time spent: 45 minutes  Recommendations for Outpatient Follow-up:  -Will be discharged home today. -Follow up with Dr. Constance Haw in 2 weeks.   Discharge Diagnoses:  Active Problems:   Essential hypertension, benign   Acute cholecystitis   Persistent atrial fibrillation [facility])   Discharge Condition: Stable and improved  Filed Weights   05/17/18 1430  Weight: 76.2 kg    History of present illness:  As per Dr. Carles Collet on 8/6: Tara Floyd is a 72 y.o. female with medical history of persistent atrial fibrillation and hypertension presenting with nausea, vomiting, and abdominal pain that began on 05/14/2018.  The patient visited the emergency department on 05/15/2018.  She was treated symptomatically.  CT Angio of the abdomen and pelvis was performed at that time and showed gallbladder wall thickening with pericholecystic fluid and a calculus near the neck of the gallbladder.  There was concerns for acute cholecystitis.  She was offered admission, but the patient did not want to stay in the hospital.  She was discharged in stable condition.  Unfortunately, the patient began having nausea and vomiting with worsening abdominal pain once again once she woke up on the morning of 05/16/2018.  The patient could not wait until her appointment with general surgery.  As result, she represented to the hospital for further evaluation.  She denies any fevers, chills, chest pain, shortness breath, diarrhea, hematochezia, melena. In the emergency department, the patient was afebrile and hemodynamically stable saturating 95% on room air.  Heart rate was 100-110.  The patient was given Zosyn, IV fluids, and antiemetics and morphine.  Hospital Course:   Acute Cholecystitis -S/p lap chole on 8/7. -Tolerating small amounts of food without pain or n/v. -Ok  for DC home home. -Will provide #20 oxy IR 5 mg and zofran for pain and nausea management at home following surgery. -Seen by surgery who agrees with DC home.  Persistent A Fib -Had some RVR overnight up into the 150s likely related to pain. -Metoprolol has been increased from 25-50 mg. -Hrs in the 90s to 100s. -Patient qualifies for anticoagulation but has refused in the past.  Anxiety -Continue alprazolam at home dose (no prescriptions given).  Procedures:  Lap chole   Consultations:  General Surgery, Dr. Constance Haw  Discharge Instructions  Discharge Instructions    Diet - low sodium heart healthy   Complete by:  As directed    Increase activity slowly   Complete by:  As directed      Allergies as of 05/19/2018      Reactions   Codeine Phosphate Other (See Comments)   "knocks me out"      Medication List    STOP taking these medications   oxyCODONE-acetaminophen 5-325 MG tablet Commonly known as:  PERCOCET/ROXICET     TAKE these medications   ALPRAZolam 0.5 MG tablet Commonly known as:  XANAX Take 0.5 mg by mouth as needed.   aspirin 81 MG tablet Take 81 mg by mouth daily.   benazepril 40 MG tablet Commonly known as:  LOTENSIN Take 1 tablet (40 mg total) by mouth daily.   diclofenac sodium 1 % Gel Commonly known as:  VOLTAREN Apply 1 application topically 2 (two) times daily as needed.   Fish Oil 1200 MG Caps Take 1 capsule by mouth daily.   fluticasone 50 MCG/ACT nasal spray  Commonly known as:  FLONASE Place 2 sprays into both nostrils as needed.   ibuprofen 200 MG tablet Commonly known as:  ADVIL,MOTRIN Take 800 mg by mouth every 6 (six) hours as needed.   metoprolol succinate 50 MG 24 hr tablet Commonly known as:  TOPROL-XL Take 1 tablet (50 mg total) by mouth daily. Take with or immediately following a meal. Start taking on:  05/20/2018 What changed:    medication strength  See the new instructions.   ondansetron 4 MG tablet Commonly  known as:  ZOFRAN Take 1 tablet (4 mg total) by mouth every 6 (six) hours as needed for nausea. What changed:    when to take this  reasons to take this  Another medication with the same name was removed. Continue taking this medication, and follow the directions you see here.   oxyCODONE 5 MG immediate release tablet Commonly known as:  Oxy IR/ROXICODONE Take 0.5-1 tablets (2.5-5 mg total) by mouth every 4 (four) hours as needed for moderate pain.      Allergies  Allergen Reactions  . Codeine Phosphate Other (See Comments)    "knocks me out"   Follow-up Information    Virl Cagey, MD In 2 weeks.   Specialty:  General Surgery Contact information: [address] [place] [facility] [address] [phone]            The results of significant diagnostics from this hospitalization (including imaging, microbiology, ancillary and laboratory) are listed below for reference.    Significant Diagnostic Studies: Dg Chest 2 View  Result Date: 05/17/2018 CLINICAL DATA:  RUQ pain beginning on 08/03 with N/V. Former smoker. Patient denies chest pain or SOB. EXAM: CHEST - 2 VIEW COMPARISON:  CT of the abdomen and pelvis on 05/16/2018. FINDINGS: The heart is enlarged. There are no focal consolidations. There is RIGHT costophrenic angle blunting. No pulmonary edema. Visualized portion of the UPPER abdomen is unremarkable. IMPRESSION: Cardiomegaly.  No evidence for acute  abnormality. Electronically Signed   By: Nolon Nations M.D.   On: 05/17/2018 10:06   US Abdomen Limited  Result Date: 05/17/2018 CLINICAL DATA:  Upper abdominal pain with vomiting EXAM: ULTRASOUND ABDOMEN LIMITED RIGHT UPPER QUADRANT COMPARISON:  CT abdomen and pelvis May 16, 2018 FINDINGS: Gallbladder: Within the gallbladder, there are echogenic foci which move and shadow consistent with cholelithiasis. Largest gallstone measures 1.5 cm in length. There is evidence of thickening of the gallbladder wall with  apparent gallbladder wall edema. There is no appreciable pericholecystic fluid. There is comet tail type artifact along the anterior gallbladder wall suggesting that there may be superimposed gallbladder wall inflammation such as adenomyomatosis or cholesterolosis. No sonographic Murphy sign noted by sonographer. Common bile duct: Diameter: 4 mm. No intrahepatic or extrahepatic biliary duct dilatation. Liver: No focal lesion identified. Within normal limits in parenchymal echogenicity. Portal vein is patent on color Doppler imaging with normal direction of blood flow towards the liver. IMPRESSION: Cholelithiasis with gallbladder wall thickening and apparent gallbladder wall edema. Suspect a degree of cute cholecystitis. There may well be superimposed adenomyomatosis or cholesterolosis in the gallbladder wall given comet tail type artifact anteriorly. Study otherwise unremarkable. Electronically Signed   By: Lowella Grip III M.D.   On: 05/17/2018 09:52   Ct Angio Abd/pel W And/or Wo Contrast  Result Date: 05/16/2018 CLINICAL DATA:  Upper abdominal pain with nausea and vomiting x1 day. Patient vomited 5 times. Pain radiates to the right lower back. EXAM: CTA ABDOMEN AND PELVIS WITHOUT AND WITH  CONTRAST TECHNIQUE: Multidetector CT imaging of the abdomen and pelvis was performed using the standard protocol during bolus administration of intravenous contrast. Multiplanar reconstructed images and MIPs were obtained and reviewed to evaluate the vascular anatomy. CONTRAST:  159mL ISOVUE-370 IOPAMIDOL (ISOVUE-370) INJECTION 76% COMPARISON:  None. FINDINGS: VASCULAR Aorta: Mild-to-moderate atherosclerosis of the abdominal aorta without aneurysm or dissection. No significant stenosis. Celiac: Patent without evidence of aneurysm, dissection, vasculitis or significant stenosis. SMA: Atherosclerosis at the origin of the SMA.  No thrombosis noted. Renals: Atherosclerosis of the origin of the main left renal artery without  significant stenosis. A second smaller diminutive left renal artery is noted as well on the left. Single right renal artery is unremarkable. No evidence of fibromuscular dysplasia. No dissection or aneurysm. IMA: Patent Inflow: Patent without evidence of aneurysm, dissection, vasculitis or significant stenosis. Mild-to-moderate atherosclerosis of the common iliac arteries bilaterally. Proximal Outflow: Patent without stenosis, aneurysm or dissection. The visualized superficial and profunda femoral arteries are patent. Veins: Negative Review of the MIP images confirms the above findings. NON-VASCULAR Lower chest: Cardiomegaly with bibasilar dependent atelectasis. No pericardial effusion. Hepatobiliary: Pericholecystic fluid and gallbladder wall thickening with a 13 mm calculus near the neck of the gallbladder. Findings raise concern for acute cholecystitis. Peripheral puddling of contrast consistent with a hemangioma is noted within the right hepatic lobe measuring 14 mm in maximum dimension. Fatty infiltration is noted adjacent to the falciform. Pancreas: Top-normal pancreatic duct caliber. No acute peripancreatic inflammation. Slightly in homogeneous area of hypo density involving the junction of the pancreatic head and body, series 6/73 is identified, nonspecific possibly related to remote inflammatory change. This area measures 2 cm. Dedicated pancreatic CT or MRI may help for better assessment. Spleen: Normal size spleen. Adrenals/Urinary Tract: Adrenal glands are unremarkable. Kidneys are normal, without renal calculi, focal lesion, or hydronephrosis. Bladder is unremarkable. Stomach/Bowel: Small hiatal hernia. Decompressed stomach with normal small bowel rotation. No bowel obstruction or inflammation. The distal and terminal ileum are normal. Appendix is normal contrast filled. Scattered colonic diverticulosis without acute diverticulitis is visualized involving the sigmoid colon. Lymphatic: No  lymphadenopathy. Reproductive: Uterus is anteverted in appearance and bilateral adnexa are unremarkable. Other: No abdominal wall hernia or abnormality. No abdominopelvic ascites. Musculoskeletal: Degenerative disc disease L1-2 and L2-3. Levoconvex curvature of the upper lumbar spine. No aggressive osseous lesions. IMPRESSION: VASCULAR Aortoiliac and branch vessel atherosclerosis. Patent branch vessels without embolism or significant stenosis. NON-VASCULAR 1. Gallbladder wall thickening with pericholecystic fluid and 13 mm calculus near the neck of the gallbladder. Findings raise concern for acute cholecystitis. 2. 14 mm peripheral puddling of contrast in the right hepatic lobe compatible with a hemangioma. 3. Subtle area of hypodensity involving pancreas at the junction head and body, nonspecific possibly related to stigmata of prior pancreatitis. Subtle lesion is not entirely excluded. This area of hypodensity measures approximately 2 cm. Dedicated nonemergent pancreatic CT or MRI protocol study is suggested. 4. Sigmoid diverticulosis without acute diverticulitis. 5. Levoscoliosis with lumbar spondylosis. Electronically Signed   By: Ashley Royalty M.D.   On: 05/16/2018 01:15    Microbiology: Recent Results (from the past 240 hour(s))  Surgical pcr screen     Status: None   Collection Time: 05/17/18  4:45 PM  Result Value Ref Range Status   MRSA, PCR NEGATIVE NEGATIVE Final   Staphylococcus aureus NEGATIVE NEGATIVE Final    Comment: (NOTE) The Xpert SA Assay (FDA approved for NASAL specimens in patients 73 years of age and older), is one component of  a comprehensive surveillance program. It is not intended to diagnose infection nor to guide or monitor treatment. Performed at [facility], [address] [place], [place] [address]      Labs: Basic Metabolic Panel: Recent Labs  Lab 05/15/18 2311 05/17/18 0929 05/18/18 0455  NA 137 136 139  K 3.8 3.4* 3.8  CL 101 103 104  CO2 28 27 29     GLUCOSE 125* 121* 82  BUN 14 12 13   CREATININE 0.77 0.67 0.71  CALCIUM 9.2 8.6* 8.6*   Liver Function Tests: Recent Labs  Lab 05/15/18 2311 05/17/18 0929 05/18/18 0455  AST 16 17 13*  ALT 15 15 14   ALKPHOS 50 46 43  BILITOT 0.9 1.0 1.0  PROT 6.9 7.1 6.7  ALBUMIN 3.8 3.3* 3.1*   Recent Labs  Lab 05/15/18 2311 05/17/18 0929  LIPASE 31 27   No results for input(s): AMMONIA in the last 168 hours. CBC: Recent Labs  Lab 05/15/18 2311 05/17/18 0929 05/18/18 0455  WBC 11.5* 11.3* 8.3  NEUTROABS 9.2* 8.8*  --   HGB 12.7 11.7* 11.8*  HCT 38.8 36.0 36.2  MCV 89.2 88.9 90.7  PLT 216 195 192   Cardiac Enzymes: No results for input(s): CKTOTAL, CKMB, CKMBINDEX, TROPONINI in the last 168 hours. BNP: BNP (last 3 results) No results for input(s): BNP in the last 8760 hours.  ProBNP (last 3 results) No results for input(s): PROBNP in the last 8760 hours.  CBG: No results for input(s): GLUCAP in the last 168 hours.     Signed:  Lelon Frohlich  [facility] Pager: [phone] 05/19/2018, 3:32 PM

## 2018-05-19 NOTE — Progress Notes (Signed)
[facility]  Doing well. Looks good this am and this afternoon. Pain improved. Did not eat much. Has not ambulated.   Ambulate in halls. Trial diet.  Once tolerating food and pain controlled with po meds can d/c home. Dr. Jerilee Hoh increased metoprolol for A fib.   Follow up in 2 weeks.   [name], MD [facility] [address] [place], [place] [address] [phone] (office)

## 2018-05-19 NOTE — Progress Notes (Signed)
Patient's HR has been sustaining 110s-120s per tele. Patient's HR slowly increasing, now sustaining 120s-130s. HR elevated up to 160s with patient just repositioning in the bed. Patient still asymptomatic. Opyd, MD text paged for notification. Will continue to monitor.

## 2018-05-19 NOTE — Progress Notes (Signed)
Called by RN, with ambulation HR increased to 200. Patient asymptomatic. Metoprolol dose had been increased from 25 to 50 mg but dose not scheduled until tomorrow am. Advised RN to give dose now. With rest HR down to 120s. Will cancel Dc for today and observe overnight on telemetry.  Domingo Mend, MD [facility] Pager: [phone]

## 2018-05-19 NOTE — Progress Notes (Signed)
Tele called regarding patient's HR in the 160s sustained while patient was standing at the sink washing hands after going to the bathroom. Patient denied symptoms. A few minutes later, tele called stating that HR up to 190s-200s while patient was still standing at the sink doing a quick bath. Patient still denied symptoms. NT and RN in room with patient this whole time. Patient assisted to bed. HR now 120s-130s per tele while patient in bed. Will continue to monitor.

## 2018-05-20 ENCOUNTER — Inpatient Hospital Stay (HOSPITAL_COMMUNITY): Payer: Medicare Other

## 2018-05-20 ENCOUNTER — Encounter (HOSPITAL_COMMUNITY): Payer: Self-pay | Admitting: General Surgery

## 2018-05-20 DIAGNOSIS — K567 Ileus, unspecified: Secondary | ICD-10-CM

## 2018-05-20 DIAGNOSIS — J9601 Acute respiratory failure with hypoxia: Secondary | ICD-10-CM

## 2018-05-20 DIAGNOSIS — K9189 Other postprocedural complications and disorders of digestive system: Secondary | ICD-10-CM

## 2018-05-20 LAB — COMPREHENSIVE METABOLIC PANEL
ALT: 22 U/L (ref 0–44)
AST: 17 U/L (ref 15–41)
Albumin: 2.7 g/dL — ABNORMAL LOW (ref 3.5–5.0)
Alkaline Phosphatase: 35 U/L — ABNORMAL LOW (ref 38–126)
Anion gap: 7 (ref 5–15)
BILIRUBIN TOTAL: 1.1 mg/dL (ref 0.3–1.2)
BUN: 15 mg/dL (ref 8–23)
CO2: 31 mmol/L (ref 22–32)
CREATININE: 0.67 mg/dL (ref 0.44–1.00)
Calcium: 8.9 mg/dL (ref 8.9–10.3)
Chloride: 98 mmol/L (ref 98–111)
GFR calc non Af Amer: >60 mL/min (ref >60)
Glucose, Bld: 116 mg/dL — ABNORMAL HIGH (ref 70–99)
Potassium: 3.9 mmol/L (ref 3.5–5.1)
Sodium: 136 mmol/L (ref 135–145)
TOTAL PROTEIN: 6.3 g/dL — AB (ref 6.5–8.1)

## 2018-05-20 LAB — CBC WITH DIFFERENTIAL/PLATELET
BASOS ABS: 0 10*3/uL (ref 0.0–0.1)
Basophils Relative: 0 %
Eosinophils Absolute: 0 10*3/uL (ref 0.0–0.7)
Eosinophils Relative: 0 %
HEMATOCRIT: 38.2 % (ref 36.0–46.0)
Hemoglobin: 12.4 g/dL (ref 12.0–15.0)
LYMPHS ABS: 0.6 10*3/uL — AB (ref 0.7–4.0)
LYMPHS PCT: 4 %
MCH: 29.2 pg (ref 26.0–34.0)
MCHC: 32.5 g/dL (ref 30.0–36.0)
MCV: 89.9 fL (ref 78.0–100.0)
MONO ABS: 1.5 10*3/uL — AB (ref 0.1–1.0)
Monocytes Relative: 11 %
NEUTROS ABS: 11.9 10*3/uL — AB (ref 1.7–7.7)
Neutrophils Relative %: 85 %
Platelets: 238 10*3/uL (ref 150–400)
RBC: 4.25 MIL/uL (ref 3.87–5.11)
RDW: 14.7 % (ref 11.5–15.5)
WBC: 14.1 10*3/uL — ABNORMAL HIGH (ref 4.0–10.5)

## 2018-05-20 MED ORDER — BISACODYL 10 MG RE SUPP: 10.0000 mg | Freq: Once | RECTAL | Status: DC

## 2018-05-20 MED ORDER — DOCUSATE SODIUM 100 MG PO CAPS
100.0000 mg | ORAL_CAPSULE | Freq: Two times a day (BID) | ORAL | Status: DC
  Administered 2018-05-20 – 2018-05-21 (×2): 100 mg via ORAL
  Filled 2018-05-20 (×3): qty 1

## 2018-05-20 MED ORDER — FAMOTIDINE IN NACL 20-0.9 MG/50ML-% IV SOLN
20.0000 mg | Freq: Two times a day (BID) | INTRAVENOUS | Status: DC
  Administered 2018-05-20 – 2018-05-21 (×3): 20 mg via INTRAVENOUS
  Filled 2018-05-20 (×3): qty 50

## 2018-05-20 NOTE — Progress Notes (Signed)
[facility] Progress Note  2 Days Post-Op  Subjective: Nausea and vomiting. KUB obtained and shows ileus. Had some desaturations overnight and no IS in room. RT came to see and had patient cough.  Patient with some pain from retching.  Labs sent this AM after nausea/vomiting and pain. LFTs looking wnl and WBC as expected after cholecystitis.    Objective: Vital signs in last 24 hours: Temp:  [97.6 F (36.4 C)-98.2 F (36.8 C)] 97.6 F (36.4 C) (08/09 0418) Pulse Rate:  [88-124] 124 (08/09 0418) Resp:  [18] 18 (08/09 0418) BP: (110-133)/(70-91) 133/91 (08/09 0418) SpO2:  [78 %-98 %] 92 % (08/09 1434) Last BM Date: 05/16/18  Intake/Output from previous day: 08/08 0701 - 08/09 0700 In: 2554.8 [P.O.:840; I.V.:1553.8; IV Piggyback:161] Out: -  Intake/Output this shift: Total I/O In: 120 [P.O.:120] Out: -   General appearance: alert, cooperative and no distress Resp: normal work breathing GI: soft, tender around port sites, no rebound or guarding, port sites with dermabond, no erythema or drainage  Lab Results:  Recent Labs    05/18/18 0455 05/20/18 1149  WBC 8.3 14.1*  HGB 11.8* 12.4  HCT 36.2 38.2  PLT 192 238  Dif 85, Neutrophil # 11.9   BMET Recent Labs    05/18/18 0455 05/20/18 1149  NA 139 136  K 3.8 3.9  CL 104 98  CO2 29 31  GLUCOSE 82 116*  BUN 13 15  CREATININE 0.71 0.67  CALCIUM 8.6* 8.9    Results for Tara Floyd, Tara Floyd (MRN [id number]) as of 05/20/2018 15:53  Ref. Range 05/20/2018 11:49  Anion gap Latest Ref Range: 5 - 15  7  Alkaline Phosphatase Latest Ref Range: 38 - 126 U/L 35 (L)  Albumin Latest Ref Range: 3.5 - 5.0 g/dL 2.7 (L)  AST Latest Ref Range: 15 - 41 U/L 17  ALT Latest Ref Range: 0 - 44 U/L 22  Total Protein Latest Ref Range: 6.5 - 8.1 g/dL 6.3 (L)  Total Bilirubin Latest Ref Range: 0.3 - 1.2 mg/dL 1.1  GFR, Est Non African American Latest Ref Range: >60 mL/min >60  GFR, Est African American Latest Ref Range: >60 mL/min  >60    PT/INR No results for input(s): LABPROT, INR in the last 72 hours.  Personally reviewed KUB- dilated loops with stool balls in vault, consistent with ileus  Studies/Results: Dg Chest 2 View  Result Date: 05/20/2018 CLINICAL DATA:  72 year old female with a history of hypoxia EXAM: CHEST - 2 VIEW COMPARISON:  05/17/2018 FINDINGS: Cardiomediastinal silhouette unchanged in size and contour, partially obscured by overlying lung/pleural disease. Opacity at the bilateral lung bases with obscuration of the hemidiaphragms and heart borders. Meniscus on the lateral view. No pneumothorax. IMPRESSION: New bilateral pleural effusions, larger on the right. Associated atelectasis/consolidation Electronically Signed   By: Corrie Mckusick D.O.   On: 05/20/2018 12:39   Dg Abd 1 View  Result Date: 05/20/2018 CLINICAL DATA:  72 year old female status post cholecystectomy. Abdominal an pain and nausea EXAM: ABDOMEN - 1 VIEW COMPARISON:  CT 05/16/2018 FINDINGS: Partially distended small bowel loops and colon. Surgical changes of cholecystectomy. No unexpected radiopaque foreign body. Degenerative changes of the spine. IMPRESSION: Mild dilation of small bowel loops, most likely ileus given recent cholecystectomy Electronically Signed   By: Corrie Mckusick D.O.   On: 05/20/2018 12:20    Anti-infectives: Anti-infectives (From admission, onward)   Start     Dose/Rate Route Frequency Ordered Stop   05/18/18 0600  cefoTEtan (  CEFOTAN) 2 g in sodium chloride 0.9 % 100 mL IVPB     2 g 200 mL/hr over 30 Minutes Intravenous On call to O.R. 05/17/18 1634 05/18/18 1900   05/17/18 1700  piperacillin-tazobactam (ZOSYN) IVPB 3.375 g     3.375 g 12.5 mL/hr over 240 Minutes Intravenous Every 8 hours 05/17/18 1430     05/17/18 1115  piperacillin-tazobactam (ZOSYN) IVPB 3.375 g     3.375 g 100 mL/hr over 30 Minutes Intravenous  Once 05/17/18 1114 05/17/18 1138      Assessment/Plan: Tara Floyd is a 72 yo POD 2 s/p Lap chole  for cholecystitis who has a post operative ileus and has been having issues with her A fib.  Doing fair but having retching and abdominal pain. Labs and Xray reassuring that this is ileus. LFTs wnl.  PRN for pain IS, OOB, made sure she got an IS and knows how to use it  NPO for now, had BM, needs to ambulate and pass gas/ if does not improve may need NG tube  Urinating /voiding normally  Rn aware of possibility of NG and will notify me how patient doing later in shift    LOS: 3 days    Tara Floyd 05/20/2018

## 2018-05-20 NOTE — Progress Notes (Addendum)
PROGRESS NOTE    Tara Floyd  VFI:[id number] DOB: 03-09-1946 DOA: 05/17/2018 PCP: Octavio Graves, DO     Brief Narrative:  72 year old woman admitted from home on 8/6 with abdominal pain.  Found to have acute cholecystitis and underwent laparoscopic cholecystectomy on 8/7.  Postoperatively has had increased abdominal pain with emesis as well as issues with controlling her heart rate in regards to her atrial fibrillation.   Assessment & Plan:   Principal Problem:   Acute cholecystitis Active Problems:   Essential hypertension, benign   Persistent atrial fibrillation (HCC)   Postoperative ileus (HCC)   Acute hypoxemic respiratory failure (HCC)   Acute cholecystitis -Postoperative day 2, now with ileus.  Electrolytes pending, surgery following.  She has been made n.p.o.  May need NG tube if emesis continues.  Persistent atrial fibrillation -Not chronically anticoagulated per patient preference. -Heart rates have been uncontrolled, metoprolol was increased from 25-50. -Surgery is allowing sips with meds, so we will continue oral metoprolol for now.  If we have concerns with continued emesis will transition to IV.  Acute hypoxemic respiratory failure -Chest x-ray ordered today shows bibasilar atelectasis and mild pleural effusions. -Advised use of incentive spirometry, oxygen as needed.  No indication of pneumonia.   DVT prophylaxis: Subcutaneous heparin Code Status: Full code Family Communication: Husband at bedside updated on plan of care and all questions answered Disposition Plan: Pending clinical improvement  Consultants:   General surgery  Procedures:   None  Antimicrobials:  Anti-infectives (From admission, onward)   Start     Dose/Rate Route Frequency Ordered Stop   05/18/18 0600  cefoTEtan (CEFOTAN) 2 g in sodium chloride 0.9 % 100 mL IVPB     2 g 200 mL/hr over 30 Minutes Intravenous On call to O.R. 05/17/18 1634 05/18/18 1900   05/17/18 1700   piperacillin-tazobactam (ZOSYN) IVPB 3.375 g     3.375 g 12.5 mL/hr over 240 Minutes Intravenous Every 8 hours 05/17/18 1430     05/17/18 1115  piperacillin-tazobactam (ZOSYN) IVPB 3.375 g     3.375 g 100 mL/hr over 30 Minutes Intravenous  Once 05/17/18 1114 05/17/18 1138       Subjective: In bed, significant nausea, denies shortness of breath  Objective: Vitals:   05/20/18 0418 05/20/18 0610 05/20/18 0615 05/20/18 1434  BP: (!) 133/91     Pulse: (!) 124     Resp: 18     Temp: 97.6 F (36.4 C)     TempSrc: Oral     SpO2: 96% (!) 87% 98% 92%  Weight:      Height:        Intake/Output Summary (Last 24 hours) at 05/20/2018 1633 Last data filed at 05/20/2018 1200 Gross per 24 hour  Intake 1480 ml  Output -  Net 1480 ml   Filed Weights   05/17/18 1430  Weight: 76.2 kg    Examination:  General exam: Alert, awake, oriented x 3 Respiratory system: Decreased bibasilar breath sounds respiratory effort normal. Cardiovascular system:RRR. No murmurs, rubs, gallops. Gastrointestinal system: Abdomen is nondistended, soft and nontender. No organomegaly or masses felt. Normal bowel sounds heard. Central nervous system: Alert and oriented. No focal neurological deficits. Extremities: No C/C/E, +pedal pulses Skin: No rashes, lesions or ulcers Psychiatry: Judgement and insight appear normal. Mood & affect appropriate.     Data Reviewed: I have personally reviewed following labs and imaging studies  CBC: Recent Labs  Lab 05/15/18 2311 05/17/18 0929 05/18/18 0455 05/20/18 1149  WBC 11.5*  11.3* 8.3 14.1*  NEUTROABS 9.2* 8.8*  --  11.9*  HGB 12.7 11.7* 11.8* 12.4  HCT 38.8 36.0 36.2 38.2  MCV 89.2 88.9 90.7 89.9  PLT 216 195 192 098   Basic Metabolic Panel: Recent Labs  Lab 05/15/18 2311 05/17/18 0929 05/18/18 0455 05/20/18 1149  NA 137 136 139 136  K 3.8 3.4* 3.8 3.9  CL 101 103 104 98  CO2 28 27 29 31   GLUCOSE 125* 121* 82 116*  BUN 14 12 13 15   CREATININE 0.77  0.67 0.71 0.67  CALCIUM 9.2 8.6* 8.6* 8.9   GFR: Estimated Creatinine Clearance: 62.1 mL/min (by C-G formula based on SCr of 0.67 mg/dL). Liver Function Tests: Recent Labs  Lab 05/15/18 2311 05/17/18 0929 05/18/18 0455 05/20/18 1149  AST 16 17 13* 17  ALT 15 15 14 22   ALKPHOS 50 46 43 35*  BILITOT 0.9 1.0 1.0 1.1  PROT 6.9 7.1 6.7 6.3*  ALBUMIN 3.8 3.3* 3.1* 2.7*   Recent Labs  Lab 05/15/18 2311 05/17/18 0929  LIPASE 31 27   No results for input(s): AMMONIA in the last 168 hours. Coagulation Profile: No results for input(s): INR, PROTIME in the last 168 hours. Cardiac Enzymes: No results for input(s): CKTOTAL, CKMB, CKMBINDEX, TROPONINI in the last 168 hours. BNP (last 3 results) No results for input(s): PROBNP in the last 8760 hours. HbA1C: No results for input(s): HGBA1C in the last 72 hours. CBG: No results for input(s): GLUCAP in the last 168 hours. Lipid Profile: No results for input(s): CHOL, HDL, LDLCALC, TRIG, CHOLHDL, LDLDIRECT in the last 72 hours. Thyroid Function Tests: No results for input(s): TSH, T4TOTAL, FREET4, T3FREE, THYROIDAB in the last 72 hours. Anemia Panel: No results for input(s): VITAMINB12, FOLATE, FERRITIN, TIBC, IRON, RETICCTPCT in the last 72 hours. Urine analysis:    Component Value Date/Time   COLORURINE STRAW (A) 05/17/2018 0915   APPEARANCEUR CLEAR 05/17/2018 0915   LABSPEC 1.003 (L) 05/17/2018 0915   PHURINE 6.0 05/17/2018 0915   GLUCOSEU NEGATIVE 05/17/2018 0915   HGBUR SMALL (A) 05/17/2018 0915   BILIRUBINUR NEGATIVE 05/17/2018 0915   KETONESUR 5 (A) 05/17/2018 0915   PROTEINUR NEGATIVE 05/17/2018 0915   NITRITE NEGATIVE 05/17/2018 0915   LEUKOCYTESUR NEGATIVE 05/17/2018 0915   Sepsis Labs: @LABRCNTIP (procalcitonin:4,lacticidven:4)  ) Recent Results (from the past 240 hour(s))  Surgical pcr screen     Status: None   Collection Time: 05/17/18  4:45 PM  Result Value Ref Range Status   MRSA, PCR NEGATIVE NEGATIVE Final    Staphylococcus aureus NEGATIVE NEGATIVE Final    Comment: (NOTE) The Xpert SA Assay (FDA approved for NASAL specimens in patients 37 years of age and older), is one component of a comprehensive surveillance program. It is not intended to diagnose infection nor to guide or monitor treatment. Performed at [facility], [address] [place], [place] [address]          Radiology Studies: Dg Chest 2 View  Result Date: 05/20/2018 CLINICAL DATA:  72 year old female with a history of hypoxia EXAM: CHEST - 2 VIEW COMPARISON:  05/17/2018 FINDINGS: Cardiomediastinal silhouette unchanged in size and contour, partially obscured by overlying lung/pleural disease. Opacity at the bilateral lung bases with obscuration of the hemidiaphragms and heart borders. Meniscus on the lateral view. No pneumothorax. IMPRESSION: New bilateral pleural effusions, larger on the right. Associated atelectasis/consolidation Electronically Signed   By: Corrie Mckusick D.O.   On: 05/20/2018 12:39   Dg Abd 1 View  Result  Date: 05/20/2018 CLINICAL DATA:  72 year old female status post cholecystectomy. Abdominal an pain and nausea EXAM: ABDOMEN - 1 VIEW COMPARISON:  CT 05/16/2018 FINDINGS: Partially distended small bowel loops and colon. Surgical changes of cholecystectomy. No unexpected radiopaque foreign body. Degenerative changes of the spine. IMPRESSION: Mild dilation of small bowel loops, most likely ileus given recent cholecystectomy Electronically Signed   By: Corrie Mckusick D.O.   On: 05/20/2018 12:20        Scheduled Meds: . aspirin  81 mg Oral Daily  . benazepril  40 mg Oral Daily  . docusate sodium  100 mg Oral BID  . heparin  5,000 Units Subcutaneous Q8H  . metoprolol succinate  50 mg Oral Daily   Continuous Infusions: . 0.9 % NaCl with KCl 20 mEq / L 75 mL/hr at 05/19/18 2225  . famotidine (PEPCID) IV Stopped (05/20/18 1406)  . piperacillin-tazobactam (ZOSYN)  IV Stopped (05/20/18 1202)     LOS: 3 days      Time spent: 35 minutes. Greater than 50% of this time was spent in direct contact with the patient and with patient's family, coordinating care and discussing relevant ongoing clinical issues, including potential etiologies for her increased abdominal pain and emesis status post laparoscopic cholecystectomy, plans to treat atrial fibrillation with increased doses of medication, plan to order chest x-ray to evaluate hypoxemia.Lelon Frohlich, MD [facility] Pager [phone]  If 7PM-7AM, please contact night-coverage [email or web address] Password [facility] 05/20/2018, 4:33 PM

## 2018-05-20 NOTE — Progress Notes (Signed)
Patient's HR has been improved for this shift. At the beginning of the shift, HR was in the 120s, but HR decreased to the low 100s at the most at HS. HR was 120s again this AM after bathing and with pain. This RN ambulated with patient to nurse's station. Patient stated that she felt fine, but SpO2 was only 80-86% on RA. HR no higher than 120s. Patient denied SOB, no respiratory distress noted. Pain was rated at 4/10 at that time. Patient assisted back to her room. While sitting on edge of bed SpO2 87% on RA, then slowly increased to 98% on 2L [place]. Will continue to monitor.

## 2018-05-20 NOTE — Care Management Important Message (Signed)
Important Message  Patient Details  Name: Tara Floyd MRN: [id number] Date of Birth: 19-Sep-1946   Medicare Important Message Given:  Yes    Shelda Altes 05/20/2018, 11:33 AM

## 2018-05-20 NOTE — Progress Notes (Signed)
Incentive Spirometry performed. The patient had fair effort at this time getting only 750 mL. Instructed to cont 10x  every hour.

## 2018-05-20 NOTE — Progress Notes (Signed)
Notified doctor because patient has been given Zofran and Phenegran for nausea but is now vomiting.

## 2018-05-21 MED ORDER — DOCUSATE SODIUM 100 MG PO CAPS: 100.0000 mg | ORAL_CAPSULE | Freq: Two times a day (BID) | ORAL | 0 refills | Status: DC

## 2018-05-21 NOTE — Progress Notes (Signed)
Discharge instructions gone over with patient and spouse, verbalized understanding. IV removed by NT, patient tolerated procedure well.

## 2018-05-21 NOTE — Discharge Instructions (Signed)
Will be discharged home today. Advised to follow up with PCP in 2 weeks and with Dr. Constance Haw as scheduled     Discharge Instructions: Shower per your regular routine. Take tylenol and ibuprofen as needed for pain control, alternating every 4-6 hours.  Take Roxicodone for breakthrough pain. Take colace for constipation related to narcotic pain medication. Do not pick at the dermabond glue on your incision sites.  Do your incentive spirometry at home to keep your lungs open after surgery.  Diet as tolerated but you may want small meals. Hydration is the most important thing post operative.   Laparoscopic Cholecystectomy, Care After This sheet gives you information about how to care for yourself after your procedure. Your doctor may also give you more specific instructions. If you have problems or questions, contact your doctor. Follow these instructions at home: Care for cuts from surgery (incisions)   Follow instructions from your doctor about how to take care of your cuts from surgery. Make sure you: ? Wash your hands with soap and water before you change your bandage (dressing). If you cannot use soap and water, use hand sanitizer. ? Change your bandage as told by your doctor. ? Leave stitches (sutures), skin glue, or skin tape (adhesive) strips in place. They may need to stay in place for 2 weeks or longer. If tape strips get loose and curl up, you may trim the loose edges. Do not remove tape strips completely unless your doctor says it is okay.  Do not take baths, swim, or use a hot tub until your doctor says it is okay.   Check your surgical cut area every day for signs of infection. Check for: ? More redness, swelling, or pain. ? More fluid or blood. ? Warmth. ? Pus or a bad smell. Activity  Do not drive or use heavy machinery while taking prescription pain medicine.  Do not lift anything that is heavier than 10 lb (4.5 kg) until your doctor says it is okay.  Do not play  contact sports until your doctor says it is okay.  Do not drive for 24 hours if you were given a medicine to help you relax (sedative).  Rest as needed. Do not return to work or school until your doctor says it is okay. General instructions  Take over-the-counter and prescription medicines only as told by your doctor.  To prevent or treat constipation while you are taking prescription pain medicine, your doctor may recommend that you: ? Drink enough fluid to keep your pee (urine) clear or pale yellow. ? Take over-the-counter or prescription medicines. ? Eat foods that are high in fiber, such as fresh fruits and vegetables, whole grains, and beans. ? Limit foods that are high in fat and processed sugars, such as fried and sweet foods. Contact a doctor if:  You develop a rash.  You have more redness, swelling, or pain around your surgical cuts.  You have more fluid or blood coming from your surgical cuts.  Your surgical cuts feel warm to the touch.  You have pus or a bad smell coming from your surgical cuts.  You have a fever.  One or more of your surgical cuts breaks open. Get help right away if:  You have trouble breathing.  You have chest pain.  You have pain that is getting worse in your shoulders.  You faint or feel dizzy when you stand.  You have very bad pain in your belly (abdomen).  You are sick to your  stomach (nauseous) for more than one day.  You have throwing up (vomiting) that lasts for more than one day.  You have leg pain. This information is not intended to replace advice given to you by your health care provider. Make sure you discuss any questions you have with your health care provider. Document Released: 07/07/2008 Document Revised: 04/18/2016 Document Reviewed: 03/16/2016 Elsevier Interactive Patient Education  2018 [employer].

## 2018-05-21 NOTE — Progress Notes (Signed)
[facility] Progress Note  3 Days Post-Op  Subjective: Had BM x2 this AM. No further nausea/vomiting. Did get delirious overnight and pulled out IV she reports. Better now.   Objective: Vital signs in last 24 hours: Temp:  [98.5 F (36.9 C)-98.9 F (37.2 C)] 98.5 F (36.9 C) (08/10 0502) Pulse Rate:  [105-132] 105 (08/10 0502) Resp:  [16-18] 16 (08/10 0502) BP: (120-128)/(62-75) 128/75 (08/10 0502) SpO2:  [91 %-92 %] 92 % (08/10 0502) Last BM Date: 05/21/18  Intake/Output from previous day: 08/09 0701 - 08/10 0700 In: 1070 [P.O.:120; I.V.:850; IV Piggyback:100] Out: -  Intake/Output this shift: No intake/output data recorded.  General appearance: alert, cooperative and no distress Resp: normal work breathing GI: soft, nondistended, port sites with dermabond, appropriately tender Extremities: extremities normal, atraumatic, no cyanosis or edema  Lab Results:  Recent Labs    05/20/18 1149  WBC 14.1*  HGB 12.4  HCT 38.2  PLT 238   BMET Recent Labs    05/20/18 1149  NA 136  K 3.9  CL 98  CO2 31  GLUCOSE 116*  BUN 15  CREATININE 0.67  CALCIUM 8.9     Studies/Results: Dg Chest 2 View  Result Date: 05/20/2018 CLINICAL DATA:  72 year old female with a history of hypoxia EXAM: CHEST - 2 VIEW COMPARISON:  05/17/2018 FINDINGS: Cardiomediastinal silhouette unchanged in size and contour, partially obscured by overlying lung/pleural disease. Opacity at the bilateral lung bases with obscuration of the hemidiaphragms and heart borders. Meniscus on the lateral view. No pneumothorax. IMPRESSION: New bilateral pleural effusions, larger on the right. Associated atelectasis/consolidation Electronically Signed   By: Corrie Mckusick D.O.   On: 05/20/2018 12:39   Dg Abd 1 View  Result Date: 05/20/2018 CLINICAL DATA:  72 year old female status post cholecystectomy. Abdominal an pain and nausea EXAM: ABDOMEN - 1 VIEW COMPARISON:  CT 05/16/2018 FINDINGS: Partially  distended small bowel loops and colon. Surgical changes of cholecystectomy. No unexpected radiopaque foreign body. Degenerative changes of the spine. IMPRESSION: Mild dilation of small bowel loops, most likely ileus given recent cholecystectomy Electronically Signed   By: Corrie Mckusick D.O.   On: 05/20/2018 12:20    Anti-infectives: Anti-infectives (From admission, onward)   Start     Dose/Rate Route Frequency Ordered Stop   05/18/18 0600  cefoTEtan (CEFOTAN) 2 g in sodium chloride 0.9 % 100 mL IVPB     2 g 200 mL/hr over 30 Minutes Intravenous On call to O.R. 05/17/18 1634 05/18/18 1900   05/17/18 1700  piperacillin-tazobactam (ZOSYN) IVPB 3.375 g     3.375 g 12.5 mL/hr over 240 Minutes Intravenous Every 8 hours 05/17/18 1430     05/17/18 1115  piperacillin-tazobactam (ZOSYN) IVPB 3.375 g     3.375 g 100 mL/hr over 30 Minutes Intravenous  Once 05/17/18 1114 05/17/18 1138      Assessment/Plan: Ms. Gipe is a 72 yo POD 3 s/p Lap chole for cholecystitis who has a resolving post operative ileus. A fib under better control. Soft diet, encouraged hydration over anything If tolerates would try to d/c home and do small meals/ and hydration, so that patient not getting into cycle of delirium Dr. Jerilee Hoh making sure A fib better controlled    LOS: 4 days    Virl Cagey 05/21/2018

## 2018-05-21 NOTE — Discharge Summary (Signed)
Physician Discharge Summary  Tara Floyd [id number] DOB: Mar 16, 1946 DOA: 05/17/2018  PCP: Octavio Graves, DO  Admit date: 05/17/2018 Discharge date: 05/21/2018  Time spent: 45 minutes  Recommendations for Outpatient Follow-up:  -Will be discharged home today. -Advised to follow up with PCP in 2 weeks and with Dr. Constance Haw as scheduled.   Discharge Diagnoses:  Principal Problem:   Acute cholecystitis Active Problems:   Essential hypertension, benign   Persistent atrial fibrillation (HCC)   Postoperative ileus (HCC)   Acute hypoxemic respiratory failure ([place])   Discharge Condition: Stable and improved  Filed Weights   05/17/18 1430  Weight: 76.2 kg    History of present illness:  As per Dr. Carles Collet on 8/6: Tara Floyd is a 72 y.o. female with medical history of persistent atrial fibrillation and hypertension presenting with nausea, vomiting, and abdominal pain that began on 05/14/2018.  The patient visited the emergency department on 05/15/2018.  She was treated symptomatically.  CT Angio of the abdomen and pelvis was performed at that time and showed gallbladder wall thickening with pericholecystic fluid and a calculus near the neck of the gallbladder.  There was concerns for acute cholecystitis.  She was offered admission, but the patient did not want to stay in the hospital.  She was discharged in stable condition.  Unfortunately, the patient began having nausea and vomiting with worsening abdominal pain once again once she woke up on the morning of 05/16/2018.  The patient could not wait until her appointment with general surgery.  As result, she represented to the hospital for further evaluation.  She denies any fevers, chills, chest pain, shortness breath, diarrhea, hematochezia, melena. In the emergency department, the patient was afebrile and hemodynamically stable saturating 95% on room air.  Heart rate was 100-110.  The patient was given Zosyn, IV fluids, and antiemetics and  morphine.  Hospital Course:   Acute cholecystitis -On postoperative day 2 developed ileus.  . -Surgery has seen, diet has been advanced and she is now tolerating POs without pain or emesis/nausea. -OK for DC home today.  Persistent atrial fibrillation -Not chronically anticoagulated per patient preference. -Heart rates have been uncontrolled, metoprolol was increased from 25-50. -Better improved now in the 110s.  Acute hypoxemic respiratory failure -Due to ATX, doing well now with IS and not requiring oxygen.  Procedures:  Lap chole   Consultations:  General surgery  Discharge Instructions  Discharge Instructions    Diet - low sodium heart healthy   Complete by:  As directed    Diet - low sodium heart healthy   Complete by:  As directed    Increase activity slowly   Complete by:  As directed    Increase activity slowly   Complete by:  As directed      Allergies as of 05/21/2018      Reactions   Codeine Phosphate Other (See Comments)   "knocks me out"      Medication Floyd    STOP taking these medications   oxyCODONE-acetaminophen 5-325 MG tablet Commonly known as:  PERCOCET/ROXICET     TAKE these medications   ALPRAZolam 0.5 MG tablet Commonly known as:  XANAX Take 0.5 mg by mouth as needed.   aspirin 81 MG tablet Take 81 mg by mouth daily.   benazepril 40 MG tablet Commonly known as:  LOTENSIN Take 1 tablet (40 mg total) by mouth daily.   diclofenac sodium 1 % Gel Commonly known as:  VOLTAREN Apply 1 application  topically 2 (two) times daily as needed.   docusate sodium 100 MG capsule Commonly known as:  COLACE Take 1 capsule (100 mg total) by mouth 2 (two) times daily.   Fish Oil 1200 MG Caps Take 1 capsule by mouth daily.   fluticasone 50 MCG/ACT nasal spray Commonly known as:  FLONASE Place 2 sprays into both nostrils as needed.   ibuprofen 200 MG tablet Commonly known as:  ADVIL,MOTRIN Take 800 mg by mouth every 6 (six) hours as  needed.   metoprolol succinate 50 MG 24 hr tablet Commonly known as:  TOPROL-XL Take 1 tablet (50 mg total) by mouth daily. Take with or immediately following a meal. What changed:    medication strength  See the new instructions.   ondansetron 4 MG tablet Commonly known as:  ZOFRAN Take 1 tablet (4 mg total) by mouth every 6 (six) hours as needed for nausea. What changed:    when to take this  reasons to take this  Another medication with the same name was removed. Continue taking this medication, and follow the directions you see here.   oxyCODONE 5 MG immediate release tablet Commonly known as:  Oxy IR/ROXICODONE Take 0.5-1 tablets (2.5-5 mg total) by mouth every 4 (four) hours as needed for moderate pain.            Durable Medical Equipment  (From admission, onward)         Start     Ordered   05/20/18 0850  For home use only DME oxygen  Once    Comments:  Portable oxygen concentrator  Question Answer Comment  Mode or (Route) Nasal cannula   Liters per Minute 2   Frequency Continuous (stationary and portable oxygen unit needed)   Oxygen conserving device Yes   Oxygen delivery system Gas      05/20/18 0849         Allergies  Allergen Reactions  . Codeine Phosphate Other (See Comments)    "knocks me out"   Follow-up Information    Virl Cagey, MD On 06/02/2018.   Specialty:  General Surgery Why:  10:15 Contact information: [address] [facility] [address] [phone]        Octavio Graves, DO. Schedule an appointment as soon as possible for a visit in 2 week(s).   Contact information: [address] [address] [phone]            The results of significant diagnostics from this hospitalization (including imaging, microbiology, ancillary and laboratory) are listed below for reference.    Significant Diagnostic Studies: Dg Chest 2 View  Result Date: 05/20/2018 CLINICAL DATA:  72 year old female  with a history of hypoxia EXAM: CHEST - 2 VIEW COMPARISON:  05/17/2018 FINDINGS: Cardiomediastinal silhouette unchanged in size and contour, partially obscured by overlying lung/pleural disease. Opacity at the bilateral lung bases with obscuration of the hemidiaphragms and heart borders. Meniscus on the lateral view. No pneumothorax. IMPRESSION: New bilateral pleural effusions, larger on the right. Associated atelectasis/consolidation Electronically Signed   By: Corrie Mckusick D.O.   On: 05/20/2018 12:39   Dg Chest 2 View  Result Date: 05/17/2018 CLINICAL DATA:  RUQ pain beginning on 08/03 with N/V. Former smoker. Patient denies chest pain or SOB. EXAM: CHEST - 2 VIEW COMPARISON:  CT of the abdomen and pelvis on 05/16/2018. FINDINGS: The heart is enlarged. There are no focal consolidations. There is RIGHT costophrenic angle blunting. No pulmonary edema. Visualized portion of the UPPER  abdomen is unremarkable. IMPRESSION: Cardiomegaly.  No evidence for acute  abnormality. Electronically Signed   By: Nolon Nations M.D.   On: 05/17/2018 10:06   Dg Abd 1 View  Result Date: 05/20/2018 CLINICAL DATA:  72 year old female status post cholecystectomy. Abdominal an pain and nausea EXAM: ABDOMEN - 1 VIEW COMPARISON:  CT 05/16/2018 FINDINGS: Partially distended small bowel loops and colon. Surgical changes of cholecystectomy. No unexpected radiopaque foreign body. Degenerative changes of the spine. IMPRESSION: Mild dilation of small bowel loops, most likely ileus given recent cholecystectomy Electronically Signed   By: Corrie Mckusick D.O.   On: 05/20/2018 12:20   US Abdomen Limited  Result Date: 05/17/2018 CLINICAL DATA:  Upper abdominal pain with vomiting EXAM: ULTRASOUND ABDOMEN LIMITED RIGHT UPPER QUADRANT COMPARISON:  CT abdomen and pelvis May 16, 2018 FINDINGS: Gallbladder: Within the gallbladder, there are echogenic foci which move and shadow consistent with cholelithiasis. Largest gallstone measures 1.5 cm in  length. There is evidence of thickening of the gallbladder wall with apparent gallbladder wall edema. There is no appreciable pericholecystic fluid. There is comet tail type artifact along the anterior gallbladder wall suggesting that there may be superimposed gallbladder wall inflammation such as adenomyomatosis or cholesterolosis. No sonographic Murphy sign noted by sonographer. Common bile duct: Diameter: 4 mm. No intrahepatic or extrahepatic biliary duct dilatation. Liver: No focal lesion identified. Within normal limits in parenchymal echogenicity. Portal vein is patent on color Doppler imaging with normal direction of blood flow towards the liver. IMPRESSION: Cholelithiasis with gallbladder wall thickening and apparent gallbladder wall edema. Suspect a degree of cute cholecystitis. There may well be superimposed adenomyomatosis or cholesterolosis in the gallbladder wall given comet tail type artifact anteriorly. Study otherwise unremarkable. Electronically Signed   By: Lowella Grip III M.D.   On: 05/17/2018 09:52   Ct Angio Abd/pel W And/or Wo Contrast  Result Date: 05/16/2018 CLINICAL DATA:  Upper abdominal pain with nausea and vomiting x1 day. Patient vomited 5 times. Pain radiates to the right lower back. EXAM: CTA ABDOMEN AND PELVIS WITHOUT AND WITH CONTRAST TECHNIQUE: Multidetector CT imaging of the abdomen and pelvis was performed using the standard protocol during bolus administration of intravenous contrast. Multiplanar reconstructed images and MIPs were obtained and reviewed to evaluate the vascular anatomy. CONTRAST:  175mL ISOVUE-370 IOPAMIDOL (ISOVUE-370) INJECTION 76% COMPARISON:  None. FINDINGS: VASCULAR Aorta: Mild-to-moderate atherosclerosis of the abdominal aorta without aneurysm or dissection. No significant stenosis. Celiac: Patent without evidence of aneurysm, dissection, vasculitis or significant stenosis. SMA: Atherosclerosis at the origin of the SMA.  No thrombosis noted. Renals:  Atherosclerosis of the origin of the main left renal artery without significant stenosis. A second smaller diminutive left renal artery is noted as well on the left. Single right renal artery is unremarkable. No evidence of fibromuscular dysplasia. No dissection or aneurysm. IMA: Patent Inflow: Patent without evidence of aneurysm, dissection, vasculitis or significant stenosis. Mild-to-moderate atherosclerosis of the common iliac arteries bilaterally. Proximal Outflow: Patent without stenosis, aneurysm or dissection. The visualized superficial and profunda femoral arteries are patent. Veins: Negative Review of the MIP images confirms the above findings. NON-VASCULAR Lower chest: Cardiomegaly with bibasilar dependent atelectasis. No pericardial effusion. Hepatobiliary: Pericholecystic fluid and gallbladder wall thickening with a 13 mm calculus near the neck of the gallbladder. Findings raise concern for acute cholecystitis. Peripheral puddling of contrast consistent with a hemangioma is noted within the right hepatic lobe measuring 14 mm in maximum dimension. Fatty infiltration is noted adjacent to the falciform. Pancreas: Top-normal pancreatic  duct caliber. No acute peripancreatic inflammation. Slightly in homogeneous area of hypo density involving the junction of the pancreatic head and body, series 6/73 is identified, nonspecific possibly related to remote inflammatory change. This area measures 2 cm. Dedicated pancreatic CT or MRI may help for better assessment. Spleen: Normal size spleen. Adrenals/Urinary Tract: Adrenal glands are unremarkable. Kidneys are normal, without renal calculi, focal lesion, or hydronephrosis. Bladder is unremarkable. Stomach/Bowel: Small hiatal hernia. Decompressed stomach with normal small bowel rotation. No bowel obstruction or inflammation. The distal and terminal ileum are normal. Appendix is normal contrast filled. Scattered colonic diverticulosis without acute diverticulitis is  visualized involving the sigmoid colon. Lymphatic: No lymphadenopathy. Reproductive: Uterus is anteverted in appearance and bilateral adnexa are unremarkable. Other: No abdominal wall hernia or abnormality. No abdominopelvic ascites. Musculoskeletal: Degenerative disc disease L1-2 and L2-3. Levoconvex curvature of the upper lumbar spine. No aggressive osseous lesions. IMPRESSION: VASCULAR Aortoiliac and branch vessel atherosclerosis. Patent branch vessels without embolism or significant stenosis. NON-VASCULAR 1. Gallbladder wall thickening with pericholecystic fluid and 13 mm calculus near the neck of the gallbladder. Findings raise concern for acute cholecystitis. 2. 14 mm peripheral puddling of contrast in the right hepatic lobe compatible with a hemangioma. 3. Subtle area of hypodensity involving pancreas at the junction head and body, nonspecific possibly related to stigmata of prior pancreatitis. Subtle lesion is not entirely excluded. This area of hypodensity measures approximately 2 cm. Dedicated nonemergent pancreatic CT or MRI protocol study is suggested. 4. Sigmoid diverticulosis without acute diverticulitis. 5. Levoscoliosis with lumbar spondylosis. Electronically Signed   By: Ashley Royalty M.D.   On: 05/16/2018 01:15    Microbiology: Recent Results (from the past 240 hour(s))  Surgical pcr screen     Status: None   Collection Time: 05/17/18  4:45 PM  Result Value Ref Range Status   MRSA, PCR NEGATIVE NEGATIVE Final   Staphylococcus aureus NEGATIVE NEGATIVE Final    Comment: (NOTE) The Xpert SA Assay (FDA approved for NASAL specimens in patients 24 years of age and older), is one component of a comprehensive surveillance program. It is not intended to diagnose infection nor to guide or monitor treatment. Performed at [facility], [address] [place], [place] [address]      Labs: Basic Metabolic Panel: Recent Labs  Lab 05/15/18 2311 05/17/18 0929 05/18/18 0455 05/20/18 1149  NA  137 136 139 136  K 3.8 3.4* 3.8 3.9  CL 101 103 104 98  CO2 28 27 29 31   GLUCOSE 125* 121* 82 116*  BUN 14 12 13 15   CREATININE 0.77 0.67 0.71 0.67  CALCIUM 9.2 8.6* 8.6* 8.9   Liver Function Tests: Recent Labs  Lab 05/15/18 2311 05/17/18 0929 05/18/18 0455 05/20/18 1149  AST 16 17 13* 17  ALT 15 15 14 22   ALKPHOS 50 46 43 35*  BILITOT 0.9 1.0 1.0 1.1  PROT 6.9 7.1 6.7 6.3*  ALBUMIN 3.8 3.3* 3.1* 2.7*   Recent Labs  Lab 05/15/18 2311 05/17/18 0929  LIPASE 31 27   No results for input(s): AMMONIA in the last 168 hours. CBC: Recent Labs  Lab 05/15/18 2311 05/17/18 0929 05/18/18 0455 05/20/18 1149  WBC 11.5* 11.3* 8.3 14.1*  NEUTROABS 9.2* 8.8*  --  11.9*  HGB 12.7 11.7* 11.8* 12.4  HCT 38.8 36.0 36.2 38.2  MCV 89.2 88.9 90.7 89.9  PLT 216 195 192 238   Cardiac Enzymes: No results for input(s): CKTOTAL, CKMB, CKMBINDEX, TROPONINI in the last 168 hours. BNP:  BNP (last 3 results) No results for input(s): BNP in the last 8760 hours.  ProBNP (last 3 results) No results for input(s): PROBNP in the last 8760 hours.  CBG: No results for input(s): GLUCAP in the last 168 hours.     Signed:  Lelon Frohlich  [facility] Pager: [phone] 05/21/2018, 3:42 PM

## 2018-05-23 ENCOUNTER — Telehealth: Payer: Self-pay | Admitting: General Surgery

## 2018-05-23 NOTE — Telephone Encounter (Signed)
[facility]    Patient called over weekend and left message. Was discharged on Saturday.  After Lap chole on 8/7 complicated by ileus.   Feet and legs swelling.  Pain ok. No fever or chills. Taking in some fluids but decreased and no appetite. Was having increased A fib during hospitalization and had Metoprolol increased by hospitalist.   Told patient to try some Gatorade and ambulate today. If swelling worsening, SOB or feeling worse needs to let us know because may need to get labs, possibly need lasix.  Will let PCP, Dr. Melina Copa know too.   [name], MD [facility] [address] [place], [place] [address] [phone] (office)

## 2018-06-02 ENCOUNTER — Ambulatory Visit (INDEPENDENT_AMBULATORY_CARE_PROVIDER_SITE_OTHER): Payer: Self-pay | Admitting: General Surgery

## 2018-06-02 ENCOUNTER — Encounter: Payer: Self-pay | Admitting: General Surgery

## 2018-06-02 VITALS — BP 158/102 | HR 81 | Temp 98.0°F | Resp 16 | Wt 163.0 lb

## 2018-06-02 DIAGNOSIS — K810 Acute cholecystitis: Secondary | ICD-10-CM

## 2018-06-02 NOTE — Patient Instructions (Addendum)
Activity as tolerated. BP elevated, need to follow up with Dr. Melina Copa if continues. Diet as tolerated.

## 2018-06-02 NOTE — Progress Notes (Signed)
[facility] Note   HPI:  72 y.o. Female presents to clinic for post-op follow-up evaluation after a laparoscopic cholecystectomy. Patient reports she is well but is having some poor appetite. She is staying hydrated. Her BP was up yesterday at Dr. Marisa Hua and up today also (174/116 initially and HR 116). She had walked over from another office, so we rechecked it and things had come down.  She otherwise has no pain or fevers, chills. Dr. Melina Copa had given her some lasix given the leg swelling and she took that for 6 days.   Review of Systems:  No fevers or chills Tolerating diet but poor appetite All other review of systems: otherwise negative   Pathology: Diagnosis Gallbladder - ACUTE CHOLECYSTITIS WITH EXTENSIVE HEMORRHAGE. - CHOLELITHIASIS. - BENIGN REACTIVE LYMPH NODE.  Vital Signs:  BP (!) 158/102   Pulse 81   Temp 98 F (36.7 C) (Temporal)   Resp 16   Wt 163 lb (73.9 kg)   BMI 28.87 kg/m    Physical Exam:  Physical Exam  Constitutional: She is oriented to person, place, and time. She appears well-developed.  Cardiovascular: Normal rate.  Pulmonary/Chest: Effort normal.  Abdominal: Soft. She exhibits no distension. There is no tenderness.  Port sites healed, no erythema or drainage  Musculoskeletal: She exhibits no edema.  Neurological: She is alert and oriented to person, place, and time.  Vitals reviewed.   Laboratory studies: None   Imaging:  None    Assessment:  72 y.o. yo Female s/p laparoscopic cholecystectomy for cholecystitis. Doing fair.  Plan:  - Diet as tolerated, encouraged boost and staying hydrated until appetite back  - Activity as tolerated, can go to gym, would start back slow 20 minutes and work up to 1 hour  - Keep Check on BP and check at home, if staying high let Dr. Melina Copa know  - Follow up PRN   All of the above recommendations were discussed with the patient, and all of patient's questions were answered to her  expressed satisfaction.  [name], MD [facility] [address] [place], [place] [address] [phone] (office)

## 2018-06-07 ENCOUNTER — Ambulatory Visit: Payer: Self-pay | Admitting: General Surgery

## 2018-09-28 NOTE — Progress Notes (Signed)
Cardiology Office Note  Date: 09/29/2018   ID: Tara Floyd, DOB Oct 27, 1945, MRN [id number]  PCP: Octavio Graves, DO  Primary Cardiologist: Rozann Lesches, MD   Chief Complaint  Patient presents with  . Atrial Fibrillation    History of Present Illness: Tara Floyd is a 72 y.o. female last seen in June.  She presents for routine follow-up.  States that she has not felt any significant change in palpitations at rest.  She walks for exercise, has had no exertional chest pain or unusual shortness of breath.  As noted previously she has declined anticoagulation, is taking an aspirin along with Toprol-XL.  Beta-blocker dose was increased around the time of cholecystectomy and hospitalization earlier this year, but she cut the dose back to 25 mg daily.  Past Medical History:  Diagnosis Date  . Allergic rhinitis   . Depression   . DJD (degenerative joint disease)   . Essential hypertension, benign   . PAF (paroxysmal atrial fibrillation) ([place])     Past Surgical History:  Procedure Laterality Date  . CHOLECYSTECTOMY N/A 05/18/2018   Procedure: LAPAROSCOPIC CHOLECYSTECTOMY;  Surgeon: Virl Cagey, MD;  Location: [facility];  Service: General;  Laterality: N/A;  . DILATION AND CURETTAGE OF UTERUS    . TUBAL LIGATION      Current Outpatient Medications  Medication Sig Dispense Refill  . ALPRAZolam (XANAX) 0.5 MG tablet Take 0.5 mg by mouth as needed.     Marland Kitchen aspirin 81 MG tablet Take 81 mg by mouth daily.    . diclofenac sodium (VOLTAREN) 1 % GEL Apply 1 application topically 2 (two) times daily as needed.    . docusate sodium (COLACE) 100 MG capsule Take 1 capsule (100 mg total) by mouth 2 (two) times daily. 10 capsule 0  . fluticasone (FLONASE) 50 MCG/ACT nasal spray Place 2 sprays into both nostrils as needed.     Marland Kitchen ibuprofen (ADVIL,MOTRIN) 200 MG tablet Take 800 mg by mouth every 6 (six) hours as needed.    . metoprolol succinate (TOPROL-XL) 50 MG 24 hr tablet Take 1 tablet  (50 mg total) by mouth daily. Take with or immediately following a meal. 90 tablet 2  . Omega-3 Fatty Acids (FISH OIL) 1200 MG CAPS Take 1 capsule by mouth daily.    . benazepril (LOTENSIN) 20 MG tablet Take 1 tablet (20 mg total) by mouth daily. 90 tablet 2   No current facility-administered medications for this visit.    Allergies:  Codeine phosphate   Social History: The patient  reports that she quit smoking about 29 years ago. Her smoking use included cigarettes. She started smoking about 56 years ago. She has a 13.50 pack-year smoking history. She has never used smokeless tobacco. She reports that she does not drink alcohol or use drugs.   ROS:  Please see the history of present illness. Otherwise, complete review of systems is positive for none.  All other systems are reviewed and negative.   Physical Exam: VS:  BP 124/90   Pulse 96   Ht 5\' 3"  (1.6 m)   Wt 161 lb (73 kg)   SpO2 97%   BMI 28.52 kg/m , BMI Body mass index is 28.52 kg/m.  Wt Readings from Last 3 Encounters:  09/29/18 161 lb (73 kg)  06/02/18 163 lb (73.9 kg)  05/17/18 168 lb (76.2 kg)    General: Patient appears comfortable at rest. HEENT: Conjunctiva and lids normal, oropharynx clear. Neck: Supple, no elevated JVP  or carotid bruits, no thyromegaly. Lungs: Clear to auscultation, nonlabored breathing at rest. Cardiac: Irregularly irregular, no S3 or significant systolic murmur. Abdomen: Soft, nontender, bowel sounds present. Extremities: No pitting edema, distal pulses 2+.  ECG: I personally reviewed the tracing from 05/15/2018 which showed atrial fibrillation with leftward axis and low voltage.  Recent Labwork: 05/20/2018: ALT 22; AST 17; BUN 15; Creatinine, Ser 0.67; Hemoglobin 12.4; Platelets 238; Potassium 3.9; Sodium 136   Other Studies Reviewed Today:  Echocardiogram 02/22/2014: Study Conclusions  - Left ventricle: The cavity size was normal. Wall thickness was increased in a pattern of mild LVH.  Systolic function was normal. The estimated ejection fraction was in the range of 60% to 65%. Wall motion was normal; there were no regional wall motion abnormalities. There was an increased relative contribution of atrial contraction to ventricular filling. Doppler parameters are consistent with abnormal left ventricular relaxation (grade 1 diastolic dysfunction). - Aortic valve: Trileaflet; mildly thickened leaflets. There was no stenosis. - Mitral valve: Mildly thickened leaflets . Mild regurgitation. - Left atrium: The atrium was mildly to moderately dilated. - Tricuspid valve: Mild regurgitation.  Assessment and Plan:  1.  Persistent atrial fibrillation with CHADSVASC score of 3.  She declines anticoagulation and prefers to stay on aspirin along with Toprol-XL.  We have discussed this a number of times.  I plan to increase Toprol-XL dose to 50 mg daily for better heart rate control.  Cannot pursue cardioversion attempt in the absence of anticoagulation.  2.  Essential hypertension, concurrently cut dose of benazepril back to 20 mg daily with increase in Toprol-XL.  Current medicines were reviewed with the patient today.   Disposition: Follow-up in 6 months.  Signed, Satira Sark, MD, [facility] 09/29/2018 9:45 AM    [facility], [place], [place] [address] Phone: [phone]; Fax: [phone]

## 2018-09-29 ENCOUNTER — Ambulatory Visit: Payer: Medicare Other | Admitting: Cardiology

## 2018-09-29 ENCOUNTER — Encounter: Payer: Self-pay | Admitting: Cardiology

## 2018-09-29 VITALS — BP 124/90 | HR 96 | Ht 63.0 in | Wt 161.0 lb

## 2018-09-29 DIAGNOSIS — I4819 Other persistent atrial fibrillation: Secondary | ICD-10-CM | POA: Diagnosis not present

## 2018-09-29 DIAGNOSIS — I10 Essential (primary) hypertension: Secondary | ICD-10-CM

## 2018-09-29 MED ORDER — BENAZEPRIL HCL 20 MG PO TABS: 20.0000 mg | ORAL_TABLET | Freq: Every day | ORAL | 2 refills | Status: DC

## 2018-09-29 MED ORDER — METOPROLOL SUCCINATE ER 50 MG PO TB24: 50.0000 mg | ORAL_TABLET | Freq: Every day | ORAL | 2 refills | Status: DC

## 2018-09-29 NOTE — Patient Instructions (Addendum)
Medication Instructions:   Your physician has recommended you make the following change in your medication:   Decrease benazepril to 20 mg by mouth daily.  Increase metoprolol succinate to 50 mg by mouth daily.  Continue all other medications the same.  Labwork:  NONE  Testing/Procedures:  NONE  Follow-Up:  Your physician recommends that you schedule a follow-up appointment in: 6 months. You will receive a reminder letter in the mail in about 4 months reminding you to call and schedule your appointment. If you don't receive this letter, please contact our office.  Any Other Special Instructions Will Be Listed Below (If Applicable).  If you need a refill on your cardiac medications before your next appointment, please call your pharmacy.

## 2018-12-12 ENCOUNTER — Encounter: Payer: Self-pay | Admitting: Internal Medicine

## 2019-02-16 ENCOUNTER — Ambulatory Visit: Payer: Medicare Other | Admitting: Gastroenterology

## 2019-04-07 ENCOUNTER — Other Ambulatory Visit: Payer: Self-pay | Admitting: Cardiology

## 2019-04-26 ENCOUNTER — Ambulatory Visit: Payer: Medicare Other | Admitting: Gastroenterology

## 2019-05-01 ENCOUNTER — Encounter: Payer: Self-pay | Admitting: Cardiology

## 2019-05-01 ENCOUNTER — Ambulatory Visit: Payer: Medicare Other | Admitting: Cardiology

## 2019-05-01 ENCOUNTER — Other Ambulatory Visit: Payer: Self-pay

## 2019-05-01 VITALS — BP 138/72 | HR 106 | Temp 98.6°F | Ht 63.0 in | Wt 168.0 lb

## 2019-05-01 DIAGNOSIS — I10 Essential (primary) hypertension: Secondary | ICD-10-CM | POA: Diagnosis not present

## 2019-05-01 DIAGNOSIS — I4819 Other persistent atrial fibrillation: Secondary | ICD-10-CM

## 2019-05-01 NOTE — Patient Instructions (Signed)

## 2019-05-01 NOTE — Progress Notes (Signed)
Cardiology Office Note  Date: 05/01/2019   ID: Tara Floyd, Tara Floyd August 28, 1946, MRN [id number]  PCP:  Octavio Graves, DO (Inactive)  Cardiologist:  Rozann Lesches, MD Electrophysiologist:  None   Chief Complaint  Patient presents with  . Atrial Fibrillation    History of Present Illness:  Tara Floyd is a 73 y.o. female last seen in December 2019.  She presents for a routine visit.  She states that she has been doing reasonably well, no sense of palpitations or unusual breathlessness, no exertional chest pain.  Since the gyms have been closed during the pandemic, she has been walking for exercise, usually goes out in the morning for about 45 minutes.  CHADSVASC score is 3.  She has declined anticoagulation.  This remains the case today.  She remains on aspirin at 81 mg daily.  I personally reviewed her ECG today which shows atrial fibrillation with low voltage and decreased R wave progression.  She is on Toprol-XL 50 mg daily.  Heart rate was in the low 90s when I listen to her at rest today.  Past Medical History:  Diagnosis Date  . Allergic rhinitis   . Depression   . DJD (degenerative joint disease)   . Essential hypertension   . PAF (paroxysmal atrial fibrillation) ([place])     Past Surgical History:  Procedure Laterality Date  . CHOLECYSTECTOMY N/A 05/18/2018   Procedure: LAPAROSCOPIC CHOLECYSTECTOMY;  Surgeon: Virl Cagey, MD;  Location: [facility];  Service: General;  Laterality: N/A;  . DILATION AND CURETTAGE OF UTERUS    . TUBAL LIGATION      Current Outpatient Medications  Medication Sig Dispense Refill  . ALPRAZolam (XANAX) 0.5 MG tablet Take 0.5 mg by mouth as needed.     Marland Kitchen aspirin 81 MG tablet Take 81 mg by mouth daily.    . benazepril (LOTENSIN) 10 MG tablet Take 10 mg by mouth daily.    . diclofenac sodium (VOLTAREN) 1 % GEL Apply 1 application topically 2 (two) times daily as needed.    . docusate sodium (COLACE) 100 MG capsule Take 1 capsule (100 mg total)  by mouth 2 (two) times daily. 10 capsule 0  . fluticasone (FLONASE) 50 MCG/ACT nasal spray Place 2 sprays into both nostrils as needed.     Marland Kitchen ibuprofen (ADVIL,MOTRIN) 200 MG tablet Take 800 mg by mouth every 6 (six) hours as needed.    . metoprolol succinate (TOPROL-XL) 50 MG 24 hr tablet Take 1 tablet (50 mg total) by mouth daily. Take with or immediately following a meal. 90 tablet 2   No current facility-administered medications for this visit.    Allergies:  Codeine phosphate   Social History: The patient  reports that she quit smoking about 29 years ago. Her smoking use included cigarettes. She started smoking about 57 years ago. She has a 13.50 pack-year smoking history. She has never used smokeless tobacco. She reports that she does not drink alcohol or use drugs.   ROS:  Please see the history of present illness. Otherwise, complete review of systems is positive for none.  All other systems are reviewed and negative.   Physical Exam: VS:  BP 138/72   Pulse (!) 106   Temp 98.6 F (37 C)   Ht 5\' 3"  (1.6 m)   Wt 168 lb (76.2 kg)   SpO2 98%   BMI 29.76 kg/m , BMI Body mass index is 29.76 kg/m.  Wt Readings from Last 3 Encounters:  05/01/19 168 lb (76.2 kg)  09/29/18 161 lb (73 kg)  06/02/18 163 lb (73.9 kg)    General: Patient appears comfortable at rest. HEENT: Conjunctiva and lids normal, oropharynx clear. Neck: Supple, no elevated JVP or carotid bruits, no thyromegaly. Lungs: Clear to auscultation, nonlabored breathing at rest. Cardiac: Irregularly irregular, no S3 or significant systolic murmur, no pericardial rub. Abdomen: Soft, nontender, bowel sounds present. Extremities: No pitting edema, distal pulses 2+.  ECG:  An ECG dated 05/15/2018 was personally reviewed today and demonstrated:  Atrial fibrillation with leftward axis and low voltage.  Recent Labwork: 05/20/2018: ALT 22; AST 17; BUN 15; Creatinine, Ser 0.67; Hemoglobin 12.4; Platelets 238; Potassium 3.9; Sodium  136   Other Studies Reviewed Today:  Echocardiogram 02/22/2014: Study Conclusions  - Left ventricle: The cavity size was normal. Wall thickness was increased in a pattern of mild LVH. Systolic function was normal. The estimated ejection fraction was in the range of 60% to 65%. Wall motion was normal; there were no regional wall motion abnormalities. There was an increased relative contribution of atrial contraction to ventricular filling. Doppler parameters are consistent with abnormal left ventricular relaxation (grade 1 diastolic dysfunction). - Aortic valve: Trileaflet; mildly thickened leaflets. There was no stenosis. - Mitral valve: Mildly thickened leaflets . Mild regurgitation. - Left atrium: The atrium was mildly to moderately dilated. - Tricuspid valve: Mild regurgitation.  Assessment and Plan:  1.  Permanent atrial fibrillation with CHADSVASC score of 3.  She continues to decline anticoagulation, remains on aspirin 81 mg daily and Toprol-XL 50 mg daily.  ECG reviewed.  No changes were made today.  2.  Essential hypertension with intermittent hypotension.  Benazepril has been cut back further by PCP.  Continue to follow.  Medication Adjustments/Labs and Tests Ordered: Current medicines are reviewed at length with the patient today.  Concerns regarding medicines are outlined above.   Tests Ordered: Orders Placed This Encounter  Procedures  . EKG 12-Lead    Medication Changes: No orders of the defined types were placed in this encounter.   Disposition:  Follow up 6 months in the [place] office.  Signed, Satira Sark, MD, [facility] 05/01/2019 8:38 AM    [facility] [place], [place], [place] [address] Phone: [phone]; Fax: [phone]

## 2019-05-02 ENCOUNTER — Encounter (INDEPENDENT_AMBULATORY_CARE_PROVIDER_SITE_OTHER): Payer: Self-pay | Admitting: *Deleted

## 2019-06-12 ENCOUNTER — Other Ambulatory Visit (INDEPENDENT_AMBULATORY_CARE_PROVIDER_SITE_OTHER): Payer: Self-pay | Admitting: *Deleted

## 2019-06-12 DIAGNOSIS — Z1211 Encounter for screening for malignant neoplasm of colon: Secondary | ICD-10-CM

## 2019-06-20 ENCOUNTER — Ambulatory Visit: Payer: Medicare Other | Admitting: Gastroenterology

## 2019-07-10 ENCOUNTER — Ambulatory Visit (INDEPENDENT_AMBULATORY_CARE_PROVIDER_SITE_OTHER): Payer: Medicare Other | Admitting: Otolaryngology

## 2019-07-10 DIAGNOSIS — H6121 Impacted cerumen, right ear: Secondary | ICD-10-CM

## 2019-07-10 DIAGNOSIS — H9123 Sudden idiopathic hearing loss, bilateral: Secondary | ICD-10-CM

## 2019-07-13 ENCOUNTER — Ambulatory Visit (INDEPENDENT_AMBULATORY_CARE_PROVIDER_SITE_OTHER): Payer: Medicare Other | Admitting: Otolaryngology

## 2019-07-13 DIAGNOSIS — H832X3 Labyrinthine dysfunction, bilateral: Secondary | ICD-10-CM

## 2019-07-13 DIAGNOSIS — R42 Dizziness and giddiness: Secondary | ICD-10-CM | POA: Diagnosis not present

## 2019-07-20 ENCOUNTER — Ambulatory Visit (INDEPENDENT_AMBULATORY_CARE_PROVIDER_SITE_OTHER): Payer: Medicare Other | Admitting: Otolaryngology

## 2019-07-20 DIAGNOSIS — H832X3 Labyrinthine dysfunction, bilateral: Secondary | ICD-10-CM

## 2019-07-20 DIAGNOSIS — R42 Dizziness and giddiness: Secondary | ICD-10-CM | POA: Diagnosis not present

## 2019-07-27 ENCOUNTER — Ambulatory Visit (INDEPENDENT_AMBULATORY_CARE_PROVIDER_SITE_OTHER): Payer: Medicare Other | Admitting: Otolaryngology

## 2019-07-27 DIAGNOSIS — H832X3 Labyrinthine dysfunction, bilateral: Secondary | ICD-10-CM

## 2019-07-27 DIAGNOSIS — R42 Dizziness and giddiness: Secondary | ICD-10-CM

## 2019-08-15 ENCOUNTER — Encounter (INDEPENDENT_AMBULATORY_CARE_PROVIDER_SITE_OTHER): Payer: Self-pay | Admitting: *Deleted

## 2019-08-15 ENCOUNTER — Other Ambulatory Visit (INDEPENDENT_AMBULATORY_CARE_PROVIDER_SITE_OTHER): Payer: Self-pay | Admitting: *Deleted

## 2019-08-15 ENCOUNTER — Telehealth (INDEPENDENT_AMBULATORY_CARE_PROVIDER_SITE_OTHER): Payer: Self-pay | Admitting: *Deleted

## 2019-08-15 DIAGNOSIS — Z1211 Encounter for screening for malignant neoplasm of colon: Secondary | ICD-10-CM

## 2019-08-15 MED ORDER — PEG 3350-KCL-NA BICARB-NACL 420 G PO SOLR: 4000.0000 mL | Freq: Once | ORAL | 0 refills | Status: AC

## 2019-08-15 NOTE — Telephone Encounter (Signed)
Patient needs trilyte TCS sch'd 12/16 

## 2019-08-22 HISTORY — PX: VAGINAL HYSTERECTOMY: SUR661

## 2019-08-30 ENCOUNTER — Ambulatory Visit (INDEPENDENT_AMBULATORY_CARE_PROVIDER_SITE_OTHER): Payer: Self-pay

## 2019-08-30 ENCOUNTER — Other Ambulatory Visit: Payer: Self-pay

## 2019-08-30 ENCOUNTER — Telehealth (INDEPENDENT_AMBULATORY_CARE_PROVIDER_SITE_OTHER): Payer: Self-pay | Admitting: *Deleted

## 2019-08-30 NOTE — Telephone Encounter (Signed)
Referring MD/PCP: butler   Procedure: tcs  Reason/Indication:  screening  Has patient had this procedure before?  no  If so, when, by whom and where?    Is there a family history of colon cancer?  no  Who?  What age when diagnosed?    Is patient diabetic?   no      Does patient have prosthetic heart valve or mechanical valve?  no  Do you have a pacemaker/defibrillator?  no  Has patient ever had endocarditis/atrial fibrillation? no  Does patient use oxygen? no  Has patient had joint replacement within last 12 months?  no  Is patient constipated or do they take laxatives? no  Does patient have a history of alcohol/drug use?  no  Is patient on blood thinner such as Coumadin, Plavix and/or Aspirin? yes  Medications: asa 81 mg daily, alprazolam 0.5 mg prn, metoprolol 50 mg daily, benazepril 20 mg daily, advil 200 mg prn  05/23/19 -- NO NEED FOR PROPOFOL PER DR [facility]  Allergies: codeine  Medication Adjustment per Dr Laural Golden: asa 2 days  Procedure date & time: 09/27/19 at 730

## 2019-09-18 NOTE — Telephone Encounter (Signed)
Okay to schedule colonoscopy with conscious sedation 

## 2019-09-25 ENCOUNTER — Other Ambulatory Visit (HOSPITAL_COMMUNITY)
Admission: RE | Admit: 2019-09-25 | Discharge: 2019-09-25 | Disposition: A | Discharge status: Home / Self Care | Payer: Medicare Other | Source: Ambulatory Visit | Attending: Internal Medicine | Admitting: Internal Medicine

## 2019-09-25 ENCOUNTER — Other Ambulatory Visit: Payer: Self-pay

## 2019-09-25 DIAGNOSIS — Z01812 Encounter for preprocedural laboratory examination: Secondary | ICD-10-CM | POA: Insufficient documentation

## 2019-09-25 DIAGNOSIS — Z20828 Contact with and (suspected) exposure to other viral communicable diseases: Secondary | ICD-10-CM | POA: Insufficient documentation

## 2019-09-25 LAB — SARS CORONAVIRUS 2 (TAT 6-24 HRS): SARS Coronavirus 2: NEGATIVE

## 2019-09-27 ENCOUNTER — Other Ambulatory Visit: Payer: Self-pay

## 2019-09-27 ENCOUNTER — Encounter (HOSPITAL_COMMUNITY): Payer: Self-pay | Admitting: Internal Medicine

## 2019-09-27 ENCOUNTER — Encounter (HOSPITAL_COMMUNITY)
Admission: RE | Disposition: A | Discharge status: Home / Self Care | Payer: Self-pay | Source: Home / Self Care | Attending: Internal Medicine

## 2019-09-27 ENCOUNTER — Ambulatory Visit (HOSPITAL_COMMUNITY)
Admission: RE | Admit: 2019-09-27 | Discharge: 2019-09-27 | Disposition: A | Discharge status: Home / Self Care | Payer: Medicare Other | Attending: Internal Medicine | Admitting: Internal Medicine

## 2019-09-27 DIAGNOSIS — Z1211 Encounter for screening for malignant neoplasm of colon: Secondary | ICD-10-CM | POA: Insufficient documentation

## 2019-09-27 DIAGNOSIS — I10 Essential (primary) hypertension: Secondary | ICD-10-CM | POA: Diagnosis not present

## 2019-09-27 DIAGNOSIS — I480 Paroxysmal atrial fibrillation: Secondary | ICD-10-CM | POA: Diagnosis not present

## 2019-09-27 DIAGNOSIS — Z87891 Personal history of nicotine dependence: Secondary | ICD-10-CM | POA: Insufficient documentation

## 2019-09-27 DIAGNOSIS — Z7982 Long term (current) use of aspirin: Secondary | ICD-10-CM | POA: Insufficient documentation

## 2019-09-27 DIAGNOSIS — M1990 Unspecified osteoarthritis, unspecified site: Secondary | ICD-10-CM | POA: Diagnosis not present

## 2019-09-27 DIAGNOSIS — Z885 Allergy status to narcotic agent status: Secondary | ICD-10-CM | POA: Diagnosis not present

## 2019-09-27 DIAGNOSIS — K644 Residual hemorrhoidal skin tags: Secondary | ICD-10-CM | POA: Diagnosis not present

## 2019-09-27 DIAGNOSIS — K6289 Other specified diseases of anus and rectum: Secondary | ICD-10-CM | POA: Insufficient documentation

## 2019-09-27 DIAGNOSIS — F419 Anxiety disorder, unspecified: Secondary | ICD-10-CM | POA: Diagnosis not present

## 2019-09-27 DIAGNOSIS — K5730 Diverticulosis of large intestine without perforation or abscess without bleeding: Secondary | ICD-10-CM | POA: Diagnosis not present

## 2019-09-27 DIAGNOSIS — Z79899 Other long term (current) drug therapy: Secondary | ICD-10-CM | POA: Insufficient documentation

## 2019-09-27 HISTORY — PX: COLONOSCOPY: SHX5424

## 2019-09-27 SURGERY — COLONOSCOPY
Anesthesia: Moderate Sedation

## 2019-09-27 MED ORDER — MIDAZOLAM HCL 5 MG/5ML IJ SOLN
INTRAMUSCULAR | Status: AC
  Filled 2019-09-27: qty 10

## 2019-09-27 MED ORDER — MIDAZOLAM HCL 5 MG/5ML IJ SOLN
INTRAMUSCULAR | Status: DC | PRN
  Administered 2019-09-27: 2 mg via INTRAVENOUS
  Administered 2019-09-27 (×4): 1 mg via INTRAVENOUS
  Administered 2019-09-27: 2 mg via INTRAVENOUS

## 2019-09-27 MED ORDER — MEPERIDINE HCL 50 MG/ML IJ SOLN
INTRAMUSCULAR | Status: AC
  Filled 2019-09-27: qty 1

## 2019-09-27 MED ORDER — SODIUM CHLORIDE 0.9 % IV SOLN: INTRAVENOUS | Status: DC

## 2019-09-27 MED ORDER — STERILE WATER FOR IRRIGATION IR SOLN
Status: DC | PRN
  Administered 2019-09-27: 08:00:00 1.5 mL

## 2019-09-27 MED ORDER — MEPERIDINE HCL 50 MG/ML IJ SOLN
INTRAMUSCULAR | Status: DC | PRN
  Administered 2019-09-27 (×2): 25 mg via INTRAVENOUS

## 2019-09-27 NOTE — H&P (Signed)
Tara Floyd is an 73 y.o. female.   Chief Complaint: Patient is here for colonoscopy. HPI: Patient is 73 year old Caucasian female who is here for screening colonoscopy.  This is patient's first exam.  She denies abdominal pain change in bowel habits or rectal bleeding.  Family history is negative for CRC.  Last aspirin dose was 3 days ago.  Past Medical History:  Diagnosis Date  . Allergic rhinitis   . Depression   . DJD (degenerative joint disease)   . Essential hypertension   . PAF (paroxysmal atrial fibrillation) ([place])     Past Surgical History:  Procedure Laterality Date  . ABDOMINAL HYSTERECTOMY  08/22/2019   vaginal  . CHOLECYSTECTOMY N/A 05/18/2018   Procedure: LAPAROSCOPIC CHOLECYSTECTOMY;  Surgeon: Virl Cagey, MD;  Location: [facility];  Service: General;  Laterality: N/A;  . DILATION AND CURETTAGE OF UTERUS    . TUBAL LIGATION      Family History  Problem Relation Age of Onset  . Parkinson's disease Father   . Brain cancer Mother    Social History:  reports that she quit smoking about 30 years ago. Her smoking use included cigarettes. She started smoking about 57 years ago. She has a 13.50 pack-year smoking history. She has never used smokeless tobacco. She reports that she does not drink alcohol or use drugs.  Allergies:  Allergies  Allergen Reactions  . Codeine Phosphate Other (See Comments)    "knocks me out"    Medications Prior to Admission  Medication Sig Dispense Refill  . ALPRAZolam (XANAX) 0.5 MG tablet Take 0.5 mg by mouth at bedtime as needed for anxiety.     Marland Kitchen aspirin 81 MG tablet Take 81 mg by mouth daily.    . benazepril (LOTENSIN) 10 MG tablet Take 10 mg by mouth daily.    . diclofenac sodium (VOLTAREN) 1 % GEL Apply 1 application topically 2 (two) times daily as needed (pain).     Marland Kitchen diltiazem (CARDIZEM CD) 120 MG 24 hr capsule Take 120 mg by mouth daily.    Marland Kitchen docusate sodium (COLACE) 100 MG capsule Take 1 capsule (100 mg total) by mouth 2 (two)  times daily. (Patient taking differently: Take 100 mg by mouth at bedtime. ) 10 capsule 0  . fluticasone (FLONASE) 50 MCG/ACT nasal spray Place 2 sprays into both nostrils daily as needed for allergies.     . Glycerin-Hypromellose-PEG 400 (DRY EYE RELIEF DROPS) 0.2-0.2-1 % SOLN Place 1 drop into both eyes daily as needed (Dry eye).    Marland Kitchen ibuprofen (ADVIL,MOTRIN) 200 MG tablet Take 800 mg by mouth every 6 (six) hours as needed for headache, mild pain or moderate pain.     Marland Kitchen loratadine (CLARITIN) 10 MG tablet Take 10 mg by mouth daily. Alternate with Montelukast    . montelukast (SINGULAIR) 10 MG tablet Take 10 mg by mouth daily as needed (allergies). Alternate with Loratadine    . metoprolol succinate (TOPROL-XL) 50 MG 24 hr tablet Take 1 tablet (50 mg total) by mouth daily. Take with or immediately following a meal. (Patient not taking: Reported on 09/20/2019) 90 tablet 2    No results found for this or any previous visit (from the past 48 hour(s)). No results found.  Review of Systems  Blood pressure (!) 142/87, pulse (!) 115, temperature 97.6 F (36.4 C), temperature source Oral, resp. rate 20, height 5\' 3"  (1.6 m), weight 76.2 kg, SpO2 100 %. Physical Exam  Constitutional: She appears well-developed and well-nourished.  HENT:  Mouth/Throat: Oropharynx is clear and moist.  Eyes: Conjunctivae are normal. No scleral icterus.  Neck: No thyromegaly present.  Cardiovascular:  Irregular rhythm normal S1 and S2.  No murmur or gallop noted.  Respiratory: Effort normal and breath sounds normal.  GI: Soft. She exhibits no distension and no mass. There is no abdominal tenderness.  Musculoskeletal:        General: No edema.  Lymphadenopathy:    She has no cervical adenopathy.  Neurological: She is alert.  Skin: Skin is warm and dry.     Assessment/Plan Average risk screening colonoscopy.  Hildred Laser, MD 09/27/2019, 7:32 AM

## 2019-09-27 NOTE — Op Note (Signed)
[facility] Patient Name: Tara Floyd Procedure Date: 09/27/2019 7:07 AM MRN: [id number] Date of Birth: 1945/10/17 Attending MD: Hildred Laser , MD CSN: [id number] Age: 73 Admit Type: Outpatient Procedure:                Colonoscopy Indications:              Screening for colorectal malignant neoplasm Providers:                Hildred Laser, MD, Charlsie Quest. Theda Sers RN, RN Referring MD:             Adaline Sill, NP Medicines:                Meperidine 50 mg IV, Midazolam 8 mg IV Complications:            No immediate complications. Estimated Blood Loss:     Estimated blood loss: none. Procedure:                Pre-Anesthesia Assessment:                           - Prior to the procedure, a History and Physical                            was performed, and patient medications and                            allergies were reviewed. The patient's tolerance of                            previous anesthesia was also reviewed. The risks                            and benefits of the procedure and the sedation                            options and risks were discussed with the patient.                            All questions were answered, and informed consent                            was obtained. Prior Anticoagulants: The patient has                            taken no previous anticoagulant or antiplatelet                            agents except for aspirin. ASA Grade Assessment: II                            - A patient with mild systemic disease. After                            reviewing the risks and benefits, the patient was  deemed in satisfactory condition to undergo the                            procedure.                           After obtaining informed consent, the colonoscope                            was passed under direct vision. Throughout the                            procedure, the patient's blood pressure, pulse, and                   oxygen saturations were monitored continuously. The                            [id number] scope was introduced through                            the anus and advanced to the the cecum, identified                            by appendiceal orifice and ileocecal valve. The                            colonoscopy was performed without difficulty. The                            patient tolerated the procedure well. The quality                            of the bowel preparation was excellent. The                            ileocecal valve, appendiceal orifice, and rectum                            were photographed. Scope In: 7:41:57 AM Scope Out: 7:57:53 AM Scope Withdrawal Time: 0 hours 4 minutes 13 seconds  Total Procedure Duration: 0 hours 15 minutes 56 seconds  Findings:      Multiple small and large-mouthed diverticula were found in the sigmoid       colon.      The exam was otherwise normal throughout the examined colon.      External hemorrhoids were found during retroflexion. The hemorrhoids       were small.      Anal papilla(e) were hypertrophied. Impression:               - Diverticulosis in the sigmoid colon.                           - External hemorrhoids.                           - Anal papilla(e) were  hypertrophied.                           - No specimens collected. Moderate Sedation:      Moderate (conscious) sedation was administered by the endoscopy nurse       and supervised by the endoscopist. The following parameters were       monitored: oxygen saturation, heart rate, blood pressure, CO2       capnography and response to care. Total physician intraservice time was       22 minutes. Recommendation:           - Patient has a contact number available for                            emergencies. The signs and symptoms of potential                            delayed complications were discussed with the                            patient.  Return to normal activities tomorrow.                            Written discharge instructions were provided to the                            patient.                           - High fiber diet today.                           - Continue present medications.                           - Resume aspirin at prior dose today.                           - No repeat colonoscopy due to age and the absence                            of advanced adenomas. Procedure Code(s):        --- Professional ---                           [phone], Colonoscopy, flexible; diagnostic, including                            collection of specimen(s) by brushing or washing,                            when performed (separate procedure)                           G0500, Moderate sedation services provided by the  same physician or other qualified health care                            professional performing a gastrointestinal                            endoscopic service that sedation supports,                            requiring the presence of an independent trained                            observer to assist in the monitoring of the                            patient's level of consciousness and physiological                            status; initial 15 minutes of intra-service time;                            patient age 60 years or older (additional time may                            be reported with [phone], as appropriate) Diagnosis Code(s):        --- Professional ---                           Z12.11, Encounter for screening for malignant                            neoplasm of colon                           K64.4, Residual hemorrhoidal skin tags                           K62.89, Other specified diseases of anus and rectum                           K57.30, Diverticulosis of large intestine without                            perforation or abscess without bleeding CPT copyright 2019  American Medical Association. All rights reserved. The codes documented in this report are preliminary and upon coder review may  be revised to meet current compliance requirements. Hildred Laser, MD Hildred Laser, MD 09/27/2019 8:09:08 AM This report has been signed electronically. Number of Addenda: 0

## 2019-09-27 NOTE — Discharge Instructions (Signed)
Resume usual medications including aspirin as before. High-fiber diet. No driving for 24 hours.   Colonoscopy, Adult, Care After This sheet gives you information about how to care for yourself after your procedure. Your doctor may also give you more specific instructions. If you have problems or questions, call your doctor. What can I expect after the procedure? After the procedure, it is common to have:  A small amount of blood in your poop for 24 hours.  Some gas.  Mild cramping or bloating in your belly. Follow these instructions at home: General instructions  For the first 24 hours after the procedure: ? Do not drive or use machinery. ? Do not sign important documents. ? Do not drink alcohol. ? Do your daily activities more slowly than normal. ? Eat foods that are soft and easy to digest.  Take over-the-counter or prescription medicines only as told by your doctor. To help cramping and bloating:   Try walking around.  Put heat on your belly (abdomen) as told by your doctor. Use a heat source that your doctor recommends, such as a moist heat pack or a heating pad. ? Put a towel between your skin and the heat source. ? Leave the heat on for 20-30 minutes. ? Remove the heat if your skin turns bright red. This is especially important if you cannot feel pain, heat, or cold. You can get burned. Eating and drinking   Drink enough fluid to keep your pee (urine) clear or pale yellow.  Return to your normal diet as told by your doctor. Avoid heavy or fried foods that are hard to digest.  Avoid drinking alcohol for as long as told by your doctor. Contact a doctor if:  You have blood in your poop (stool) 2-3 days after the procedure. Get help right away if:  You have more than a small amount of blood in your poop.  You see large clumps of tissue (blood clots) in your poop.  Your belly is swollen.  You feel sick to your stomach (nauseous).  You throw up (vomit).  You  have a fever.  You have belly pain that gets worse, and medicine does not help your pain. Summary  After the procedure, it is common to have a small amount of blood in your poop. You may also have mild cramping and bloating in your belly.  For the first 24 hours after the procedure, do not drive or use machinery, do not sign important documents, and do not drink alcohol.  Get help right away if you have a lot of blood in your poop, feel sick to your stomach, have a fever, or have more belly pain. This information is not intended to replace advice given to you by your health care provider. Make sure you discuss any questions you have with your health care provider. Document Released: 10/31/2010 Document Revised: 07/29/2017 Document Reviewed: 06/22/2016 Elsevier Patient Education  [place].  High-Fiber Diet Fiber, also called dietary fiber, is a type of carbohydrate that is found in fruits, vegetables, whole grains, and beans. A high-fiber diet can have many health benefits. Your health care provider may recommend a high-fiber diet to help:  Prevent constipation. Fiber can make your bowel movements more regular.  Lower your cholesterol.  Relieve the following conditions: ? Swelling of veins in the anus (hemorrhoids). ? Swelling and irritation (inflammation) of specific areas of the digestive tract (uncomplicated diverticulosis). ? A problem of the large intestine (colon) that sometimes causes pain and  diarrhea (irritable bowel syndrome, IBS).  Prevent overeating as part of a weight-loss plan.  Prevent heart disease, type 2 diabetes, and certain cancers. What is my plan? The recommended daily fiber intake in grams (g) includes:  38 g for men age 57 or younger.  30 g for men over age 38.  64 g for women age 23 or younger.  21 g for women over age 75. You can get the recommended daily intake of dietary fiber by:  Eating a variety of fruits, vegetables, grains, and  beans.  Taking a fiber supplement, if it is not possible to get enough fiber through your diet. What do I need to know about a high-fiber diet?  It is better to get fiber through food sources rather than from fiber supplements. There is not a lot of research about how effective supplements are.  Always check the fiber content on the nutrition facts label of any prepackaged food. Look for foods that contain 5 g of fiber or more per serving.  Talk with a diet and nutrition specialist (dietitian) if you have questions about specific foods that are recommended or not recommended for your medical condition, especially if those foods are not listed below.  Gradually increase how much fiber you consume. If you increase your intake of dietary fiber too quickly, you may have bloating, cramping, or gas.  Drink plenty of water. Water helps you to digest fiber. What are tips for following this plan?  Eat a wide variety of high-fiber foods.  Make sure that half of the grains that you eat each day are whole grains.  Eat breads and cereals that are made with whole-grain flour instead of refined flour or white flour.  Eat brown rice, bulgur wheat, or millet instead of white rice.  Start the day with a breakfast that is high in fiber, such as a cereal that contains 5 g of fiber or more per serving.  Use beans in place of meat in soups, salads, and pasta dishes.  Eat high-fiber snacks, such as berries, raw vegetables, nuts, and popcorn.  Choose whole fruits and vegetables instead of processed forms like juice or sauce. What foods can I eat?  Fruits Berries. Pears. Apples. Oranges. Avocado. Prunes and raisins. Dried figs. Vegetables Sweet potatoes. Spinach. Kale. Artichokes. Cabbage. Broccoli. Cauliflower. Green peas. Carrots. Squash. Grains Whole-grain breads. Multigrain cereal. Oats and oatmeal. Brown rice. Barley. Bulgur wheat. [place]. Quinoa. Bran muffins. Popcorn. Rye wafer crackers. Meats  and other proteins Navy, kidney, and pinto beans. Soybeans. Split peas. Lentils. Nuts and seeds. Dairy Fiber-fortified yogurt. Beverages Fiber-fortified soy milk. Fiber-fortified orange juice. Other foods Fiber bars. The items listed above may not be a complete list of recommended foods and beverages. Contact a dietitian for more options. What foods are not recommended? Fruits Fruit juice. Cooked, strained fruit. Vegetables Fried potatoes. Canned vegetables. Well-cooked vegetables. Grains White bread. Pasta made with refined flour. White rice. Meats and other proteins Fatty cuts of meat. Fried chicken or fried fish. Dairy Milk. Yogurt. Cream cheese. Sour cream. Fats and oils Butters. Beverages Soft drinks. Other foods Cakes and pastries. The items listed above may not be a complete list of foods and beverages to avoid. Contact a dietitian for more information. Summary  Fiber is a type of carbohydrate. It is found in fruits, vegetables, whole grains, and beans.  There are many health benefits of eating a high-fiber diet, such as preventing constipation, lowering blood cholesterol, helping with weight loss, and reducing your risk  of heart disease, diabetes, and certain cancers.  Gradually increase your intake of fiber. Increasing too fast can result in cramping, bloating, and gas. Drink plenty of water while you increase your fiber.  The best sources of fiber include whole fruits and vegetables, whole grains, nuts, seeds, and beans. This information is not intended to replace advice given to you by your health care provider. Make sure you discuss any questions you have with your health care provider. Document Released: 09/28/2005 Document Revised: 08/02/2017 Document Reviewed: 08/02/2017 Elsevier Patient Education  2020 [employer].   Hemorrhoids Hemorrhoids are swollen veins that may develop:  In the butt (rectum). These are called internal hemorrhoids.  Around the  opening of the butt (anus). These are called external hemorrhoids. Hemorrhoids can cause pain, itching, or bleeding. Most of the time, they do not cause serious problems. They usually get better with diet changes, lifestyle changes, and other home treatments. What are the causes? This condition may be caused by:  Having trouble pooping (constipation).  Pushing hard (straining) to poop.  Watery poop (diarrhea).  Pregnancy.  Being very overweight (obese).  Sitting for long periods of time.  Heavy lifting or other activity that causes you to strain.  Anal sex.  Riding a bike for a long period of time. What are the signs or symptoms? Symptoms of this condition include:  Pain.  Itching or soreness in the butt.  Bleeding from the butt.  Leaking poop.  Swelling in the area.  One or more lumps around the opening of your butt. How is this diagnosed? A doctor can often diagnose this condition by looking at the affected area. The doctor may also:  Do an exam that involves feeling the area with a gloved hand (digital rectal exam).  Examine the area inside your butt using a small tube (anoscope).  Order blood tests. This may be done if you have lost a lot of blood.  Have you get a test that involves looking inside the colon using a flexible tube with a camera on the end (sigmoidoscopy or colonoscopy). How is this treated? This condition can usually be treated at home. Your doctor may tell you to change what you eat, make lifestyle changes, or try home treatments. If these do not help, procedures can be done to remove the hemorrhoids or make them smaller. These may involve:  Placing rubber bands at the base of the hemorrhoids to cut off their blood supply.  Injecting medicine into the hemorrhoids to shrink them.  Shining a type of light energy onto the hemorrhoids to cause them to fall off.  Doing surgery to remove the hemorrhoids or cut off their blood supply. Follow these  instructions at home: Eating and drinking   Eat foods that have a lot of fiber in them. These include whole grains, beans, nuts, fruits, and vegetables.  Ask your doctor about taking products that have added fiber (fibersupplements).  Reduce the amount of fat in your diet. You can do this by: ? Eating low-fat dairy products. ? Eating less red meat. ? Avoiding processed foods.  Drink enough fluid to keep your pee (urine) pale yellow. Managing pain and swelling   Take a warm-water bath (sitz bath) for 20 minutes to ease pain. Do this 3-4 times a day. You may do this in a bathtub or using a portable sitz bath that fits over the toilet.  If told, put ice on the painful area. It may be helpful to use ice between your  warm baths. ? Put ice in a plastic bag. ? Place a towel between your skin and the bag. ? Leave the ice on for 20 minutes, 2-3 times a day. General instructions  Take over-the-counter and prescription medicines only as told by your doctor. ? Medicated creams and medicines may be used as told.  Exercise often. Ask your doctor how much and what kind of exercise is best for you.  Go to the bathroom when you have the urge to poop. Do not wait.  Avoid pushing too hard when you poop.  Keep your butt dry and clean. Use wet toilet paper or moist towelettes after pooping.  Do not sit on the toilet for a long time.  Keep all follow-up visits as told by your doctor. This is important. Contact a doctor if you:  Have pain and swelling that do not get better with treatment or medicine.  Have trouble pooping.  Cannot poop.  Have pain or swelling outside the area of the hemorrhoids. Get help right away if you have:  Bleeding that will not stop. Summary  Hemorrhoids are swollen veins in the butt or around the opening of the butt.  They can cause pain, itching, or bleeding.  Eat foods that have a lot of fiber in them. These include whole grains, beans, nuts, fruits, and  vegetables.  Take a warm-water bath (sitz bath) for 20 minutes to ease pain. Do this 3-4 times a day. This information is not intended to replace advice given to you by your health care provider. Make sure you discuss any questions you have with your health care provider. Document Released: 07/07/2008 Document Revised: 10/06/2018 Document Reviewed: 02/17/2018 Elsevier Patient Education  2020 [employer].  Diverticulosis  Diverticulosis is a condition that develops when small pouches (diverticula) form in the wall of the large intestine (colon). The colon is where water is absorbed and stool is formed. The pouches form when the inside layer of the colon pushes through weak spots in the outer layers of the colon. You may have a few pouches or many of them. What are the causes? The cause of this condition is not known. What increases the risk? The following factors may make you more likely to develop this condition:  Being older than age 65. Your risk for this condition increases with age. Diverticulosis is rare among people younger than age 110. By age 27, many people have it.  Eating a low-fiber diet.  Having frequent constipation.  Being overweight.  Not getting enough exercise.  Smoking.  Taking over-the-counter pain medicines, like aspirin and ibuprofen.  Having a family history of diverticulosis. What are the signs or symptoms? In most people, there are no symptoms of this condition. If you do have symptoms, they may include:  Bloating.  Cramps in the abdomen.  Constipation or diarrhea.  Pain in the lower left side of the abdomen. How is this diagnosed? This condition is most often diagnosed during an exam for other colon problems. Because diverticulosis usually has no symptoms, it often cannot be diagnosed independently. This condition may be diagnosed by:  Using a flexible scope to examine the colon (colonoscopy).  Taking an X-ray of the colon after dye has been put  into the colon (barium enema).  Doing a CT scan. How is this treated? You may not need treatment for this condition if you have never developed an infection related to diverticulosis. If you have had an infection before, treatment may include:  Eating a high-fiber  diet. This may include eating more fruits, vegetables, and grains.  Taking a fiber supplement.  Taking a live bacteria supplement (probiotic).  Taking medicine to relax your colon.  Taking antibiotic medicines. Follow these instructions at home:  Drink 6-8 glasses of water or more each day to prevent constipation.  Try not to strain when you have a bowel movement.  If you have had an infection before: ? Eat more fiber as directed by your health care provider or your diet and nutrition specialist (dietitian). ? Take a fiber supplement or probiotic, if your health care provider approves.  Take over-the-counter and prescription medicines only as told by your health care provider.  If you were prescribed an antibiotic, take it as told by your health care provider. Do not stop taking the antibiotic even if you start to feel better.  Keep all follow-up visits as told by your health care provider. This is important. Contact a health care provider if:  You have pain in your abdomen.  You have bloating.  You have cramps.  You have not had a bowel movement in 3 days. Get help right away if:  Your pain gets worse.  Your bloating becomes very bad.  You have a fever or chills, and your symptoms suddenly get worse.  You vomit.  You have bowel movements that are bloody or black.  You have bleeding from your rectum. Summary  Diverticulosis is a condition that develops when small pouches (diverticula) form in the wall of the large intestine (colon).  You may have a few pouches or many of them.  This condition is most often diagnosed during an exam for other colon problems.  If you have had an infection related to  diverticulosis, treatment may include increasing the fiber in your diet, taking supplements, or taking medicines. This information is not intended to replace advice given to you by your health care provider. Make sure you discuss any questions you have with your health care provider. Document Released: 06/25/2004 Document Revised: 09/10/2017 Document Reviewed: 08/17/2016 Elsevier Patient Education  2020 [employer].

## 2019-10-26 ENCOUNTER — Telehealth: Payer: Self-pay | Admitting: Cardiology

## 2019-10-26 NOTE — Telephone Encounter (Signed)

## 2019-10-30 ENCOUNTER — Encounter: Payer: Self-pay | Admitting: Cardiology

## 2019-10-30 NOTE — Progress Notes (Signed)
Virtual Visit via Telephone Note   This visit type was conducted due to national recommendations for restrictions regarding the COVID-19 Pandemic (e.g. social distancing) in an effort to limit this patient's exposure and mitigate transmission in our community.  Due to her co-morbid illnesses, this patient is at least at moderate risk for complications without adequate follow up.  This format is felt to be most appropriate for this patient at this time.  The patient did not have access to video technology/had technical difficulties with video requiring transitioning to audio format only (telephone).  All issues noted in this document were discussed and addressed.  No physical exam could be performed with this format.  Please refer to the patient's chart for her  consent to telehealth for [facility].   Date:  10/31/2019   ID:  Tara Floyd, DOB 11-15-1945, MRN [id number]  Patient Location: Home Provider Location: Office  PCP:  Adaline Sill, NP  Cardiologist:  Rozann Lesches, MD Electrophysiologist:  None   Evaluation Performed:  Follow-Up Visit  Chief Complaint:  Cardiac follow-up  History of Present Illness:    Tara Floyd is a 74 y.o. female last seen in July 2020.  We spoke by phone today.  From a cardiac perspective, she does not report any sense of palpitations or chest pain.  CHA2DS2-VASc score is 3, she has declined anticoagulation so far, we continue to address this question.  She continues on low-dose aspirin and Cardizem CD.  She tells me that she had a hysterectomy back in November 2020, screening colonoscopy in December 2020, and diagnosis of COVID-19 along with her husband in early January.  She states that she had a relatively mild case, was not hospitalized.  She tells me that both she and her husband are gradually feeling better.   Past Medical History:  Diagnosis Date  . Allergic rhinitis   . Depression   . DJD (degenerative joint disease)   . Essential  hypertension   . PAF (paroxysmal atrial fibrillation) ([place])    Past Surgical History:  Procedure Laterality Date  . CHOLECYSTECTOMY N/A 05/18/2018   Procedure: LAPAROSCOPIC CHOLECYSTECTOMY;  Surgeon: Virl Cagey, MD;  Location: [facility]S;  Service: General;  Laterality: N/A;  . COLONOSCOPY N/A 09/27/2019   Procedure: COLONOSCOPY;  Surgeon: Rogene Houston, MD;  Location: [facility] SUITE;  Service: Endoscopy;  Laterality: N/A;  730  . DILATION AND CURETTAGE OF UTERUS    . TUBAL LIGATION    . VAGINAL HYSTERECTOMY  08/22/2019     Current Meds  Medication Sig  . ALPRAZolam (XANAX) 0.5 MG tablet Take 0.5 mg by mouth at bedtime as needed for anxiety.   Marland Kitchen aspirin 81 MG tablet Take 81 mg by mouth daily.  . benazepril (LOTENSIN) 10 MG tablet Take 10 mg by mouth daily.  . diclofenac sodium (VOLTAREN) 1 % GEL Apply 1 application topically 2 (two) times daily as needed (pain).   Marland Kitchen diltiazem (CARDIZEM CD) 120 MG 24 hr capsule Take 120 mg by mouth daily.  Marland Kitchen docusate sodium (COLACE) 100 MG capsule Take 1 capsule (100 mg total) by mouth 2 (two) times daily. (Patient taking differently: Take 100 mg by mouth at bedtime. )  . fluticasone (FLONASE) 50 MCG/ACT nasal spray Place 2 sprays into both nostrils daily as needed for allergies.   . Glycerin-Hypromellose-PEG 400 (DRY EYE RELIEF DROPS) 0.2-0.2-1 % SOLN Place 1 drop into both eyes daily as needed (Dry eye).  Marland Kitchen ibuprofen (ADVIL,MOTRIN) 200 MG  tablet Take 800 mg by mouth every 6 (six) hours as needed for headache, mild pain or moderate pain.   . montelukast (SINGULAIR) 10 MG tablet Take 10 mg by mouth daily as needed (allergies). Alternate with Loratadine     Allergies:   Codeine phosphate   Social History   Tobacco Use  . Smoking status: Former Smoker    Packs/day: 0.50    Years: 27.00    Pack years: 13.50    Types: Cigarettes    Start date: 10/21/1961    Quit date: 09/30/1989    Years since quitting: 30.1  . Smokeless tobacco: Never Used    Substance Use Topics  . Alcohol use: No    Alcohol/week: 0.0 standard drinks  . Drug use: No     Family Hx: The patient's family history includes Brain cancer in her mother; Parkinson's disease in her father.  ROS:   Please see the history of present illness. All other systems reviewed and are negative.   Prior CV studies:   The following studies were reviewed today:  Echocardiogram 02/22/2014: Study Conclusions  - Left ventricle: The cavity size was normal. Wall thickness was increased in a pattern of mild LVH. Systolic function was normal. The estimated ejection fraction was in the range of 60% to 65%. Wall motion was normal; there were no regional wall motion abnormalities. There was an increased relative contribution of atrial contraction to ventricular filling. Doppler parameters are consistent with abnormal left ventricular relaxation (grade 1 diastolic dysfunction). - Aortic valve: Trileaflet; mildly thickened leaflets. There was no stenosis. - Mitral valve: Mildly thickened leaflets . Mild regurgitation. - Left atrium: The atrium was mildly to moderately dilated. - Tricuspid valve: Mild regurgitation.  Labs/Other Tests and Data Reviewed:    EKG:  An ECG dated 05/01/2019 was personally reviewed today and demonstrated:  Atrial fibrillation with low voltage and decreased R wave progression.  Recent Labs:  No interval lab work for review today.  Wt Readings from Last 3 Encounters:  10/31/19 165 lb 3.2 oz (74.9 kg)  09/27/19 168 lb (76.2 kg)  05/01/19 168 lb (76.2 kg)     Objective:    Vital Signs:  BP (!) 121/94   Pulse 85   Temp 97.7 F (36.5 C)   Ht 5\' 3"  (1.6 m)   Wt 165 lb 3.2 oz (74.9 kg)   SpO2 98%   BMI 29.26 kg/m    Patient spoke in full sentences, not short of breath. No audible wheezing or coughing. Speech pattern normal.  ASSESSMENT & PLAN:    1.  Permanent atrial fibrillation with CHA2DS2-VASc score of 3.  She continues  to decline anticoagulation and remains on low-dose aspirin along with Cardizem CD.  No reported palpitations.  Continue with observation.  We will continue to address question of anticoagulation going forward.  2.  Essential hypertension, systolic in the [id number] today.  No changes in current regimen.  She remains on Lotensin with follow-up by PCP.   Time:   Today, I have spent 5 minutes with the patient with telehealth technology discussing the above problems.     Medication Adjustments/Labs and Tests Ordered: Current medicines are reviewed at length with the patient today.  Concerns regarding medicines are outlined above.   Tests Ordered: No orders of the defined types were placed in this encounter.   Medication Changes: No orders of the defined types were placed in this encounter.   Follow Up:  In Person 6 months in the [place]  office.  Signed, Rozann Lesches, MD  10/31/2019 8:15 AM    [place]

## 2019-10-31 ENCOUNTER — Encounter: Payer: Self-pay | Admitting: Cardiology

## 2019-10-31 ENCOUNTER — Telehealth (INDEPENDENT_AMBULATORY_CARE_PROVIDER_SITE_OTHER): Payer: Medicare Other | Admitting: Cardiology

## 2019-10-31 VITALS — BP 121/94 | HR 85 | Temp 97.7°F | Ht 63.0 in | Wt 165.2 lb

## 2019-10-31 DIAGNOSIS — I4819 Other persistent atrial fibrillation: Secondary | ICD-10-CM | POA: Diagnosis not present

## 2019-10-31 DIAGNOSIS — I10 Essential (primary) hypertension: Secondary | ICD-10-CM | POA: Diagnosis not present

## 2019-10-31 NOTE — Patient Instructions (Addendum)

## 2020-03-05 ENCOUNTER — Telehealth: Payer: Self-pay | Admitting: Cardiology

## 2020-03-05 NOTE — Telephone Encounter (Signed)
Left message to return call 

## 2020-03-05 NOTE — Telephone Encounter (Signed)
Stated that she went to her pcp this morning - now ready to begin the Eliquis.

## 2020-03-05 NOTE — Telephone Encounter (Signed)
I presume that means it was not started by the PCP?  She last had a telehealth encounter in January.  If we are to start this medication I would suggest that she gets a CBC and a BMET and we set up an office visit with Lattie Haw in the anticoagulation clinic for initiation of Eliquis.

## 2020-03-05 NOTE — Telephone Encounter (Signed)
Patient called asking if she is suppose to start new medication

## 2020-03-05 NOTE — Telephone Encounter (Signed)
Patient notified.  She will let her pcp initiate.  6 mo f/u scheduled for July with Jonni Sanger in [place] office.

## 2020-03-05 NOTE — Telephone Encounter (Signed)
Patient returned call

## 2020-03-20 ENCOUNTER — Other Ambulatory Visit: Payer: Self-pay

## 2020-03-20 ENCOUNTER — Emergency Department (HOSPITAL_COMMUNITY): Payer: Medicare Other

## 2020-03-20 ENCOUNTER — Emergency Department (HOSPITAL_COMMUNITY)
Admission: EM | Admit: 2020-03-20 | Discharge: 2020-03-20 | Disposition: A | Discharge status: Home / Self Care | Payer: Medicare Other | Attending: Emergency Medicine | Admitting: Emergency Medicine

## 2020-03-20 ENCOUNTER — Encounter (HOSPITAL_COMMUNITY): Payer: Self-pay | Admitting: Emergency Medicine

## 2020-03-20 DIAGNOSIS — Z87891 Personal history of nicotine dependence: Secondary | ICD-10-CM | POA: Insufficient documentation

## 2020-03-20 DIAGNOSIS — S52501A Unspecified fracture of the lower end of right radius, initial encounter for closed fracture: Secondary | ICD-10-CM | POA: Diagnosis not present

## 2020-03-20 DIAGNOSIS — Z79899 Other long term (current) drug therapy: Secondary | ICD-10-CM | POA: Diagnosis not present

## 2020-03-20 DIAGNOSIS — Y929 Unspecified place or not applicable: Secondary | ICD-10-CM | POA: Insufficient documentation

## 2020-03-20 DIAGNOSIS — W010XXA Fall on same level from slipping, tripping and stumbling without subsequent striking against object, initial encounter: Secondary | ICD-10-CM | POA: Insufficient documentation

## 2020-03-20 DIAGNOSIS — S6991XA Unspecified injury of right wrist, hand and finger(s), initial encounter: Secondary | ICD-10-CM | POA: Diagnosis present

## 2020-03-20 DIAGNOSIS — Y9301 Activity, walking, marching and hiking: Secondary | ICD-10-CM | POA: Diagnosis not present

## 2020-03-20 DIAGNOSIS — Z7982 Long term (current) use of aspirin: Secondary | ICD-10-CM | POA: Insufficient documentation

## 2020-03-20 DIAGNOSIS — Y999 Unspecified external cause status: Secondary | ICD-10-CM | POA: Diagnosis not present

## 2020-03-20 DIAGNOSIS — S52601A Unspecified fracture of lower end of right ulna, initial encounter for closed fracture: Secondary | ICD-10-CM | POA: Diagnosis not present

## 2020-03-20 DIAGNOSIS — I10 Essential (primary) hypertension: Secondary | ICD-10-CM | POA: Diagnosis not present

## 2020-03-20 MED ORDER — HYDROCODONE-ACETAMINOPHEN 5-325 MG PO TABS: 1.0000 | ORAL_TABLET | ORAL | 0 refills | Status: DC | PRN
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
## 2020-03-20 NOTE — Discharge Instructions (Addendum)
Follow up with orthopedics, call today to schedule an appointment for Thursday or Friday. Keep your splint on, keep it clean and dry You may remove the sling as needed. You may elevate the arm above the level of your heart and apply an ice pack to the splint for 20 minutes at a time 3 times daily. You may take Norco as needed as prescribed for pain.  This can cause constipation, take Colace as needed.

## 2020-03-20 NOTE — ED Provider Notes (Signed)
[facility] EMERGENCY DEPARTMENT Provider Note   CSN: [id number] Arrival date & time: 03/20/20  1287     History Chief Complaint  Patient presents with  . Wrist Injury    Right    Tara Floyd is a 74 y.o. female.  74 year old female with injury to the right wrist which occurred yesterday. Patient is right hand dominant, was walking to the car yesterday when she tripped and fell landing on outstretched right hand.  Patient denies numbness or weakness in her fingers, did not hit her head or lose consciousness, is not anticoagulated.  No other complaints or concerns.        Past Medical History:  Diagnosis Date  . Allergic rhinitis   . Depression   . DJD (degenerative joint disease)   . Essential hypertension   . PAF (paroxysmal atrial fibrillation) [facility])     Patient Active Problem List   Diagnosis Date Noted  . Special screening for malignant neoplasms, colon 06/12/2019  . Postoperative ileus ([place]) 05/20/2018  . Acute hypoxemic respiratory failure ([place]) 05/20/2018  . Acute cholecystitis 05/17/2018  . Persistent atrial fibrillation ([place]) 05/17/2018  . PAF (paroxysmal atrial fibrillation) ([place]) 03/13/2014  . Essential hypertension, benign 02/08/2014    Past Surgical History:  Procedure Laterality Date  . CHOLECYSTECTOMY N/A 05/18/2018   Procedure: LAPAROSCOPIC CHOLECYSTECTOMY;  Surgeon: Virl Cagey, MD;  Location: [facility] ORS;  Service: General;  Laterality: N/A;  . COLONOSCOPY N/A 09/27/2019   Procedure: COLONOSCOPY;  Surgeon: Rogene Houston, MD;  Location: [facility] ENDO SUITE;  Service: Endoscopy;  Laterality: N/A;  730  . DILATION AND CURETTAGE OF UTERUS    . TUBAL LIGATION    . VAGINAL HYSTERECTOMY  08/22/2019     OB History   No obstetric history on file.     Family History  Problem Relation Age of Onset  . Parkinson's disease Father   . Brain cancer Mother     Social History   Tobacco Use  . Smoking status: Former Smoker    Packs/day: 0.50    Years:  27.00    Pack years: 13.50    Types: Cigarettes    Start date: 10/21/1961    Quit date: 09/30/1989    Years since quitting: 30.4  . Smokeless tobacco: Never Used  Substance Use Topics  . Alcohol use: No    Alcohol/week: 0.0 standard drinks  . Drug use: No    Home Medications Prior to Admission medications   Medication Sig Start Date End Date Taking? Authorizing Provider  ALPRAZolam Duanne Moron) 0.5 MG tablet Take 0.5 mg by mouth at bedtime as needed for anxiety.     [provider]  aspirin 81 MG tablet Take 81 mg by mouth daily.    [provider]  benazepril (LOTENSIN) 10 MG tablet Take 10 mg by mouth daily.    [provider]  diclofenac sodium (VOLTAREN) 1 % GEL Apply 1 application topically 2 (two) times daily as needed (pain).  12/11/16   [provider]  diltiazem (CARDIZEM CD) 120 MG 24 hr capsule Take 120 mg by mouth daily. 09/16/19   [provider]  docusate sodium (COLACE) 100 MG capsule Take 1 capsule (100 mg total) by mouth 2 (two) times daily. Patient taking differently: Take 100 mg by mouth at bedtime.  05/21/18   Isaac Bliss, Rayford Halsted, MD  fluticasone (FLONASE) 50 MCG/ACT nasal spray Place 2 sprays into both nostrils daily as needed for allergies.  [provider]  Glycerin-Hypromellose-PEG 400 (DRY EYE RELIEF DROPS) 0.2-0.2-1 % SOLN Place 1 drop into both eyes daily as needed (Dry eye).    [provider]  HYDROcodone-acetaminophen (NORCO/VICODIN) 5-325 MG tablet Take 1 tablet by mouth every 4 (four) hours as needed. 03/20/20   Tacy Learn, PA-C  ibuprofen (ADVIL,MOTRIN) 200 MG tablet Take 800 mg by mouth every 6 (six) hours as needed for headache, mild pain or moderate pain.     [provider]  montelukast (SINGULAIR) 10 MG tablet Take 10 mg by mouth daily as needed (allergies). Alternate with Loratadine 07/01/19   [provider]    Allergies    Codeine phosphate  Review of Systems     Review of Systems  Constitutional: Negative for fever.  Musculoskeletal: Positive for arthralgias and joint swelling. Negative for back pain, gait problem, neck pain and neck stiffness.  Skin: Positive for wound.  Neurological: Negative for weakness and numbness.  Hematological: Does not bruise/bleed easily.    Physical Exam Updated Vital Signs BP (!) 143/92 (BP Location: Left Arm)   Pulse 92   Temp (!) 97.5 F (36.4 C) (Oral)   Resp 18   Ht 5\' 2"  (1.575 m)   Wt 76.7 kg   SpO2 97%   BMI 30.91 kg/m   Physical Exam Vitals and nursing note reviewed.  Constitutional:      General: She is not in acute distress.    Appearance: She is well-developed. She is not diaphoretic.  HENT:     Head: Normocephalic and atraumatic.  Cardiovascular:     Pulses: Normal pulses.  Pulmonary:     Effort: Pulmonary effort is normal.  Musculoskeletal:        General: Swelling, tenderness and deformity present.     Comments: Minor abrasion to right wrist, swelling, tenderness to distal radius and ulna. Sensation intact, able to flex/extend all fingers, strong radial pulse present.   Skin:    General: Skin is warm and dry.     Capillary Refill: Capillary refill takes less than 2 seconds.     Findings: No erythema or rash.  Neurological:     Mental Status: She is alert and oriented to person, place, and time.  Psychiatric:        Behavior: Behavior normal.     ED Results / Procedures / Treatments   Labs (all labs ordered are listed, but only abnormal results are displayed) Labs Reviewed - No data to display  EKG None  Radiology DG Wrist Complete Right  Result Date: 03/20/2020 CLINICAL DATA:  Right wrist pain and swelling after fall EXAM: RIGHT WRIST - COMPLETE 3+ VIEW COMPARISON:  None. FINDINGS: Acute impacted fracture of the distal radial metaphysis with 2 mm of dorsal displacement and slight dorsal angulation. Intra-articular extension to the radiocarpal joint is suspected. Minimally  displaced fracture at the base of the ulnar styloid. Carpal bones appear intact without fracture or malalignment. Soft tissue swelling is evident at the wrist. IMPRESSION: 1. Acute impacted fracture of the distal radial metaphysis with 2 mm of dorsal displacement and slight dorsal angulation. 2. Minimally displaced ulnar styloid fracture. Electronically Signed   By: Davina Poke D.O.   On: 03/20/2020 10:26    Procedures .Splint Application  Date/Time: 03/20/2020 11:08 AM Performed by: Tacy Learn, PA-C Authorized by: Tacy Learn, PA-C   Consent:    Consent obtained:  Verbal   Consent given by:  Patient   Risks discussed:  Discoloration, numbness,  pain and swelling   Alternatives discussed:  No treatment Pre-procedure details:    Sensation:  Normal   Skin color:  Normal Procedure details:    Laterality:  Right   Location:  Arm   Arm:  R lower arm   Cast type: sugartong.   Splint type:  Sugar tong   Supplies:  Cotton padding, Ortho-Glass, elastic bandage and sling Post-procedure details:    Pain:  Improved   Sensation:  Normal   Skin color:  Normal   Patient tolerance of procedure:  Tolerated well, no immediate complications   (including critical care time)  Medications Ordered in ED Medications - No data to display  ED Course  I have reviewed the triage vital signs and the nursing notes.  Pertinent labs & imaging results that were available during my care of the patient were reviewed by me and considered in my medical decision making (see chart for details).  Clinical Course as of Mar 20 1112  Wed Mar 20, 3668  [id number] 74 year old female with trip and fall yesterday landing on outstretched dominant right hand resulting in distal radius and ulna fractures.  Sensation intact, skin intact, strong radial pulse present. Patient was placed in a sugar tong splint with a sling. Case discussed with Dr. Apolonio Schneiders on-call with hand Ortho who can follow-up with patient in the  office on Thursday or Friday.   [LM]    Clinical Course User Index [LM] Roque Lias   MDM Rules/Calculators/A&P                      Final Clinical Impression(s) / ED Diagnoses Final diagnoses:  Closed fracture of distal ends of right radius and ulna, initial encounter    Rx / DC Orders ED Discharge Orders         Ordered    HYDROcodone-acetaminophen (NORCO/VICODIN) 5-325 MG tablet  Every 4 hours PRN     03/20/20 1111           Tacy Learn, PA-C 03/20/20 1113    Noemi Chapel, MD 03/21/20 [phone]

## 2020-03-22 ENCOUNTER — Telehealth: Payer: Self-pay | Admitting: *Deleted

## 2020-03-22 NOTE — Telephone Encounter (Signed)
Forwarded to requesting party

## 2020-03-22 NOTE — Telephone Encounter (Signed)
° °  [facility] Pre-operative Risk Assessment    HEARTCARE STAFF: - Please ensure there is not already an duplicate clearance open for this procedure. - Under Visit Info/Reason for Call, type in Other and utilize the format Clearance MM/DD/YY or Clearance TBD. Do not use dashes or single digits. - If request is for dental extraction, please clarify the # of teeth to be extracted.  Request for surgical clearance:  1. What type of surgery is being performed? Right distal radius ORIF  2. When is this surgery scheduled? 03/25/2020  3. What type of clearance is required (medical clearance vs. Pharmacy clearance to hold med vs. Both)? Medical & Pharmacy  4. Are there any medications that need to be held prior to surgery and how long? Eliquis  5. Practice name and name of physician performing surgery? [facility] Dr. Iran Planas  6. What is the office phone number? [phone]   7.   What is the office fax number? [phone]  8.   Anesthesia type (None, local, MAC, general) ? Regional block with IV sedation   Marlou Sa 03/22/2020, 12:17 PM  _________________________________________________________________   (provider comments below)

## 2020-03-22 NOTE — Telephone Encounter (Signed)
Primary Cardiologist:Samuel Domenic Polite, MD  Chart reviewed as part of pre-operative protocol coverage. Because of Osha B Testerman's   Patient's Eliquis is managed by her PCP.  She will need to receive clearance from her PCP office to hold the medication.  Pre-op covering staff:  - Please forward PCP office information regarding patient's right distal radius ORIF.    If applicable, this message will also be routed to pharmacy pool and/or primary cardiologist for input on holding anticoagulant/antiplatelet agent as requested below so that this information is available at time of patient's appointment.   Deberah Pelton, NP  03/22/2020, 1:37 PM

## 2020-03-22 NOTE — Telephone Encounter (Addendum)
Patient with diagnosis of Afib newly started on Eliquis for anticoagulation.    Procedure: Right distal radius ORIF Date of procedure: 03/25/20  CHADS2-VASc score of 3 (HTN, AGE, female)  CrCl 71 mL/min Platelet count 238k  Per office protocol, patient can hold Eliquis for 2-3 days prior to procedure as needed by surgeon.   Pt does not currently have Eliquis on her medication list but looks like primary care may have sent in a prescription for Eliquis 2.5mg  BID on 03/06/20. However, based on age <80, Scr <1.5, and TBW >60kg the patient should be taking Eliquis 5mg  BID.  Please clarify the dose with the patient and see if she is willing to take an increased dose to achieve therapeutic levels.   Thanks,   Kennon Holter, PharmD PGY1 Ambulatory Care Pharmacy Resident

## 2020-03-26 NOTE — Telephone Encounter (Signed)
Primary Cardiologist:Samuel Domenic Polite, MD  Chart reviewed as part of pre-operative protocol coverage. Because of Tara Floyd's past medical history and time since last visit, he/she will require a follow-up visit in order to better assess preoperative cardiovascular risk.  Pre-op covering staff: - Please schedule appointment and call patient to inform them. - Please contact requesting surgeon's office via preferred method (i.e, phone, fax) to inform them of need for appointment prior to surgery.  If applicable, this message will also be routed to pharmacy pool and/or primary cardiologist for input on holding anticoagulant/antiplatelet agent as requested below so that this information is available at time of patient's appointment.   Deberah Pelton, NP  03/26/2020, 10:11 AM

## 2020-03-26 NOTE — Telephone Encounter (Signed)
Called pt and she stopped her Eliquis on her own and they went ahead with her surgery yesterday.

## 2020-04-15 IMAGING — US US ABDOMEN LIMITED
1 series · 13 of 25 positions shown · non-contrast
Comparison: CT abdomen and pelvis May 16, 2018

CLINICAL DATA: Upper abdominal pain with vomiting

EXAM:
ULTRASOUND ABDOMEN LIMITED RIGHT UPPER QUADRANT

[Series 1: us abdomen limited · 0.27mm/px · 13 of 53 slices shown]
[im 1/53]
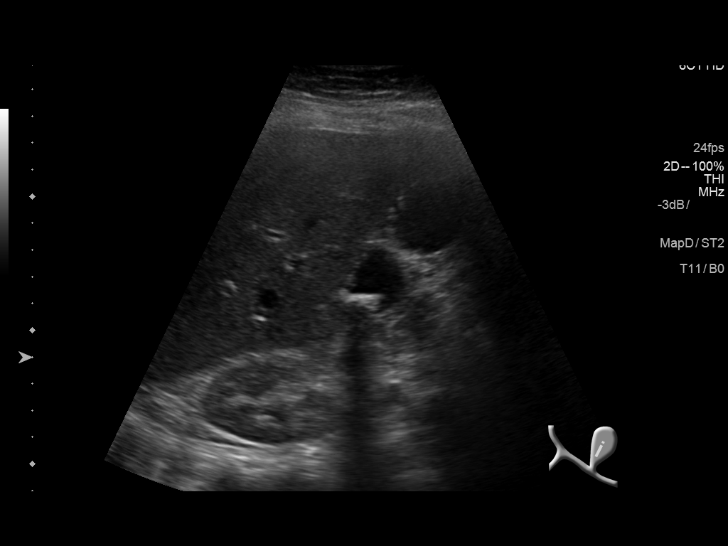
[im 5/53]
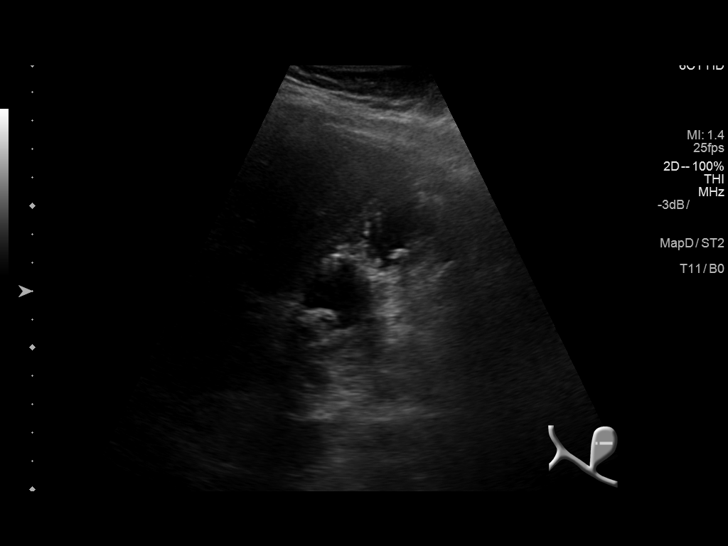
[im 9/53]
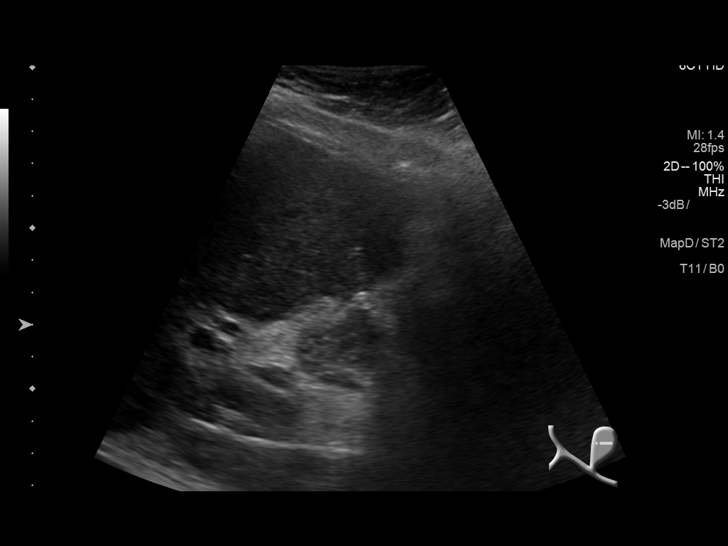
[im 14/53]
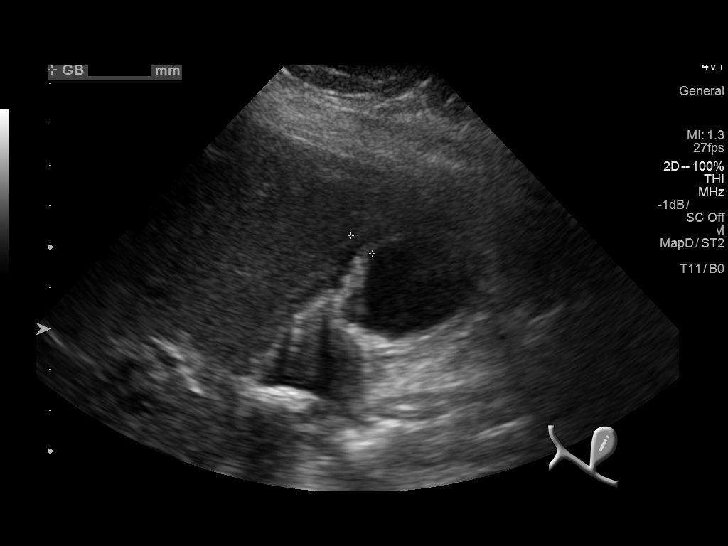
[im 18/53]
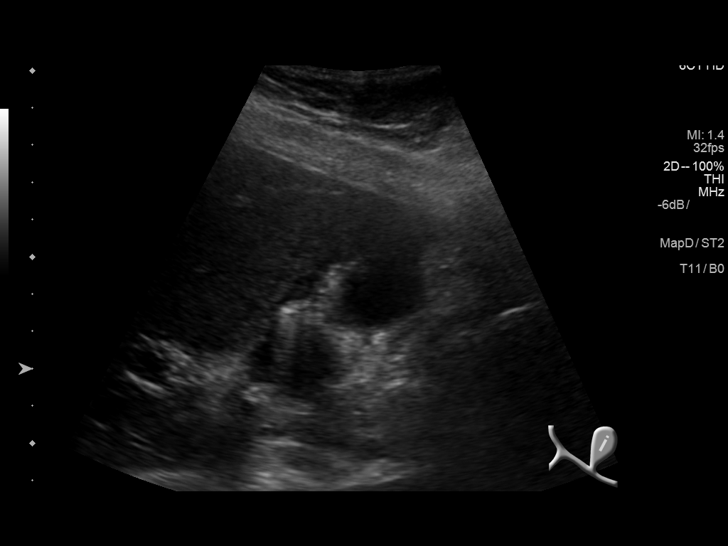
[im 22/53]
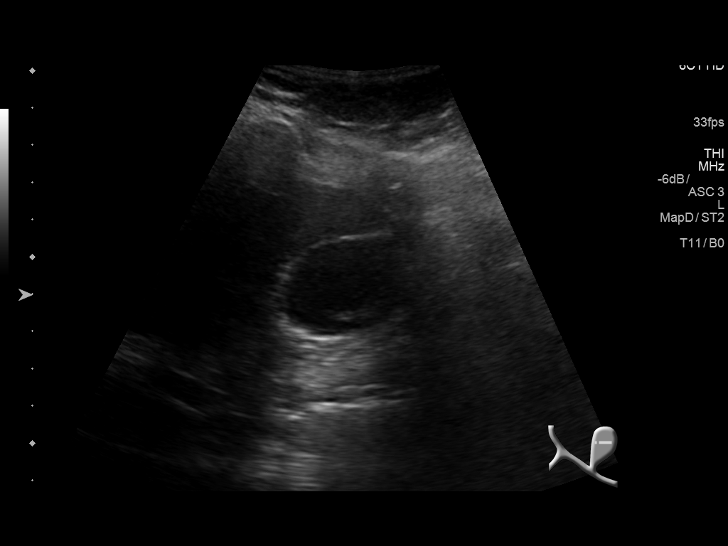
[im 27/53]
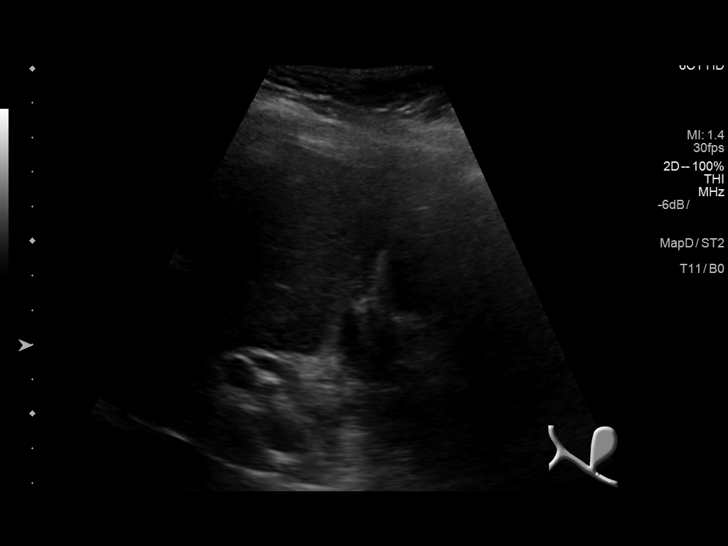
[im 31/53]
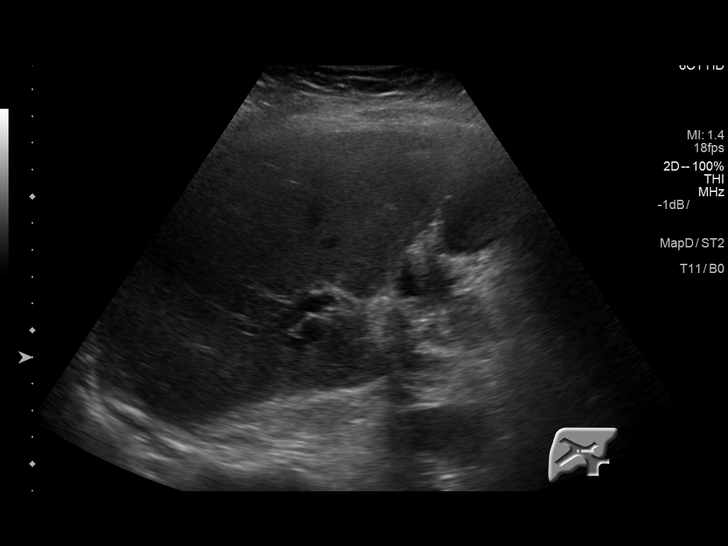
[im 35/53]
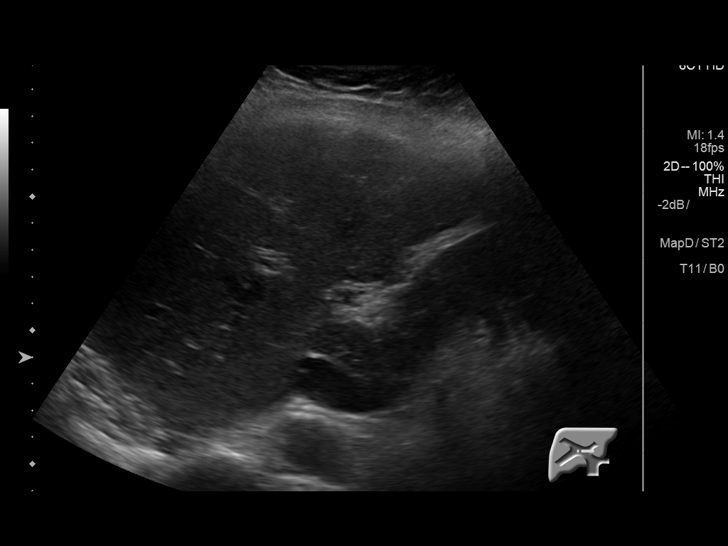
[im 40/53]
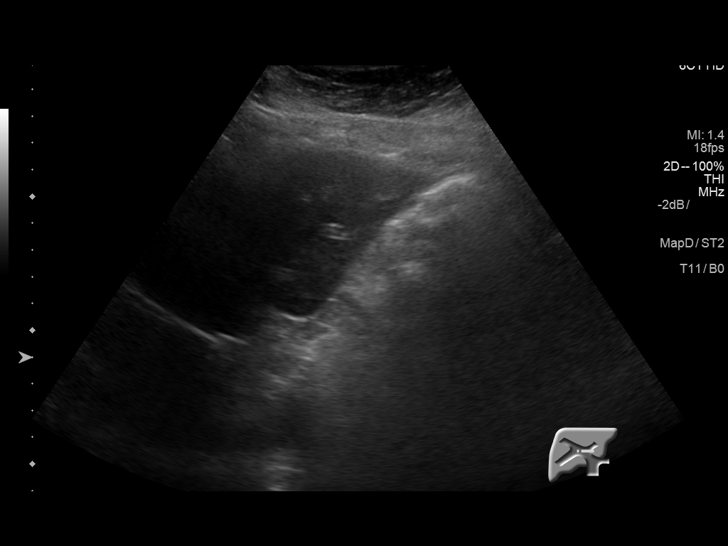
[im 44/53]
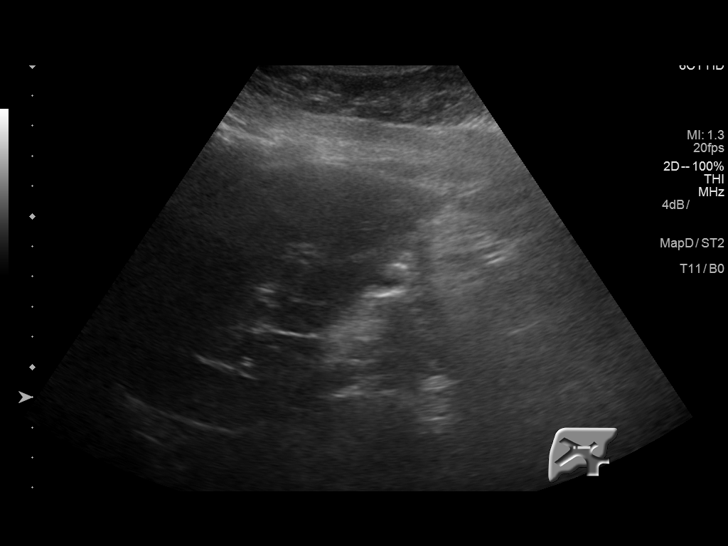
[im 48/53]
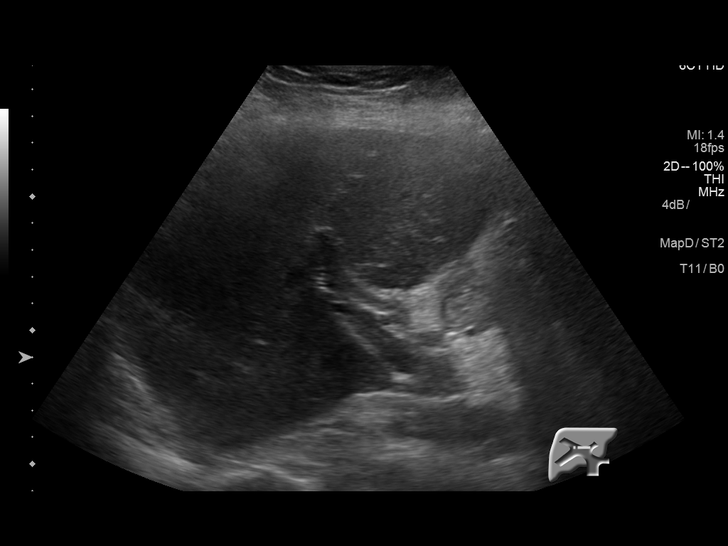
[im 53/53]
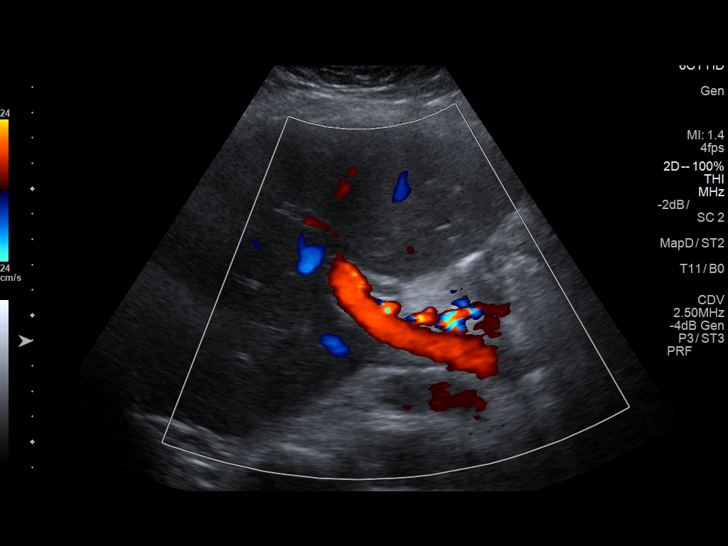

[13 of 25 positions shown; findings below may reference images not displayed]

FINDINGS: Gallbladder:

Within the gallbladder, there are echogenic foci which move and
shadow consistent with cholelithiasis. Largest gallstone measures
1.5 cm in length. There is evidence of thickening of the gallbladder
wall with apparent gallbladder wall edema. There is no appreciable
pericholecystic fluid. There is comet tail type artifact along the
anterior gallbladder wall suggesting that there may be superimposed
gallbladder wall inflammation such as adenomyomatosis or
cholesterolosis. No sonographic Murphy sign noted by sonographer.

Common bile duct:

Diameter: 4 mm. No intrahepatic or extrahepatic biliary duct
dilatation.

Liver:

No focal lesion identified. Within normal limits in parenchymal
echogenicity. Portal vein is patent on color Doppler imaging with
normal direction of blood flow towards the liver.
IMPRESSION: Cholelithiasis with gallbladder wall thickening and apparent
gallbladder wall edema. Suspect a degree of cute cholecystitis.
There may well be superimposed adenomyomatosis or cholesterolosis in
the gallbladder wall given comet tail type artifact anteriorly.

Study otherwise unremarkable.

## 2020-04-26 ENCOUNTER — Encounter (HOSPITAL_COMMUNITY): Payer: Self-pay | Admitting: Specialist

## 2020-04-26 ENCOUNTER — Ambulatory Visit (HOSPITAL_COMMUNITY): Payer: Medicare Other | Attending: Orthopedic Surgery | Admitting: Specialist

## 2020-04-26 ENCOUNTER — Other Ambulatory Visit: Payer: Self-pay

## 2020-04-26 DIAGNOSIS — M25631 Stiffness of right wrist, not elsewhere classified: Secondary | ICD-10-CM | POA: Insufficient documentation

## 2020-04-26 DIAGNOSIS — M25531 Pain in right wrist: Secondary | ICD-10-CM | POA: Diagnosis present

## 2020-04-26 DIAGNOSIS — R278 Other lack of coordination: Secondary | ICD-10-CM | POA: Insufficient documentation

## 2020-04-26 DIAGNOSIS — R29898 Other symptoms and signs involving the musculoskeletal system: Secondary | ICD-10-CM

## 2020-04-26 NOTE — Therapy (Signed)
[facility] [place], [place], [address] Phone: [phone]   Fax:  [phone]  Occupational Therapy Evaluation  Patient Details  Name: Tara Floyd MRN: [id number] Date of Birth: Nov 22, 1945 Referring Provider (OT): Dr. Caralyn Guile   Encounter Date: 04/26/2020   OT End of Session - 04/26/20 1253    Visit Number 1    Number of Visits 16    Date for OT Re-Evaluation 06/21/20   mini reassess on 8/13   Authorization Type [employer] Medicare    Progress Note Due on Visit 10    OT Start Time 1030    OT Stop Time 1130    OT Time Calculation (min) 60 min    Activity Tolerance Patient tolerated treatment well    Behavior During Therapy [facility] for tasks assessed/performed           Past Medical History:  Diagnosis Date  . Allergic rhinitis   . Depression   . DJD (degenerative joint disease)   . Essential hypertension   . PAF (paroxysmal atrial fibrillation) ([place])     Past Surgical History:  Procedure Laterality Date  . CHOLECYSTECTOMY N/A 05/18/2018   Procedure: LAPAROSCOPIC CHOLECYSTECTOMY;  Surgeon: Virl Cagey, MD;  Location: [facility] OR[facility];  Service: General;  Laterality: N/A;  . COLONOSCOPY N/A 09/27/2019   Procedure: COLONOSCOPY;  Surgeon: Rogene Houston, MD;  Location: [facility];  Service: Endoscopy;  Laterality: N/A;  730  . DILATION AND CURETTAGE OF UTERUS    . TUBAL LIGATION    . VAGINAL HYSTERECTOMY  08/22/2019    There were no vitals filed for this visit.   Subjective Assessment - 04/26/20 1248    Subjective  S:  I like to stay active.    Pertinent History Tara Floyd reports that she tripped on a cement crack at a local restaurant on 03/20/20, falling and landing on her right hand.  She presented to the ED and was diagnosed with a right Colle's fracture.  She consulted with Dr. Caralyn Guile, and had surgery to repair the fracture on 03/25/20.  Her cast was removed last week.  She has been referred to occupational therapy for  evaluation and treatment, following the [place].    Special Tests FOTO 54% independent    Patient Stated Goals I want to get back to normal    Currently in Pain? Yes    Pain Score 2     Pain Location Wrist    Pain Orientation Right    Pain Descriptors / Indicators Aching    Pain Type Acute pain    Pain Onset More than a month ago    Pain Frequency Intermittent    Aggravating Factors  pressure or movement    Pain Relieving Factors rest    Effect of Pain on Daily Activities minimal             [facility] OT Assessment - 04/26/20 0001      Assessment   Medical Diagnosis S/P Right Colles Fracture    Referring Provider (OT) Dr. Caralyn Guile    Onset Date/Surgical Date 03/25/20   surgery date   Hand Dominance Right    Next MD Visit unknown    Prior Therapy no      Precautions   Precautions Other (comment)   wrist   Precaution Comments follow IHP - A/ROM through 05/20/20, 8/10 begin P/ROM and strengthening, progress as tolerated     Required Braces or Orthoses Other Brace/Splint  Other Brace/Splint wrist cock up when out and about      Restrictions   Weight Bearing Restrictions No      Balance Screen   Has the patient fallen in the past 6 months Yes    How many times? 1    Has the patient had a decrease in activity level because of a fear of falling?  No    Is the patient reluctant to leave their home because of a fear of falling?  No      Home  Environment   Family/patient expects to be discharged to: Private residence    Living Arrangements Spouse/significant other    Available Help at Discharge Family      Prior Function   Level of [place]   driving   Vocation Retired    Leisure enjoys Control and instrumentation engineer, gardening, rec center group exercise, and walking       ADL   ADL comments Patient is right hand dominant, unable to use dominant right hand to push up out of a chair, open containers, lift heavy items       Written Expression   Dominant Hand Right     Handwriting 100% legible      Vision - History   Baseline Vision No visual deficits      Cognition   Overall Cognitive Status Within Functional Limits for tasks assessed      Observation/Other Assessments   Focus on Therapeutic Outcomes (FOTO)  54% independent      Sensation   Light Touch Appears Intact      Coordination   Gross Motor Movements are Fluid and Coordinated Yes    9 Hole Peg Test Right;Left    Right 9 Hole Peg Test 21.70"    Left 9 Hole Peg Test 20.38"      Perception   Perception Within Functional Limits      Praxis   Praxis Intact      Edema   Edema wrist crease:  right 18.0 cm left 16.0 cm MCPJ right 19.8 cm left 18      ROM / Strength   AROM / PROM / Strength AROM;Strength      Palpation   Palpation comment mod fascial restrictions and scar restrictions in forearm and wrist region.  healed surgical incision is 6.5 cm on volar forearm       AROM   AROM Assessment Site Forearm;Wrist    Right/Left Forearm Right    Right Forearm Pronation 32 Degrees    Right Forearm Supination 20 Degrees    Right/Left Wrist Right    Right Wrist Extension 20 Degrees    Right Wrist Flexion 32 Degrees    Right Wrist Radial Deviation 20 Degrees    Right Wrist Ulnar Deviation 20 Degrees      Strength   Strength Assessment Site Forearm;Wrist    Right/Left Forearm Right    Right Forearm Pronation 4-/5    Right Forearm Supination 4-/5    Right/Left Wrist Right    Right Wrist Flexion 4-/5    Right Wrist Extension 4-/5    Right Wrist Radial Deviation 4-/5    Right Wrist Ulnar Deviation 4-/5      Right Hand AROM   R Thumb MCP 0-60 50 Degrees    R Thumb IP 0-80 54 Degrees    R Index  MCP 0-90 64 Degrees    R Index PIP 0-100 90 Degrees    R Index DIP 0-70 40 Degrees  R Long  MCP 0-90 70 Degrees    R Long PIP 0-100 90 Degrees    R Long DIP 0-70 60 Degrees    R Ring  MCP 0-90 66 Degrees    R Ring PIP 0-100 91 Degrees    R Ring DIP 0-70 50 Degrees    R Little  MCP  0-90 80 Degrees    R Little PIP 0-100 90 Degrees    R Little DIP 0-70 60 Degrees      Hand Function   Right Hand Grip (lbs) 17    Right Hand Lateral Pinch 10 lbs    Right Hand 3 Point Pinch 7 lbs    Left Hand Grip (lbs) 75    Left Hand Lateral Pinch 16 lbs    Left 3 point pinch 15 lbs                    OT Treatments/Exercises (OP) - 04/26/20 0001      Manual Therapy   Manual Therapy Myofascial release    Manual therapy comments manual therapy completed seperately from all other interventions this date    Myofascial Release myofacial release and manual stretching to right flexor and extensor forearm, wrist, and hand to improve mobility                 OT Education - 04/26/20 1251    Education Details educated on HEP for wrist, forearm A/ROM, tendon glides, fine motor coordination training.    Person(s) Educated Patient    Methods Explanation    Comprehension Verbalized understanding            OT Short Term Goals - 04/26/20 1300      OT SHORT TERM GOAL #1   Title Patient will be educated and independent with HEP for improved RUE mobility, strength, and coordination.    Time 4    Period Weeks    Status New    Target Date 05/24/20      OT SHORT TERM GOAL #2   Title Patient will improve RUE A/ROM to [facility] in order to resume all desired B/IADLs and leisure activites.    Time 4    Period Weeks    Status New      OT SHORT TERM GOAL #3   Title Patient will improve right grip strength by 10 pounds and pinch strength by 2 pounds for improved ability to open water bottles without difficulty.    Time 4    Period Weeks    Status New      OT SHORT TERM GOAL #4   Title Patient will decrease edema by 1.0 cm in right wrist and hand for greater mobility and comfort when using right arm as dominant with daily tasks.    Time 4    Period Weeks    Status New             OT Long Term Goals - 04/26/20 1302      OT LONG TERM GOAL #1   Title Patient will  return to PLOF using right hand as dominant with all BIADLS and leisure activities.    Time 8    Period Weeks    Status New    Target Date 06/21/20      OT LONG TERM GOAL #2   Title Patient will improve RUE P/ROM to WNL in order to push self out of bathtub, garden, and comb her hair without difficulty.    Time  8    Period Weeks    Status New      OT LONG TERM GOAL #3   Title Patient will improve RUE strength to 5/5 in order to pick up gardening tools and supplies and resume weekly workouts at the rec center.    Time 8    Period Weeks    Status New      OT LONG TERM GOAL #4   Title Patient will improve RUE grip and pinch strength to 80% of LUE or better for improved ability to open doors, jars, containers.    Time 8    Period Weeks    Status New      OT LONG TERM GOAL #5   Title Patient will improve RUE coordination to WNL as evidence by completing nine hole peg test in 20 seconds or less.    Time 8    Period Weeks    Status New      Long Term Additional Goals   Additional Long Term Goals Yes      OT LONG TERM GOAL #6   Title Patient will decrease edema by 1.5 cm or more in right wrist and hand for greater mobility and less pain.    Time 8    Period Weeks    Status New      OT LONG TERM GOAL #7   Title Patient will decrease pain to 1/10 or better in right wrist region when completing gardening tasks.                 Plan - 04/26/20 1255    Clinical Impression Statement A:  Patient is a 73 year old female with past medical history significant for gall bladder removal and hysterectomy.  Patient fell on 03/20/20 and suffered a right Colles fracture, which was surgically repaired on 03/25/20.  She is right hand dominant and is unable to use her right hand as dominant with daily tasks that involve heavy lifting, squeezing, and turning her foreram.  She in not able to push herself up from the bathtub or a chair with her right hand.    OT Occupational Profile and History  Detailed Assessment- Review of Records and additional review of physical, cognitive, psychosocial history related to current functional performance    Occupational performance deficits (Please refer to evaluation for details): ADL's;IADL's;Leisure    Body Structure / Function / Physical Skills ADL;Strength;Pain;Edema;UE functional use;IADL;ROM;Scar mobility;FMC;Skin integrity;Muscle spasms    Rehab Potential Good    Clinical Decision Making Several treatment options, min-mod task modification necessary    Comorbidities Affecting Occupational Performance: None    Modification or Assistance to Complete Evaluation  Min-Moderate modification of tasks or assist with assess necessary to complete eval    OT Frequency 2x / week    OT Duration 8 weeks    OT Treatment/Interventions Self-care/ADL training;Electrical Stimulation;Iontophoresis;Therapeutic exercise;Patient/family education;Splinting;Neuromuscular education;Paraffin;Moist Heat;Energy conservation;Scar mobilization;Therapeutic activities;Passive range of motion;Manual Therapy;DME and/or AE instruction;Ultrasound;Cryotherapy;Contrast Bath    Plan P:  Skilled OT intervention to decrease pain, fascial and scar restrictions and edema while improving A/PROM, strength, fine motor coordination in dominant right arm in order to return to prior level of independence.  Next session:  follow up on HEP, manual therapy, a/rom, coordiantion training.    OT Home Exercise Plan eval:  a/rom forearm, wrist, hand and fmc    Consulted and Agree with Plan of Care Patient           Patient will benefit from skilled therapeutic  intervention in order to improve the following deficits and impairments:   Body Structure / Function / Physical Skills: ADL, Strength, Pain, Edema, UE functional use, IADL, ROM, Scar mobility, FMC, Skin integrity, Muscle spasms       Visit Diagnosis: Pain in right wrist - Plan: Ot plan of care cert/re-cert  Stiffness of right wrist, not  elsewhere classified - Plan: Ot plan of care cert/re-cert  Other symptoms and signs involving the musculoskeletal system - Plan: Ot plan of care cert/re-cert  Other lack of coordination - Plan: Ot plan of care cert/re-cert    Problem List Patient Active Problem List   Diagnosis Date Noted  . Special screening for malignant neoplasms, colon 06/12/2019  . Postoperative ileus ([place]) 05/20/2018  . Acute hypoxemic respiratory failure ([place]) 05/20/2018  . Acute cholecystitis 05/17/2018  . Persistent atrial fibrillation ([place]) 05/17/2018  . PAF (paroxysmal atrial fibrillation) ([place]) 03/13/2014  . Essential hypertension, benign 02/08/2014    Vangie Bicker, [place], OTR/L [phone]  04/26/2020, 1:17 PM  [place] [address] [place], [place], [address] Phone: [phone]   Fax:  [phone]  Name: Tara Floyd MRN: [id number] Date of Birth: [birth year]

## 2020-04-26 NOTE — Patient Instructions (Signed)
AROM: Wrist Flexion / Extension  Actively bend right wrist forward then back as far as possible. Repeat ____ times per set. Do ____ sets per session. Do ____ sessions per day.  Copyright  VHI. All rights reserved.  AROM: Wrist Radial / Ulnar Deviation   Gently bend left wrist from side to side as far as possible. Repeat ____ times per set. Do ____ sets per session. Do ____ sessions per day.  Copyright  VHI. All rights reserved.  AROM: Forearm Pronation / Supination   With right arm in handshake position, slowly rotate palm down until stretch is felt. Relax. Then rotate palm up until stretch is felt. Repeat ____ times per set. Do ____ sets per session. Do ____ sessions per day.  Copyright  VHI. All rights reserved.    Fine Motor Coordination Exercises  Perform the following exercises 2 times a day, as recommended by your occupational therapist.   Close all fingers and thumb into a tight fist and then open wide. (10 times)  Palm of hand on table, spread fingers apart, then together. (10 times)  Lift fingers and thumb off table one at a time. Increase speed as able. (10 times)  Touch thumb to each fingertip. Increase speed as able. (10 times)  Thumb circles. (10 times)  Pick up 5 small objects (coins, marbles, paperclips, beads, etc.) one at a time and hold them in hand, then place them one by one onto the table.  Pick up small objects and place them into a cup or container.  Place clothespins onto the edge of a cup, can, or container.  Play card games. Practice shuffling and dealing cards. Flip cards over onto table one by one.  Practice screwing and unscrewing nuts/bolts.  Stack approximately [employer] (checkers, coins, etc.) onto table.  Use scissors to cut paper.  Practice writing skills, dot to dot, puzzles, etc.  Paperwork: practice folding, stuffing envelopes, addressing envelopes, stapling, using paperclips and tape.  Practice lacing and tying a  shoelace.

## 2020-04-28 NOTE — Progress Notes (Signed)
Cardiology Office Note  Date: 04/29/2020   ID: Tara Floyd, DOB Aug 07, 1946, MRN [id number]  PCP:  Adaline Sill, NP  Cardiologist:  Rozann Lesches, MD Electrophysiologist:  None   Chief Complaint: F/U persistent atrial fibrillation  History of Present Illness: Tara Floyd is a 74 y.o. female with a history of permanent atrial fibrillation and essential hypertension.  Last encounter with Dr. Domenic Polite on 10/30/2019 via telemedicine visit.,  Cardiac perspective she did not report any sense of palpitations or chest pain.  Her CHA2DS2-VASc was 3.  She had declined anticoagulation.  She continued on low-dose aspirin and Cardizem CD.  She had the Covid virus in early January with mild symptoms.  Dr. Domenic Polite noted that he would continue to address the question of anticoagulation going forward.  Blood pressure was stable on current medications.  She was continuing  Lotensin.   Recent hysterectomy November 2020.  Recent screening colonoscopy December 2020.  Diagnosed with Covid virus along with her husband in early January 2021.  Recent right distal radius ORIF 03/25/2020  She is here for 1 year follow-up today.  She denies any recent issues other than the above-mentioned surgeries and diagnosis of Covid virus.  Denies any anginal or exertional symptoms, palpitations or arrhythmias, orthostatic symptoms, stroke or TIA-like symptoms, PND, orthopnea, bleeding issues, claudication-like symptoms, DVT or PE-like symptoms, or lower extremity edema.  She continues to go to the gym for exercise and tolerating well.  Heart rate is 106 on EKG with atrial fibrillation with RVR, LAFB  Past Medical History:  Diagnosis Date  . Allergic rhinitis   . Depression   . DJD (degenerative joint disease)   . Essential hypertension   . PAF (paroxysmal atrial fibrillation) ([place])     Past Surgical History:  Procedure Laterality Date  . CHOLECYSTECTOMY N/A 05/18/2018   Procedure: LAPAROSCOPIC CHOLECYSTECTOMY;   Surgeon: Virl Cagey, MD;  Location: [facility] OR[facility];  Service: General;  Laterality: N/A;  . COLONOSCOPY N/A 09/27/2019   Procedure: COLONOSCOPY;  Surgeon: Rogene Houston, MD;  Location: [facility];  Service: Endoscopy;  Laterality: N/A;  730  . DILATION AND CURETTAGE OF UTERUS    . TUBAL LIGATION    . VAGINAL HYSTERECTOMY  08/22/2019    Current Outpatient Medications  Medication Sig Dispense Refill  . ALPRAZolam (XANAX) 0.5 MG tablet Take 0.5 mg by mouth at bedtime as needed for anxiety.     Marland Kitchen apixaban (ELIQUIS) 2.5 MG TABS tablet Take 2.5 mg by mouth 2 (two) times daily.    . benazepril (LOTENSIN) 10 MG tablet Take 10 mg by mouth daily.    . diclofenac sodium (VOLTAREN) 1 % GEL Apply 1 application topically 2 (two) times daily as needed (pain).     Marland Kitchen docusate sodium (COLACE) 100 MG capsule Take 1 capsule (100 mg total) by mouth 2 (two) times daily. (Patient taking differently: Take 100 mg by mouth at bedtime. ) 10 capsule 0  . fluticasone (FLONASE) 50 MCG/ACT nasal spray Place 2 sprays into both nostrils daily as needed for allergies.     . Glycerin-Hypromellose-PEG 400 (DRY EYE RELIEF DROPS) 0.2-0.2-1 % SOLN Place 1 drop into both eyes daily as needed (Dry eye).    Marland Kitchen HYDROcodone-acetaminophen (NORCO/VICODIN) 5-325 MG tablet Take 1 tablet by mouth every 4 (four) hours as needed. 10 tablet 0  . ibuprofen (ADVIL,MOTRIN) 200 MG tablet Take 800 mg by mouth every 6 (six) hours as needed for headache, mild pain or moderate  pain.     . montelukast (SINGULAIR) 10 MG tablet Take 10 mg by mouth daily as needed (allergies). Alternate with Loratadine    . diltiazem (CARDIZEM CD) 180 MG 24 hr capsule Take 1 capsule (180 mg total) by mouth daily. 90 capsule 3   No current facility-administered medications for this visit.   Allergies:  Codeine phosphate   Social History: The patient  reports that she quit smoking about 30 years ago. Her smoking use included cigarettes. She started smoking about 58  years ago. She has a 13.50 pack-year smoking history. She has never used smokeless tobacco. She reports that she does not drink alcohol and does not use drugs.   Family History: The patient's family history includes Brain cancer in her mother; Parkinson's disease in her father.   ROS:  Please see the history of present illness. Otherwise, complete review of systems is positive for none.  All other systems are reviewed and negative.   Physical Exam: VS:  BP 138/74   Pulse 100   Ht 5\' 2"  (1.575 m)   Wt 170 lb (77.1 kg)   SpO2 98%   BMI 31.09 kg/m , BMI Body mass index is 31.09 kg/m.  Wt Readings from Last 3 Encounters:  04/29/20 170 lb (77.1 kg)  03/20/20 169 lb (76.7 kg)  10/31/19 165 lb 3.2 oz (74.9 kg)    General: Patient appears comfortable at rest. HEENT: Conjunctiva and lids normal, oropharynx clear with moist mucosa. Neck: Supple, no elevated JVP or carotid bruits, no thyromegaly. Lungs: Clear to auscultation, nonlabored breathing at rest. Cardiac: Regular rate and rhythm, no S3 or significant systolic murmur, no pericardial rub. Abdomen: Soft, nontender, no hepatomegaly, bowel sounds present, no guarding or rebound. Extremities: No pitting edema, distal pulses 2+. Skin: Warm and dry. Musculoskeletal: No kyphosis. Neuropsychiatric: Alert and oriented x3, affect grossly appropriate.  ECG:  An ECG dated 04/29/2020 was personally reviewed today and demonstrated:  Atrial fibrillation with rapid ventricular response with premature ventricular or aberrantly conducted complexes, LAFB, 106 heart rate  Recent Labwork: No results found for requested labs within last 8760 hours.  No results found for: CHOL, TRIG, HDL, CHOLHDL, VLDL, LDLCALC, LDLDIRECT  Other Studies Reviewed Today:  Echocardiogram 02/22/2014: Study Conclusions  - Left ventricle: The cavity size was normal. Wall thickness was increased in a pattern of mild LVH. Systolic function was normal. The estimated  ejection fraction was in the range of 60% to 65%. Wall motion was normal; there were no regional wall motion abnormalities. There was an increased relative contribution of atrial contraction to ventricular filling. Doppler parameters are consistent with abnormal left ventricular relaxation (grade 1 diastolic dysfunction). - Aortic valve: Trileaflet; mildly thickened leaflets. There was no stenosis. - Mitral valve: Mildly thickened leaflets . Mild regurgitation. - Left atrium: The atrium was mildly to moderately dilated. - Tricuspid valve: Mild regurgitation.  Assessment and Plan:  1. Permanent atrial fibrillation ([place])   2. Essential hypertension, benign    1. Permanent atrial fibrillation ([facility]) Heart rate greater than 100 today and irregularly irregular.  EKG shows atrial fibrillation with RVR premature ventricular or aberrantly conducted complexes, LAFB, rate of 106.  Increase diltiazem CD to 180 mg daily.  Continue Eliquis 2.5 mg p.o. twice daily.  Eliquis was recently prescribed by PCP.  No bleeding issues noted per patient.  2. Essential hypertension, benign Blood pressure today 138/74.  Continue benazepril 10 mg.  Hopefully increased dosage of diltiazem will help control blood pressure little better.  Medication Adjustments/Labs and Tests Ordered: Current medicines are reviewed at length with the patient today.  Concerns regarding medicines are outlined above.   Disposition: Follow-up with Dr. Domenic Polite or APP 1 year.  Signed, Levell July, NP 04/29/2020 8:53 AM    [facility] [place], [place], [place] [address] Phone: [phone]; Fax: [phone]

## 2020-04-29 ENCOUNTER — Ambulatory Visit: Payer: Medicare Other | Admitting: Family Medicine

## 2020-04-29 ENCOUNTER — Encounter: Payer: Self-pay | Admitting: Family Medicine

## 2020-04-29 VITALS — BP 138/74 | HR 100 | Ht 62.0 in | Wt 170.0 lb

## 2020-04-29 DIAGNOSIS — I4821 Permanent atrial fibrillation: Secondary | ICD-10-CM

## 2020-04-29 DIAGNOSIS — I10 Essential (primary) hypertension: Secondary | ICD-10-CM | POA: Diagnosis not present

## 2020-04-29 MED ORDER — DILTIAZEM HCL ER COATED BEADS 180 MG PO CP24: 180.0000 mg | ORAL_CAPSULE | Freq: Every day | ORAL | 3 refills | Status: DC

## 2020-04-29 NOTE — Patient Instructions (Signed)
Medication Instructions:  Your physician has recommended you make the following change in your medication:  1.  INCREASE the Diltiazem to 180 mg taking 1 daily  *If you need a refill on your cardiac medications before your next appointment, please call your pharmacy*   Lab Work: None ordered  If you have labs (blood work) drawn today and your tests are completely normal, you will receive your results only by: Marland Kitchen MyChart Message (if you have MyChart) OR . A paper copy in the mail If you have any lab test that is abnormal or we need to change your treatment, we will call you to review the results.   Testing/Procedures: None ordered   Follow-Up: At [facility] you and your health needs are our priority.  As part of our continuing mission to provide you with exceptional heart care, we have created designated Provider Care Teams.  These Care Teams include your primary Cardiologist (physician) and Advanced Practice Providers (APPs -  Physician Assistants and Nurse Practitioners) who all work together to provide you with the care you need, when you need it.  We recommend signing up for the patient portal called "MyChart".  Sign up information is provided on this After Visit Summary.  MyChart is used to connect with patients for Virtual Visits (Telemedicine).  Patients are able to view lab/test results, encounter notes, upcoming appointments, etc.  Non-urgent messages can be sent to your provider as well.   To learn more about what you can do with MyChart, go to [email or web address].    Your next appointment:   12 month(s)  The format for your next appointment:   In Person  Provider:   You may see Rozann Lesches, MD or the following Advanced Practice Provider on your designated Care Team:    Katina Dung, NP    Other Instructions

## 2020-04-30 ENCOUNTER — Encounter (HOSPITAL_COMMUNITY): Payer: Self-pay | Admitting: Occupational Therapy

## 2020-04-30 ENCOUNTER — Ambulatory Visit (HOSPITAL_COMMUNITY): Payer: Medicare Other | Admitting: Occupational Therapy

## 2020-04-30 ENCOUNTER — Other Ambulatory Visit: Payer: Self-pay

## 2020-04-30 DIAGNOSIS — M25531 Pain in right wrist: Secondary | ICD-10-CM

## 2020-04-30 DIAGNOSIS — R278 Other lack of coordination: Secondary | ICD-10-CM

## 2020-04-30 DIAGNOSIS — R29898 Other symptoms and signs involving the musculoskeletal system: Secondary | ICD-10-CM

## 2020-04-30 DIAGNOSIS — M25631 Stiffness of right wrist, not elsewhere classified: Secondary | ICD-10-CM

## 2020-04-30 NOTE — Therapy (Signed)
[place], [place], [address] Phone: [phone]   Fax:  [phone]  Occupational Therapy Treatment  Patient Details  Name: Tara Floyd MRN: [id number] Date of Birth: Mar 27, 1946 Referring Provider (OT): Dr. Caralyn Guile   Encounter Date: 04/30/2020   OT End of Session - 04/30/20 1301    Visit Number 2    Number of Visits 16    Date for OT Re-Evaluation 06/21/20   mini reassess on 8/13   Authorization Type [employer] Medicare    Progress Note Due on Visit 10    OT Start Time 1304    OT Stop Time 1345    OT Time Calculation (min) 41 min    Activity Tolerance Patient tolerated treatment well    Behavior During Therapy [facility] for tasks assessed/performed           Past Medical History:  Diagnosis Date  . Allergic rhinitis   . Depression   . DJD (degenerative joint disease)   . Essential hypertension   . PAF (paroxysmal atrial fibrillation) ([place])     Past Surgical History:  Procedure Laterality Date  . CHOLECYSTECTOMY N/A 05/18/2018   Procedure: LAPAROSCOPIC CHOLECYSTECTOMY;  Surgeon: Virl Cagey, MD;  Location: [facility] ORS;  Service: General;  Laterality: N/A;  . COLONOSCOPY N/A 09/27/2019   Procedure: COLONOSCOPY;  Surgeon: Rogene Houston, MD;  Location: [facility] ENDO SUITE;  Service: Endoscopy;  Laterality: N/A;  730  . DILATION AND CURETTAGE OF UTERUS    . TUBAL LIGATION    . VAGINAL HYSTERECTOMY  08/22/2019    There were no vitals filed for this visit.       [facility] OT Assessment - 04/30/20 1303      Assessment   Medical Diagnosis S/P Right Colles Fracture    Referring Provider (OT) Dr. Caralyn Guile      Precautions   Precautions Other (comment)    Precaution Comments follow IHP - A/ROM through 05/20/20, 8/10 begin P/ROM and strengthening, progress as tolerated     Required Braces or Orthoses Other Brace/Splint    Other Brace/Splint wrist cock up when out and about      Restrictions   Weight Bearing  Restrictions No                    OT Treatments/Exercises (OP) - 04/30/20 1305      Exercises   Exercises Wrist;Hand;Theraputty      Wrist Exercises   Wrist Flexion 10 reps;AROM    Wrist Extension AROM;10 reps    Wrist Radial Deviation AROM;10 reps    Wrist Ulnar Deviation AROM;10 reps    Other wrist exercises Pronation/supination; 10x; AROM    Other wrist exercises Pronation flipping large cards; 5 cards 10x      Hand Exercises   Opposition AROM;10 reps    Other Hand Exercises AROM; Composite flexion/extension 10x      Theraputty   Theraputty - Pinch Yellow; each finger    Theraputty Hand- Locate Pegs Locating 10 pegs in yellow putty      Manual Therapy   Manual Therapy Myofascial release    Manual therapy comments manual therapy completed seperately from all other interventions this date    Myofascial Release myofacial release and manual stretching to right flexor and extensor forearm, wrist, and hand to improve mobility      Fine Motor Coordination (Hand/Wrist)   Fine Motor Coordination Grooved pegs;Manipulation of small objects    Manipulation of small  objects Pegboard Fun with pattern. Pt picking up four pegs at a time to challenge in hand manipulation.     Grooved pegs Picking up five pegs, translation to palm, and then translation back to finger tips and place in board. Mod difficulty with shoulder/elbow hiking and dropping 1-2 pegs each trail                    OT Short Term Goals - 04/30/20 1358      OT SHORT TERM GOAL #1   Title Patient will be educated and independent with HEP for improved RUE mobility, strength, and coordination.    Time 4    Period Weeks    Status On-going    Target Date 05/24/20      OT SHORT TERM GOAL #2   Title Patient will improve RUE A/ROM to [facility] in order to resume all desired B/IADLs and leisure activites.    Time 4    Period Weeks    Status On-going      OT SHORT TERM GOAL #3   Title Patient will improve  right grip strength by 10 pounds and pinch strength by 2 pounds for improved ability to open water bottles without difficulty.    Time 4    Period Weeks    Status On-going      OT SHORT TERM GOAL #4   Title Patient will decrease edema by 1.0 cm in right wrist and hand for greater mobility and comfort when using right arm as dominant with daily tasks.    Time 4    Period Weeks    Status On-going             OT Long Term Goals - 04/30/20 1358      OT LONG TERM GOAL #1   Title Patient will return to PLOF using right hand as dominant with all BIADLS and leisure activities.    Time 8    Period Weeks    Status On-going      OT LONG TERM GOAL #2   Title Patient will improve RUE P/ROM to WNL in order to push self out of bathtub, garden, and comb her hair without difficulty.    Time 8    Period Weeks    Status On-going      OT LONG TERM GOAL #3   Title Patient will improve RUE strength to 5/5 in order to pick up gardening tools and supplies and resume weekly workouts at the rec center.    Time 8    Period Weeks    Status On-going      OT LONG TERM GOAL #4   Title Patient will improve RUE grip and pinch strength to 80% of LUE or better for improved ability to open doors, jars, containers.    Time 8    Period Weeks    Status On-going      OT LONG TERM GOAL #5   Title Patient will improve RUE coordination to WNL as evidence by completing nine hole peg test in 20 seconds or less.    Time 8    Period Weeks    Status On-going      OT LONG TERM GOAL #6   Title Patient will decrease edema by 1.5 cm or more in right wrist and hand for greater mobility and less pain.    Time 8    Period Weeks    Status On-going      OT LONG TERM GOAL #  7   Title Patient will decrease pain to 1/10 or better in right wrist region when completing gardening tasks.    Status On-going                 Plan - 04/30/20 1336    Clinical Impression Statement A: Focused session on AROM, functional  reach, and in-hand manipulation. Pt presenting with decreased FM coorindation and in-hand manipulation as pt dropping pegs during grooved pegboard and pegbard design game. Pt participating in overhead lacing, yellow theraputty pinches and locating items, and card flipping task. Cues for form and technique provided throughout.    Body Structure / Function / Physical Skills ADL;Strength;Pain;Edema;UE functional use;IADL;ROM;Scar mobility;FMC;Skin integrity;Muscle spasms    Plan P: Continue yellow theraputty (no weight bearing) for grasp and pinch, AROM exercises, pronation exercises, and in hand manipulation.    OT Home Exercise Plan eval:  a/rom forearm, wrist, hand and fmc           Patient will benefit from skilled therapeutic intervention in order to improve the following deficits and impairments:   Body Structure / Function / Physical Skills: ADL, Strength, Pain, Edema, UE functional use, IADL, ROM, Scar mobility, FMC, Skin integrity, Muscle spasms       Visit Diagnosis: Pain in right wrist  Stiffness of right wrist, not elsewhere classified  Other symptoms and signs involving the musculoskeletal system  Other lack of coordination    Problem List Patient Active Problem List   Diagnosis Date Noted  . Special screening for malignant neoplasms, colon 06/12/2019  . Postoperative ileus ([place]) 05/20/2018  . Acute hypoxemic respiratory failure ([place]) 05/20/2018  . Acute cholecystitis 05/17/2018  . Persistent atrial fibrillation ([place]) 05/17/2018  . PAF (paroxysmal atrial fibrillation) ([place]) 03/13/2014  . Essential hypertension, benign 02/08/2014    Neal Dy, MSOT, OTR/L 04/30/2020, 1:59 PM  [place], [place], [address] Phone: [phone]   Fax:  [phone]  Name: Tara Floyd MRN: [id number] Date of Birth: 1946/07/30

## 2020-05-03 ENCOUNTER — Other Ambulatory Visit: Payer: Self-pay

## 2020-05-03 ENCOUNTER — Encounter (HOSPITAL_COMMUNITY): Payer: Self-pay | Admitting: Occupational Therapy

## 2020-05-03 ENCOUNTER — Ambulatory Visit (HOSPITAL_COMMUNITY): Payer: Medicare Other | Admitting: Occupational Therapy

## 2020-05-03 DIAGNOSIS — M25531 Pain in right wrist: Secondary | ICD-10-CM

## 2020-05-03 DIAGNOSIS — M25631 Stiffness of right wrist, not elsewhere classified: Secondary | ICD-10-CM

## 2020-05-03 DIAGNOSIS — R29898 Other symptoms and signs involving the musculoskeletal system: Secondary | ICD-10-CM

## 2020-05-03 DIAGNOSIS — R278 Other lack of coordination: Secondary | ICD-10-CM

## 2020-05-03 NOTE — Therapy (Signed)
[place], [place], [address] Phone: [phone]   Fax:  [phone]  Occupational Therapy Treatment  Patient Details  Name: Tara Floyd MRN: [id number] Date of Birth: 1945/11/11 Referring Provider (OT): Dr. Caralyn Guile   Encounter Date: 05/03/2020   OT End of Session - 05/03/20 1314    Visit Number 3    Number of Visits 16    Date for OT Re-Evaluation 06/21/20   mini reassess on 8/13   Authorization Type [employer] Medicare    Progress Note Due on Visit 10    OT Start Time 1300    OT Stop Time 1340    OT Time Calculation (min) 40 min    Activity Tolerance Patient tolerated treatment well    Behavior During Therapy [facility] for tasks assessed/performed           Past Medical History:  Diagnosis Date   Allergic rhinitis    Depression    DJD (degenerative joint disease)    Essential hypertension    PAF (paroxysmal atrial fibrillation) ([place])     Past Surgical History:  Procedure Laterality Date   CHOLECYSTECTOMY N/A 05/18/2018   Procedure: LAPAROSCOPIC CHOLECYSTECTOMY;  Surgeon: Virl Cagey, MD;  Location: [facility] ORS;  Service: General;  Laterality: N/A;   COLONOSCOPY N/A 09/27/2019   Procedure: COLONOSCOPY;  Surgeon: Rogene Houston, MD;  Location: [facility] ENDO SU[facility];  Service: Endoscopy;  Laterality: N/A;  [place] HYSTERECTOMY  08/22/2019    There were no vitals filed for this visit.   Subjective Assessment - 05/03/20 1303    Subjective  s: I have been shucking corn today    Pertinent History Tara Floyd reports that she tripped on a cement crack at a local restaurant on 03/20/20, falling and landing on her right hand.  She presented to the ED and was diagnosed with a right Colle's fracture.  She consulted with Dr. Caralyn Guile, and had surgery to repair the fracture on 03/25/20.  Her cast was removed last week.  She has been referred to occupational  therapy for evaluation and treatment, following the [place].    Special Tests FOTO 54% independent    Patient Stated Goals I want to get back to normal    Pain Onset More than a month ago              [facility] OT Assessment - 05/03/20 0001      Assessment   Medical Diagnosis S/P Right Colles Fracture    Referring Provider (OT) Dr. Caralyn Guile      Precautions   Precautions Other (comment)    Precaution Comments follow IHP - A/ROM through 05/20/20, 8/10 begin P/ROM and strengthening, progress as tolerated     Required Braces or Orthoses Other Brace/Splint    Other Brace/Splint wrist cock up when out and about                    OT Treatments/Exercises (OP) - 05/03/20 1303      Exercises   Exercises Wrist;Hand;Theraputty      Wrist Exercises   Wrist Flexion AROM;10 reps    Wrist Extension AROM;10 reps    Wrist Radial Deviation AROM;10 reps    Wrist Ulnar Deviation AROM;10 reps    Other wrist exercises Pronation/supination; 10x; AROM    Other wrist exercises Yellow putty. wrist flexion/extension to make  circles      Hand Exercises   Opposition AROM;10 reps    Other Hand Exercises AROM; Composite flexion/extension 10x      Theraputty   Theraputty - Roll Rolling into worm; 3x; yellow    Theraputty - Grip Yellow; grasp with forearm supinated and pronated    Theraputty - Pinch Yellow; each finger    Theraputty Hand- Locate Pegs Locating 10 pegs in yellow putty      Functional Reaching Activities   High Level Overhead lacing task to challenge FM coorindation, pinch strength, activity tolerance, and functional reach. Pt requiring>5' to lace and unlace      Manual Therapy   Manual Therapy Myofascial release    Manual therapy comments manual therapy completed seperately from all other interventions this date    Myofascial Release myofacial release and manual stretching to right flexor and extensor forearm, wrist, and hand to improve mobility      Fine Motor  Coordination (Hand/Wrist)   Fine Motor Coordination Grooved pegs;Manipulation of small objects    Manipulation of small objects Pegboard Fun with pattern. Pt picking up five pegs at a time to challenge in hand manipulation.  Min cues for problem solving. Moderate difficulty dropping 2-3 pegs each trial.                    OT Short Term Goals - 04/30/20 1358      OT SHORT TERM GOAL #1   Title Patient will be educated and independent with HEP for improved RUE mobility, strength, and coordination.    Time 4    Period Weeks    Status On-going    Target Date 05/24/20      OT SHORT TERM GOAL #2   Title Patient will improve RUE A/ROM to [facility] in order to resume all desired B/IADLs and leisure activites.    Time 4    Period Weeks    Status On-going      OT SHORT TERM GOAL #3   Title Patient will improve right grip strength by 10 pounds and pinch strength by 2 pounds for improved ability to open water bottles without difficulty.    Time 4    Period Weeks    Status On-going      OT SHORT TERM GOAL #4   Title Patient will decrease edema by 1.0 cm in right wrist and hand for greater mobility and comfort when using right arm as dominant with daily tasks.    Time 4    Period Weeks    Status On-going             OT Long Term Goals - 04/30/20 1358      OT LONG TERM GOAL #1   Title Patient will return to PLOF using right hand as dominant with all BIADLS and leisure activities.    Time 8    Period Weeks    Status On-going      OT LONG TERM GOAL #2   Title Patient will improve RUE P/ROM to WNL in order to push self out of bathtub, garden, and comb her hair without difficulty.    Time 8    Period Weeks    Status On-going      OT LONG TERM GOAL #3   Title Patient will improve RUE strength to 5/5 in order to pick up gardening tools and supplies and resume weekly workouts at the rec center.    Time 8    Period Weeks  Status On-going      OT LONG TERM GOAL #4   Title  Patient will improve RUE grip and pinch strength to 80% of LUE or better for improved ability to open doors, jars, containers.    Time 8    Period Weeks    Status On-going      OT LONG TERM GOAL #5   Title Patient will improve RUE coordination to WNL as evidence by completing nine hole peg test in 20 seconds or less.    Time 8    Period Weeks    Status On-going      OT LONG TERM GOAL #6   Title Patient will decrease edema by 1.5 cm or more in right wrist and hand for greater mobility and less pain.    Time 8    Period Weeks    Status On-going      OT LONG TERM GOAL #7   Title Patient will decrease pain to 1/10 or better in right wrist region when completing gardening tasks.    Status On-going                 Plan - 05/03/20 1510    Clinical Impression Statement A: Continued AROM for hand and wrist, gentle theraputty exercises, and in-hand manipulation; noting difficulty with AROM supination. Pt continues to present with decrease FM coorindation and in-hand manipulation as seen by dropping 2-3 pegs per trial with peg board activity. Pt participating in over head lacing and yellow theraputty including rolling out putty, grasp/pinch, and wrsit extenion/flexion. Cues for form and technique provided throughout.    Body Structure / Function / Physical Skills ADL;Strength;Pain;Edema;UE functional use;IADL;ROM;Scar mobility;FMC;Skin integrity;Muscle spasms    Plan P: Continue yellow theraputty (no weight bearing) for grasp and pinch, AROM exercises, pronation exercises, and in hand manipulation.    OT Home Exercise Plan eval:  a/rom forearm, wrist, hand and fmc           Patient will benefit from skilled therapeutic intervention in order to improve the following deficits and impairments:   Body Structure / Function / Physical Skills: ADL, Strength, Pain, Edema, UE functional use, IADL, ROM, Scar mobility, FMC, Skin integrity, Muscle spasms       Visit Diagnosis: Pain in right  wrist  Stiffness of right wrist, not elsewhere classified  Other symptoms and signs involving the musculoskeletal system  Other lack of coordination    Problem List Patient Active Problem List   Diagnosis Date Noted   Special screening for malignant neoplasms, colon 06/12/2019   Postoperative ileus ([place]) 05/20/2018   Acute hypoxemic respiratory failure ([place]) 05/20/2018   Acute cholecystitis 05/17/2018   Persistent atrial fibrillation ([place]) 05/17/2018   PAF (paroxysmal atrial fibrillation) ([place]) 03/13/2014   Essential hypertension, benign 02/08/2014    Neal Dy, MSOT, OTR/L 05/03/2020, 5:26 PM  [place], [place], [address] Phone: [phone]   Fax:  [phone]  Name: Tara Floyd MRN: [id number] Date of Birth: 04/08/1946

## 2020-05-07 ENCOUNTER — Ambulatory Visit (HOSPITAL_COMMUNITY): Payer: Medicare Other | Admitting: Occupational Therapy

## 2020-05-07 ENCOUNTER — Other Ambulatory Visit: Payer: Self-pay

## 2020-05-07 ENCOUNTER — Encounter (HOSPITAL_COMMUNITY): Payer: Self-pay | Admitting: Occupational Therapy

## 2020-05-07 DIAGNOSIS — M25531 Pain in right wrist: Secondary | ICD-10-CM | POA: Diagnosis not present

## 2020-05-07 DIAGNOSIS — R278 Other lack of coordination: Secondary | ICD-10-CM

## 2020-05-07 DIAGNOSIS — R29898 Other symptoms and signs involving the musculoskeletal system: Secondary | ICD-10-CM

## 2020-05-07 DIAGNOSIS — M25631 Stiffness of right wrist, not elsewhere classified: Secondary | ICD-10-CM

## 2020-05-07 NOTE — Therapy (Signed)
[place], [place], [address] Phone: [phone]   Fax:  [phone]  Occupational Therapy Treatment  Patient Details  Name: Tara Floyd MRN: [id number] Date of Birth: Sep 16, 1946 Referring Provider (OT): Dr. Caralyn Guile   Encounter Date: 05/07/2020   OT End of Session - 05/07/20 0913    Visit Number 4    Number of Visits 16    Date for OT Re-Evaluation 06/21/20   mini reassess on 8/13   Authorization Type [employer] Medicare    Progress Note Due on Visit 10    OT Start Time 0857    OT Stop Time 0939    OT Time Calculation (min) 42 min    Activity Tolerance Patient tolerated treatment well    Behavior During Therapy [facility] for tasks assessed/performed           Past Medical History:  Diagnosis Date  . Allergic rhinitis   . Depression   . DJD (degenerative joint disease)   . Essential hypertension   . PAF (paroxysmal atrial fibrillation) ([place])     Past Surgical History:  Procedure Laterality Date  . CHOLECYSTECTOMY N/A 05/18/2018   Procedure: LAPAROSCOPIC CHOLECYSTECTOMY;  Surgeon: Virl Cagey, MD;  Location: [facility] ORS;  Service: General;  Laterality: N/A;  . COLONOSCOPY N/A 09/27/2019   Procedure: COLONOSCOPY;  Surgeon: Rogene Houston, MD;  Location: [facility] ENDO SU[facility];  Service: Endoscopy;  Laterality: N/A;  730  . DILATION AND CURETTAGE OF UTERUS    . TUBAL LIGATION    . VAGINAL HYSTERECTOMY  08/22/2019    There were no vitals filed for this visit.   Subjective Assessment - 05/07/20 0858    Subjective  S: We went to an auction sale this weekend    Pertinent History Tara Floyd reports that she tripped on a cement crack at a local restaurant on 03/20/20, falling and landing on her right hand.  She presented to the ED and was diagnosed with a right Colle's fracture.  She consulted with Dr. Caralyn Guile, and had surgery to repair the fracture on 03/25/20.  Her cast was removed last week.  She has been referred to  occupational therapy for evaluation and treatment, following the [place].    Currently in Pain? No/denies    Pain Onset --              [facility] OT Assessment - 05/07/20 0859      Assessment   Medical Diagnosis S/P Right Colles Fracture    Referring Provider (OT) Dr. Caralyn Guile      Precautions   Precautions Other (comment)    Precaution Comments follow IHP - A/ROM through 05/20/20, 8/10 begin P/ROM and strengthening, progress as tolerated                     OT Treatments/Exercises (OP) - 05/07/20 0859      Exercises   Exercises Wrist;Hand;Theraputty      Wrist Exercises   Wrist Flexion AROM;15 reps    Wrist Extension AROM;15 reps    Wrist Radial Deviation AROM;15 reps    Wrist Ulnar Deviation AROM;15 reps    Other wrist exercises Pronation/supination; 15x; AROM. Supination limtations to slightly past neurtral    Other wrist exercises Yellow putty. wrist flexion/extension to make circles      Hand Exercises   Opposition AROM;15 reps    Other Hand Exercises AROM; Composite flexion/extension 15x      Theraputty  Theraputty - Roll Rolling into worm; 3x; yellow    Theraputty - Grip Yellow; grasp with forearm supinated and pronated    Theraputty - Pinch Yellow; each finger    Theraputty Hand- Locate Pegs locate 10 beads in yellow putty      Manual Therapy   Manual Therapy Myofascial release    Manual therapy comments manual therapy completed seperately from all other interventions this date    Myofascial Release myofacial release and manual stretching to right flexor and extensor forearm, wrist, and hand to improve mobility      Fine Motor Coordination (Hand/Wrist)   Fine Motor Coordination In hand manipuation training;Stacking coins;Manipulating coins    In Hand Manipulation Training Picking up 10 beads with R hand and translating to palm. Then translating back to finger tips and threading on string. Mod difficulty dropping 4-7 beads per trial. x3     Manipulating coins Picking up 10 pennies, translating to palm, translating to finger tips and place in piggy bank. Cues for not using gravity to bring coins to finger tips.x3    Stacking coins Stacking 10 pennies with RUE. No difficulty                    OT Short Term Goals - 04/30/20 1358      OT SHORT TERM GOAL #1   Title Patient will be educated and independent with HEP for improved RUE mobility, strength, and coordination.    Time 4    Period Weeks    Status On-going    Target Date 05/24/20      OT SHORT TERM GOAL #2   Title Patient will improve RUE A/ROM to [facility] in order to resume all desired B/IADLs and leisure activites.    Time 4    Period Weeks    Status On-going      OT SHORT TERM GOAL #3   Title Patient will improve right grip strength by 10 pounds and pinch strength by 2 pounds for improved ability to open water bottles without difficulty.    Time 4    Period Weeks    Status On-going      OT SHORT TERM GOAL #4   Title Patient will decrease edema by 1.0 cm in right wrist and hand for greater mobility and comfort when using right arm as dominant with daily tasks.    Time 4    Period Weeks    Status On-going             OT Long Term Goals - 04/30/20 1358      OT LONG TERM GOAL #1   Title Patient will return to PLOF using right hand as dominant with all BIADLS and leisure activities.    Time 8    Period Weeks    Status On-going      OT LONG TERM GOAL #2   Title Patient will improve RUE P/ROM to WNL in order to push self out of bathtub, garden, and comb her hair without difficulty.    Time 8    Period Weeks    Status On-going      OT LONG TERM GOAL #3   Title Patient will improve RUE strength to 5/5 in order to pick up gardening tools and supplies and resume weekly workouts at the rec center.    Time 8    Period Weeks    Status On-going      OT LONG TERM GOAL #4   Title Patient  will improve RUE grip and pinch strength to 80% of LUE or better  for improved ability to open doors, jars, containers.    Time 8    Period Weeks    Status On-going      OT LONG TERM GOAL #5   Title Patient will improve RUE coordination to WNL as evidence by completing nine hole peg test in 20 seconds or less.    Time 8    Period Weeks    Status On-going      OT LONG TERM GOAL #6   Title Patient will decrease edema by 1.5 cm or more in right wrist and hand for greater mobility and less pain.    Time 8    Period Weeks    Status On-going      OT LONG TERM GOAL #7   Title Patient will decrease pain to 1/10 or better in right wrist region when completing gardening tasks.    Status On-going                 Plan - 05/07/20 0914    Clinical Impression Statement A: Starting with myofascial release and AROM exerices for hand and wrist. Pt participating in gentle theraputty exercises with grasp, pinch, rolling, and wrist ROM. Continued FM coorindation for dexerity, control, and strengthening. Cues for techniques and decrease compensatory movements.    Body Structure / Function / Physical Skills ADL;Strength;Pain;Edema;UE functional use;IADL;ROM;Scar mobility;FMC;Skin integrity;Muscle spasms    Plan P: Continue yellow theraputty (no weight bearing) for grasp and pinch, AROM exercises, pronation exercises, and in hand manipulation.    OT Home Exercise Plan eval:  a/rom forearm, wrist, hand and fmc           Patient will benefit from skilled therapeutic intervention in order to improve the following deficits and impairments:   Body Structure / Function / Physical Skills: ADL, Strength, Pain, Edema, UE functional use, IADL, ROM, Scar mobility, FMC, Skin integrity, Muscle spasms       Visit Diagnosis: Pain in right wrist  Stiffness of right wrist, not elsewhere classified  Other symptoms and signs involving the musculoskeletal system  Other lack of coordination    Problem List Patient Active Problem List   Diagnosis Date Noted  .  Special screening for malignant neoplasms, colon 06/12/2019  . Postoperative ileus ([place]) 05/20/2018  . Acute hypoxemic respiratory failure ([place]) 05/20/2018  . Acute cholecystitis 05/17/2018  . Persistent atrial fibrillation ([place]) 05/17/2018  . PAF (paroxysmal atrial fibrillation) ([place]) 03/13/2014  . Essential hypertension, benign 02/08/2014    Neal Dy, MSOT, OTR/L 05/07/2020, 12:40 PM  [place], [place], [address] Phone: [phone]   Fax:  [phone]  Name: Tara Floyd MRN: [id number] Date of Birth: [birth year]

## 2020-05-08 ENCOUNTER — Telehealth: Payer: Self-pay | Admitting: Family Medicine

## 2020-05-08 NOTE — Telephone Encounter (Signed)
Since increased dose of diltiazem to 180 mg, she is having worsening dizziness. BP while lightheaded ranges from 105-120/77-80 & HR 88-90. Denies chest pain or SOB. Reports she has already taken diltiazem er 180 mg this morning. Advise to take the 120 mg diltiazem er tomorrow, continue to monitor your BP & HR and let our office know if her HR started to increase again. Verbalized understanding of plan.

## 2020-05-08 NOTE — Telephone Encounter (Signed)
Pt called stating she's not going to be able to take the diltiazem (CARDIZEM CD) 180 MG 24 hr capsule [[id number]]  It's making her swimmy headed when she tries to move around  Please call the pt @ [phone]

## 2020-05-08 NOTE — Telephone Encounter (Signed)
I agree, if she continues to have issues with rapid a fib and dizziness we should refer her to Atrial fibrillation clinic. Thank You

## 2020-05-09 ENCOUNTER — Other Ambulatory Visit: Payer: Self-pay

## 2020-05-09 ENCOUNTER — Encounter (HOSPITAL_COMMUNITY): Payer: Self-pay | Admitting: Occupational Therapy

## 2020-05-09 ENCOUNTER — Ambulatory Visit (HOSPITAL_COMMUNITY): Payer: Medicare Other | Admitting: Occupational Therapy

## 2020-05-09 DIAGNOSIS — M25531 Pain in right wrist: Secondary | ICD-10-CM

## 2020-05-09 DIAGNOSIS — R278 Other lack of coordination: Secondary | ICD-10-CM

## 2020-05-09 DIAGNOSIS — M25631 Stiffness of right wrist, not elsewhere classified: Secondary | ICD-10-CM

## 2020-05-09 DIAGNOSIS — R29898 Other symptoms and signs involving the musculoskeletal system: Secondary | ICD-10-CM

## 2020-05-09 NOTE — Therapy (Signed)
[place], [place], [address] Phone: [phone]   Fax:  [phone]  Occupational Therapy Treatment  Patient Details  Name: Tara Floyd MRN: [id number] Date of Birth: 09-12-1946 Referring Provider (OT): Dr. Caralyn Guile   Encounter Date: 05/09/2020   OT End of Session - 05/09/20 1630    Visit Number 5    Number of Visits 16    Date for OT Re-Evaluation 06/21/20   mini reassess on 8/13   Authorization Type [employer] Medicare    Progress Note Due on Visit 10    OT Start Time 1554    OT Stop Time 1635    OT Time Calculation (min) 41 min    Activity Tolerance Patient tolerated treatment well    Behavior During Therapy [facility] for tasks assessed/performed           Past Medical History:  Diagnosis Date   Allergic rhinitis    Depression    DJD (degenerative joint disease)    Essential hypertension    PAF (paroxysmal atrial fibrillation) ([place])     Past Surgical History:  Procedure Laterality Date   CHOLECYSTECTOMY N/A 05/18/2018   Procedure: LAPAROSCOPIC CHOLECYSTECTOMY;  Surgeon: Virl Cagey, MD;  Location: [facility] ORS;  Service: General;  Laterality: N/A;   COLONOSCOPY N/A 09/27/2019   Procedure: COLONOSCOPY;  Surgeon: Rogene Houston, MD;  Location: [facility] ENDO SUITE;  Service: Endoscopy;  Laterality: N/A;  [place] HYSTERECTOMY  08/22/2019    There were no vitals filed for this visit.   Subjective Assessment - 05/09/20 1554    Subjective  S: The doctor said I'm doing good.    Currently in Pain? No/denies              [facility] OT Assessment - 05/09/20 1554      Assessment   Medical Diagnosis S/P Right Colles Fracture      Precautions   Precautions Other (comment)    Precaution Comments follow IHP - A/ROM through 05/20/20, 8/10 begin P/ROM and strengthening, progress as tolerated                     OT Treatments/Exercises (OP)  - 05/09/20 1556      Exercises   Exercises Wrist;Hand;Theraputty      Wrist Exercises   Wrist Flexion AROM;15 reps    Wrist Extension AROM;15 reps    Wrist Radial Deviation AROM;15 reps    Wrist Ulnar Deviation AROM;15 reps    Other wrist exercises Pronation/supination; 15x; AROM. Supination limtations to slightly past neurtral    Other wrist exercises supination/pronation, A/ROM 15X      Additional Wrist Exercises   Sponges 24      Hand Exercises   Opposition AROM;15 reps   to MCP of 5th digit     Theraputty   Theraputty - Roll yellow    Theraputty - Grip yellow-supinated and pronated    Theraputty - Pinch yellow-lateral and 3 point      Manual Therapy   Manual Therapy Myofascial release    Manual therapy comments manual therapy completed seperately from all other interventions this date    Myofascial Release myofacial release and manual stretching to right flexor and extensor forearm, wrist, and hand to improve mobility      Fine Motor Coordination (Hand/Wrist)   Fine Motor Coordination In hand manipuation training;Manipulating coins  Manipulating coins Pt holding 5, 10, and 8 coins in palm and working on palm to fingertip translation to move to fingers and place in slotted container. Increased time for task completion                    OT Short Term Goals - 04/30/20 1358      OT SHORT TERM GOAL #1   Title Patient will be educated and independent with HEP for improved RUE mobility, strength, and coordination.    Time 4    Period Weeks    Status On-going    Target Date 05/24/20      OT SHORT TERM GOAL #2   Title Patient will improve RUE A/ROM to [facility] in order to resume all desired B/IADLs and leisure activites.    Time 4    Period Weeks    Status On-going      OT SHORT TERM GOAL #3   Title Patient will improve right grip strength by 10 pounds and pinch strength by 2 pounds for improved ability to open water bottles without difficulty.    Time 4     Period Weeks    Status On-going      OT SHORT TERM GOAL #4   Title Patient will decrease edema by 1.0 cm in right wrist and hand for greater mobility and comfort when using right arm as dominant with daily tasks.    Time 4    Period Weeks    Status On-going             OT Long Term Goals - 04/30/20 1358      OT LONG TERM GOAL #1   Title Patient will return to PLOF using right hand as dominant with all BIADLS and leisure activities.    Time 8    Period Weeks    Status On-going      OT LONG TERM GOAL #2   Title Patient will improve RUE P/ROM to WNL in order to push self out of bathtub, garden, and comb her hair without difficulty.    Time 8    Period Weeks    Status On-going      OT LONG TERM GOAL #3   Title Patient will improve RUE strength to 5/5 in order to pick up gardening tools and supplies and resume weekly workouts at the rec center.    Time 8    Period Weeks    Status On-going      OT LONG TERM GOAL #4   Title Patient will improve RUE grip and pinch strength to 80% of LUE or better for improved ability to open doors, jars, containers.    Time 8    Period Weeks    Status On-going      OT LONG TERM GOAL #5   Title Patient will improve RUE coordination to WNL as evidence by completing nine hole peg test in 20 seconds or less.    Time 8    Period Weeks    Status On-going      OT LONG TERM GOAL #6   Title Patient will decrease edema by 1.5 cm or more in right wrist and hand for greater mobility and less pain.    Time 8    Period Weeks    Status On-going      OT LONG TERM GOAL #7   Title Patient will decrease pain to 1/10 or better in right wrist region when completing gardening tasks.  Status On-going                 Plan - 05/09/20 1625    Clinical Impression Statement A: Continued with manual therapy to address fascial and scar restrictions. Pt completing A/ROM for wrist and forearm, is most limited with supination-cuing for keeping elbow stable  during tasks. Added sponges for in-hand manipulation and gentle grip strengthening. Verbal cuing for form and technique.    Body Structure / Function / Physical Skills ADL;Strength;Pain;Edema;UE functional use;IADL;ROM;Scar mobility;FMC;Skin integrity;Muscle spasms    Plan P: continue with A/ROM and gentle grip strengthening, in-hand manipulation    OT Home Exercise Plan eval:  a/rom forearm, wrist, hand and fmc    Consulted and Agree with Plan of Care Patient           Patient will benefit from skilled therapeutic intervention in order to improve the following deficits and impairments:   Body Structure / Function / Physical Skills: ADL, Strength, Pain, Edema, UE functional use, IADL, ROM, Scar mobility, FMC, Skin integrity, Muscle spasms       Visit Diagnosis: Pain in right wrist  Stiffness of right wrist, not elsewhere classified  Other symptoms and signs involving the musculoskeletal system  Other lack of coordination    Problem List Patient Active Problem List   Diagnosis Date Noted   Special screening for malignant neoplasms, colon 06/12/2019   Postoperative ileus ([place]) 05/20/2018   Acute hypoxemic respiratory failure ([place]) 05/20/2018   Acute cholecystitis 05/17/2018   Persistent atrial fibrillation ([place]) 05/17/2018   PAF (paroxysmal atrial fibrillation) ([place]) 03/13/2014   Essential hypertension, benign 02/08/2014   Guadelupe Sabin, OTR/L  [phone] 05/09/2020, 4:36 PM  [place], [place], [address] Phone: [phone]   Fax:  [phone]  Name: Tara Floyd MRN: [id number] Date of Birth: 05-Oct-1946

## 2020-05-10 MED ORDER — DILTIAZEM HCL ER COATED BEADS 120 MG PO CP24: 120.0000 mg | ORAL_CAPSULE | Freq: Every day | ORAL | 3 refills | Status: DC

## 2020-05-14 ENCOUNTER — Other Ambulatory Visit: Payer: Self-pay

## 2020-05-14 ENCOUNTER — Ambulatory Visit (HOSPITAL_COMMUNITY): Payer: Medicare Other | Attending: Orthopedic Surgery | Admitting: Occupational Therapy

## 2020-05-14 ENCOUNTER — Encounter (HOSPITAL_COMMUNITY): Payer: Self-pay | Admitting: Occupational Therapy

## 2020-05-14 DIAGNOSIS — M25631 Stiffness of right wrist, not elsewhere classified: Secondary | ICD-10-CM | POA: Diagnosis present

## 2020-05-14 DIAGNOSIS — M25531 Pain in right wrist: Secondary | ICD-10-CM | POA: Diagnosis not present

## 2020-05-14 DIAGNOSIS — R29898 Other symptoms and signs involving the musculoskeletal system: Secondary | ICD-10-CM | POA: Insufficient documentation

## 2020-05-14 DIAGNOSIS — R278 Other lack of coordination: Secondary | ICD-10-CM | POA: Insufficient documentation

## 2020-05-14 NOTE — Therapy (Signed)
[place], [place], [address] Phone: [phone]   Fax:  [phone]  Occupational Therapy Treatment  Patient Details  Name: Tara Floyd MRN: [id number] Date of Birth: 29-Jul-1946 Referring Provider (OT): Dr. Caralyn Guile   Encounter Date: 05/14/2020   OT End of Session - 05/14/20 0920    Visit Number 6    Number of Visits 16    Date for OT Re-Evaluation 06/21/20   mini reassess on 8/13   Authorization Type [employer] Medicare    Progress Note Due on Visit 10    OT Start Time 0901    OT Stop Time 0940    OT Time Calculation (min) 39 min    Activity Tolerance Patient tolerated treatment well    Behavior During Therapy [facility] for tasks assessed/performed           Past Medical History:  Diagnosis Date  . Allergic rhinitis   . Depression   . DJD (degenerative joint disease)   . Essential hypertension   . PAF (paroxysmal atrial fibrillation) ([place])     Past Surgical History:  Procedure Laterality Date  . CHOLECYSTECTOMY N/A 05/18/2018   Procedure: LAPAROSCOPIC CHOLECYSTECTOMY;  Surgeon: Virl Cagey, MD;  Location: [facility] ORS;  Service: General;  Laterality: N/A;  . COLONOSCOPY N/A 09/27/2019   Procedure: COLONOSCOPY;  Surgeon: Rogene Houston, MD;  Location: [facility] ENDO SUITE;  Service: Endoscopy;  Laterality: N/A;  730  . DILATION AND CURETTAGE OF UTERUS    . TUBAL LIGATION    . VAGINAL HYSTERECTOMY  08/22/2019    There were no vitals filed for this visit.   Subjective Assessment - 05/14/20 0902    Subjective  S: I've been doing good with it. I worked outside all day yesterday.    Currently in Pain? No/denies                        OT Treatments/Exercises (OP) - 05/14/20 0903      Exercises   Exercises Wrist;Hand;Theraputty      Wrist Exercises   Wrist Flexion AROM;15 reps    Wrist Extension AROM;15 reps    Wrist Radial Deviation AROM;15 reps    Wrist Ulnar Deviation AROM;15 reps    Other  wrist exercises Pronation/supination; 15x; AROM. Supination limtations to slightly past neurtral    Other wrist exercises Large cards; supination to flip cards over. 4 cards 10x      Hand Exercises   Opposition AROM;15 reps   to MCP of 5th digit   Other Hand Exercises Yellow putty; wrist flexion/extension with PVC; 2x      Theraputty   Theraputty - Roll yellow into worm; 4x    Theraputty - Grip yellow-supinated and pronated    Theraputty - Pinch yellow; pinch with each digit    Theraputty Hand- Locate Pegs locate 10 beads in yellow putty      Manual Therapy   Manual Therapy Myofascial release    Manual therapy comments manual therapy completed seperately from all other interventions this date    Myofascial Release myofacial release and manual stretching to right flexor and extensor forearm, wrist, and hand to improve mobility      Fine Motor Coordination (Hand/Wrist)   Fine Motor Coordination Grooved pegs    Grooved pegs Picking up five pegs, translation to palm, and then translation back to finger tips and place in board. Mod difficulty with shoulder/elbow hiking and dropping 1-2 pegs  each trail. Cues for no shoulder hiking and not using gravity to bring pegs to finger tips.                    OT Short Term Goals - 04/30/20 1358      OT SHORT TERM GOAL #1   Title Patient will be educated and independent with HEP for improved RUE mobility, strength, and coordination.    Time 4    Period Weeks    Status On-going    Target Date 05/24/20      OT SHORT TERM GOAL #2   Title Patient will improve RUE A/ROM to [facility] in order to resume all desired B/IADLs and leisure activites.    Time 4    Period Weeks    Status On-going      OT SHORT TERM GOAL #3   Title Patient will improve right grip strength by 10 pounds and pinch strength by 2 pounds for improved ability to open water bottles without difficulty.    Time 4    Period Weeks    Status On-going      OT SHORT TERM GOAL #4     Title Patient will decrease edema by 1.0 cm in right wrist and hand for greater mobility and comfort when using right arm as dominant with daily tasks.    Time 4    Period Weeks    Status On-going             OT Long Term Goals - 04/30/20 1358      OT LONG TERM GOAL #1   Title Patient will return to PLOF using right hand as dominant with all BIADLS and leisure activities.    Time 8    Period Weeks    Status On-going      OT LONG TERM GOAL #2   Title Patient will improve RUE P/ROM to WNL in order to push self out of bathtub, garden, and comb her hair without difficulty.    Time 8    Period Weeks    Status On-going      OT LONG TERM GOAL #3   Title Patient will improve RUE strength to 5/5 in order to pick up gardening tools and supplies and resume weekly workouts at the rec center.    Time 8    Period Weeks    Status On-going      OT LONG TERM GOAL #4   Title Patient will improve RUE grip and pinch strength to 80% of LUE or better for improved ability to open doors, jars, containers.    Time 8    Period Weeks    Status On-going      OT LONG TERM GOAL #5   Title Patient will improve RUE coordination to WNL as evidence by completing nine hole peg test in 20 seconds or less.    Time 8    Period Weeks    Status On-going      OT LONG TERM GOAL #6   Title Patient will decrease edema by 1.5 cm or more in right wrist and hand for greater mobility and less pain.    Time 8    Period Weeks    Status On-going      OT LONG TERM GOAL #7   Title Patient will decrease pain to 1/10 or better in right wrist region when completing gardening tasks.    Status On-going  Plan - 05/14/20 0943    Clinical Impression Statement A: Continued with manual therapy to address fascial and scar restrictions. Pt completing A/ROM for hand, wrist, and forearm; continues to present with limitations for supination. Continued gentle strengthening with yellow putty. FM  coordination challenged with grooved pegs; Moderate difficulty with dropping 1-2 pegs per trial. Verbal cuing for form and technique.    Body Structure / Function / Physical Skills ADL;Strength;Pain;Edema;UE functional use;IADL;ROM;Scar mobility;FMC;Skin integrity;Muscle spasms    Plan P: continue with A/ROM and gentle grip strengthening, in-hand manipulation    OT Home Exercise Plan eval:  a/rom forearm, wrist, hand and fmc    Consulted and Agree with Plan of Care Patient           Patient will benefit from skilled therapeutic intervention in order to improve the following deficits and impairments:   Body Structure / Function / Physical Skills: ADL, Strength, Pain, Edema, UE functional use, IADL, ROM, Scar mobility, FMC, Skin integrity, Muscle spasms       Visit Diagnosis: Pain in right wrist  Stiffness of right wrist, not elsewhere classified  Other symptoms and signs involving the musculoskeletal system  Other lack of coordination    Problem List Patient Active Problem List   Diagnosis Date Noted  . Special screening for malignant neoplasms, colon 06/12/2019  . Postoperative ileus ([place]) 05/20/2018  . Acute hypoxemic respiratory failure ([place]) 05/20/2018  . Acute cholecystitis 05/17/2018  . Persistent atrial fibrillation ([place]) 05/17/2018  . PAF (paroxysmal atrial fibrillation) ([place]) 03/13/2014  . Essential hypertension, benign 02/08/2014    Neal Dy, MSOT, OTR/L 05/14/2020, 10:15 AM  [place], [place], [address] Phone: [phone]   Fax:  [phone]  Name: MELEANE SELINGER MRN: [id number] Date of Birth: April 05, 1946

## 2020-05-16 ENCOUNTER — Ambulatory Visit (HOSPITAL_COMMUNITY): Payer: Medicare Other | Admitting: Occupational Therapy

## 2020-05-16 ENCOUNTER — Other Ambulatory Visit: Payer: Self-pay

## 2020-05-16 ENCOUNTER — Encounter (HOSPITAL_COMMUNITY): Payer: Self-pay | Admitting: Occupational Therapy

## 2020-05-16 DIAGNOSIS — M25531 Pain in right wrist: Secondary | ICD-10-CM

## 2020-05-16 DIAGNOSIS — R29898 Other symptoms and signs involving the musculoskeletal system: Secondary | ICD-10-CM

## 2020-05-16 DIAGNOSIS — M25631 Stiffness of right wrist, not elsewhere classified: Secondary | ICD-10-CM

## 2020-05-16 DIAGNOSIS — R278 Other lack of coordination: Secondary | ICD-10-CM

## 2020-05-16 NOTE — Therapy (Signed)
[place], [place], [address] Phone: [phone]   Fax:  [phone]  Occupational Therapy Treatment  Patient Details  Name: Tara Floyd MRN: [id number] Date of Birth: [birth year] Referring Provider (OT): Dr. Caralyn Guile   Encounter Date: 05/16/2020   OT End of Session - 05/16/20 0920    Visit Number 7    Number of Visits 16    Date for OT Re-Evaluation 06/21/20   mini reassess on 8/13   Authorization Type [employer] Medicare    Progress Note Due on Visit 10    OT Start Time 0905    OT Stop Time 0945    OT Time Calculation (min) 40 min    Activity Tolerance Patient tolerated treatment well    Behavior During Therapy [facility] for tasks assessed/performed           Past Medical History:  Diagnosis Date  . Allergic rhinitis   . Depression   . DJD (degenerative joint disease)   . Essential hypertension   . PAF (paroxysmal atrial fibrillation) ([place])     Past Surgical History:  Procedure Laterality Date  . CHOLECYSTECTOMY N/A 05/18/2018   Procedure: LAPAROSCOPIC CHOLECYSTECTOMY;  Surgeon: Virl Cagey, MD;  Location: [facility] ORS;  Service: General;  Laterality: N/A;  . COLONOSCOPY N/A 09/27/2019   Procedure: COLONOSCOPY;  Surgeon: Rogene Houston, MD;  Location: [facility] ENDO SUITE;  Service: Endoscopy;  Laterality: N/A;  730  . DILATION AND CURETTAGE OF UTERUS    . TUBAL LIGATION    . VAGINAL HYSTERECTOMY  08/22/2019    There were no vitals filed for this visit.   Subjective Assessment - 05/16/20 0905    Subjective  S: "I massaged it several time yesterday" and "I can now open those big bottles of water"    Pertinent History Tara Floyd reports that she tripped on a cement crack at a local restaurant on 03/20/20, falling and landing on her right hand.  She presented to the ED and was diagnosed with a right Colle's fracture.  She consulted with Dr. Caralyn Guile, and had surgery to repair the fracture on 03/25/20.  Her cast was removed  last week.  She has been referred to occupational therapy for evaluation and treatment, following the [place].    Currently in Pain? No/denies              [facility] OT Assessment - 05/16/20 1249      Assessment   Medical Diagnosis S/P Right Colles Fracture    Referring Provider (OT) Dr. Caralyn Guile      Precautions   Precautions Other (comment)    Precaution Comments follow IHP - A/ROM through 05/20/20, 8/10 begin P/ROM and strengthening, progress as tolerated                     OT Treatments/Exercises (OP) - 05/16/20 0906      Exercises   Exercises Wrist;Hand;Theraputty;Elbow      Elbow Exercises   Forearm Supination AROM;15 reps    Forearm Pronation AROM;15 reps    Other elbow exercises Pronation/supination to turn velcro pad; 15x      Additional Elbow Exercises   Theraputty - Roll yellow into worm; 4x    Theraputty - Grip Grasp and squeeze in pronation      Wrist Exercises   Wrist Flexion AROM;15 reps    Wrist Extension AROM;15 reps    Wrist Radial Deviation AROM;15 reps    Wrist  Ulnar Deviation AROM;15 reps    Other wrist exercises --      Hand Exercises   Opposition AROM;15 reps   to MCP of 5th digit   Other Hand Exercises Yellow putty; wrist flexion/extension with PVC; 2x      Theraputty   Theraputty - Pinch yellow; pinch with each digit      Manual Therapy   Manual Therapy Myofascial release    Manual therapy comments manual therapy completed seperately from all other interventions this date    Myofascial Release myofacial release and manual stretching to right flexor and extensor forearm, wrist, and hand to improve mobility                    OT Short Term Goals - 04/30/20 1358      OT SHORT TERM GOAL #1   Title Patient will be educated and independent with HEP for improved RUE mobility, strength, and coordination.    Time 4    Period Weeks    Status On-going    Target Date 05/24/20      OT SHORT TERM GOAL #2   Title  Patient will improve RUE A/ROM to [facility] in order to resume all desired B/IADLs and leisure activites.    Time 4    Period Weeks    Status On-going      OT SHORT TERM GOAL #3   Title Patient will improve right grip strength by 10 pounds and pinch strength by 2 pounds for improved ability to open water bottles without difficulty.    Time 4    Period Weeks    Status On-going      OT SHORT TERM GOAL #4   Title Patient will decrease edema by 1.0 cm in right wrist and hand for greater mobility and comfort when using right arm as dominant with daily tasks.    Time 4    Period Weeks    Status On-going             OT Long Term Goals - 04/30/20 1358      OT LONG TERM GOAL #1   Title Patient will return to PLOF using right hand as dominant with all BIADLS and leisure activities.    Time 8    Period Weeks    Status On-going      OT LONG TERM GOAL #2   Title Patient will improve RUE P/ROM to WNL in order to push self out of bathtub, garden, and comb her hair without difficulty.    Time 8    Period Weeks    Status On-going      OT LONG TERM GOAL #3   Title Patient will improve RUE strength to 5/5 in order to pick up gardening tools and supplies and resume weekly workouts at the rec center.    Time 8    Period Weeks    Status On-going      OT LONG TERM GOAL #4   Title Patient will improve RUE grip and pinch strength to 80% of LUE or better for improved ability to open doors, jars, containers.    Time 8    Period Weeks    Status On-going      OT LONG TERM GOAL #5   Title Patient will improve RUE coordination to WNL as evidence by completing nine hole peg test in 20 seconds or less.    Time 8    Period Weeks    Status On-going  OT LONG TERM GOAL #6   Title Patient will decrease edema by 1.5 cm or more in right wrist and hand for greater mobility and less pain.    Time 8    Period Weeks    Status On-going      OT LONG TERM GOAL #7   Title Patient will decrease pain to  1/10 or better in right wrist region when completing gardening tasks.    Status On-going                 Plan - 05/16/20 8889    Clinical Impression Statement A: Continued with myofascial release and AROM at hand, wrist, and forearm. Pt performing supination/pronation exercises with velcro board. Continued gentle strengthening with yellow putty. In-hand manipualtion with picking up and maniplating pennies. Cues throughout for form and technique.    Body Structure / Function / Physical Skills ADL;Strength;Pain;Edema;UE functional use;IADL;ROM;Scar mobility;FMC;Skin integrity;Muscle spasms    Plan P: continue with A/ROM and gentle grip strengthening, in-hand manipulation. 05/21/20 start PROM    OT Home Exercise Plan eval:  a/rom forearm, wrist, hand and fmc    Consulted and Agree with Plan of Care Patient           Patient will benefit from skilled therapeutic intervention in order to improve the following deficits and impairments:   Body Structure / Function / Physical Skills: ADL, Strength, Pain, Edema, UE functional use, IADL, ROM, Scar mobility, FMC, Skin integrity, Muscle spasms       Visit Diagnosis: Pain in right wrist  Stiffness of right wrist, not elsewhere classified  Other symptoms and signs involving the musculoskeletal system  Other lack of coordination    Problem List Patient Active Problem List   Diagnosis Date Noted  . Special screening for malignant neoplasms, colon 06/12/2019  . Postoperative ileus ([place]) 05/20/2018  . Acute hypoxemic respiratory failure ([place]) 05/20/2018  . Acute cholecystitis 05/17/2018  . Persistent atrial fibrillation ([place]) 05/17/2018  . PAF (paroxysmal atrial fibrillation) ([place]) 03/13/2014  . Essential hypertension, benign 02/08/2014    Neal Dy, MSOT, OTR/L 05/16/2020, 12:50 PM  [place], [place], [address] Phone: [phone]   Fax:   [phone]  Name: Tara Floyd MRN: [id number] Date of Birth: 07-09-1946

## 2020-05-21 ENCOUNTER — Other Ambulatory Visit: Payer: Self-pay

## 2020-05-21 ENCOUNTER — Encounter (HOSPITAL_COMMUNITY): Payer: Self-pay

## 2020-05-21 ENCOUNTER — Ambulatory Visit (HOSPITAL_COMMUNITY): Payer: Medicare Other

## 2020-05-21 DIAGNOSIS — M25531 Pain in right wrist: Secondary | ICD-10-CM | POA: Diagnosis not present

## 2020-05-21 DIAGNOSIS — R29898 Other symptoms and signs involving the musculoskeletal system: Secondary | ICD-10-CM

## 2020-05-21 DIAGNOSIS — M25631 Stiffness of right wrist, not elsewhere classified: Secondary | ICD-10-CM

## 2020-05-21 DIAGNOSIS — R278 Other lack of coordination: Secondary | ICD-10-CM

## 2020-05-21 NOTE — Therapy (Signed)
[place], [place], [address] Phone: [phone]   Fax:  [phone]  Occupational Therapy Treatment  Patient Details  Name: Tara Floyd MRN: [id number] Date of Birth: 1945-10-29 Referring Provider (OT): Dr. Caralyn Guile   Encounter Date: 05/21/2020   OT End of Session - 05/21/20 0847    Visit Number 8    Number of Visits 16    Date for OT Re-Evaluation 06/21/20   mini reassess on 8/13   Authorization Type [employer] Medicare    Progress Note Due on Visit 10    OT Start Time 0815    OT Stop Time 0853    OT Time Calculation (min) 38 min    Activity Tolerance Patient tolerated treatment well    Behavior During Therapy [facility] for tasks assessed/performed           Past Medical History:  Diagnosis Date  . Allergic rhinitis   . Depression   . DJD (degenerative joint disease)   . Essential hypertension   . PAF (paroxysmal atrial fibrillation) ([place])     Past Surgical History:  Procedure Laterality Date  . CHOLECYSTECTOMY N/A 05/18/2018   Procedure: LAPAROSCOPIC CHOLECYSTECTOMY;  Surgeon: Virl Cagey, MD;  Location: [facility] ORS;  Service: General;  Laterality: N/A;  . COLONOSCOPY N/A 09/27/2019   Procedure: COLONOSCOPY;  Surgeon: Rogene Houston, MD;  Location: [facility] ENDO SUITE;  Service: Endoscopy;  Laterality: N/A;  730  . DILATION AND CURETTAGE OF UTERUS    . TUBAL LIGATION    . VAGINAL HYSTERECTOMY  08/22/2019    There were no vitals filed for this visit.   Subjective Assessment - 05/21/20 0823    Subjective  S: I think it's just sore from what I did yesterday.    Currently in Pain? Yes    Pain Score 2     Pain Location Wrist    Pain Orientation Right    Pain Descriptors / Indicators Sore    Pain Type Acute pain    Pain Radiating Towards N/A    Pain Onset Yesterday    Pain Frequency Occasional    Aggravating Factors  Vacuuming yesterdau    Pain Relieving Factors heat during therapy    Effect of Pain on Daily  Activities minimal effect    Multiple Pain Sites No              [facility] OT Assessment - 05/21/20 0825      Assessment   Medical Diagnosis S/P Right Colles Fracture    Referring Provider (OT) Dr. Caralyn Guile      Precautions   Precautions Other (comment)    Precaution Comments follow IHP - A/ROM through 05/20/20, 8/10 begin P/ROM and strengthening, progress as tolerated                     OT Treatments/Exercises (OP) - 05/21/20 0826      Exercises   Exercises Wrist;Hand;Theraputty;Elbow      Wrist Exercises   Wrist Flexion Strengthening;10 reps    Bar Weights/Barbell (Wrist Flexion) 1 lb    Wrist Extension Strengthening;10 reps    Bar Weights/Barbell (Wrist Extension) 1 lb    Other wrist exercises Self wrist stretches: 10X holding for 5-10 seconds. extension, flexion, supination      Hand Exercises   Other Hand Exercises Yellow putty: 3 minutes each. Grip, lateral and 3 point pinch      Modalities   Modalities Moist Heat  Moist Heat Therapy   Number Minutes Moist Heat 5 Minutes    Moist Heat Location Wrist                  OT Education - 05/21/20 0844    Education Details Updated HEP: wrist stretches. yellow putty for grip and pinch. Pt may stop previous HEP from eval.    Person(s) Educated Patient    Methods Explanation;Demonstration;Verbal cues;Handout    Comprehension Verbalized understanding;Returned demonstration            OT Short Term Goals - 04/30/20 1358      OT SHORT TERM GOAL #1   Title Patient will be educated and independent with HEP for improved RUE mobility, strength, and coordination.    Time 4    Period Weeks    Status On-going    Target Date 05/24/20      OT SHORT TERM GOAL #2   Title Patient will improve RUE A/ROM to [facility] in order to resume all desired B/IADLs and leisure activites.    Time 4    Period Weeks    Status On-going      OT SHORT TERM GOAL #3   Title Patient will improve right grip strength by 10 pounds  and pinch strength by 2 pounds for improved ability to open water bottles without difficulty.    Time 4    Period Weeks    Status On-going      OT SHORT TERM GOAL #4   Title Patient will decrease edema by 1.0 cm in right wrist and hand for greater mobility and comfort when using right arm as dominant with daily tasks.    Time 4    Period Weeks    Status On-going             OT Long Term Goals - 04/30/20 1358      OT LONG TERM GOAL #1   Title Patient will return to PLOF using right hand as dominant with all BIADLS and leisure activities.    Time 8    Period Weeks    Status On-going      OT LONG TERM GOAL #2   Title Patient will improve RUE P/ROM to WNL in order to push self out of bathtub, garden, and comb her hair without difficulty.    Time 8    Period Weeks    Status On-going      OT LONG TERM GOAL #3   Title Patient will improve RUE strength to 5/5 in order to pick up gardening tools and supplies and resume weekly workouts at the rec center.    Time 8    Period Weeks    Status On-going      OT LONG TERM GOAL #4   Title Patient will improve RUE grip and pinch strength to 80% of LUE or better for improved ability to open doors, jars, containers.    Time 8    Period Weeks    Status On-going      OT LONG TERM GOAL #5   Title Patient will improve RUE coordination to WNL as evidence by completing nine hole peg test in 20 seconds or less.    Time 8    Period Weeks    Status On-going      OT LONG TERM GOAL #6   Title Patient will decrease edema by 1.5 cm or more in right wrist and hand for greater mobility and less pain.    Time  8    Period Weeks    Status On-going      OT LONG TERM GOAL #7   Title Patient will decrease pain to 1/10 or better in right wrist region when completing gardening tasks.    Status On-going                 Plan - 05/21/20 0847    Clinical Impression Statement A:HEP updated and reviewed. Patient was able to demonstrate proper form  and technique with cueing. Patient progressed to strengthening utilizing a 1 lb weight as well as stretching to increase ROM. Pt continues to have decreased ROM and strength with mild soreness noted.    Body Structure / Function / Physical Skills ADL;Strength;Pain;Edema;UE functional use;IADL;ROM;Scar mobility;FMC;Skin integrity;Muscle spasms    Plan P: mini reassess. Follow up on HEP. Progress strengthening with putty and weights if able.    OT Home Exercise Plan eval:  a/rom forearm, wrist, hand and fine motor coordination. 8/10: HEP updated. Wrist stretches, yellow putty for grip and pinch. Stop previous exercises.    Consulted and Agree with Plan of Care Patient           Patient will benefit from skilled therapeutic intervention in order to improve the following deficits and impairments:   Body Structure / Function / Physical Skills: ADL, Strength, Pain, Edema, UE functional use, IADL, ROM, Scar mobility, FMC, Skin integrity, Muscle spasms       Visit Diagnosis: Pain in right wrist  Stiffness of right wrist, not elsewhere classified  Other symptoms and signs involving the musculoskeletal system  Other lack of coordination    Problem List Patient Active Problem List   Diagnosis Date Noted  . Special screening for malignant neoplasms, colon 06/12/2019  . Postoperative ileus ([place]) 05/20/2018  . Acute hypoxemic respiratory failure ([place]) 05/20/2018  . Acute cholecystitis 05/17/2018  . Persistent atrial fibrillation ([place]) 05/17/2018  . PAF (paroxysmal atrial fibrillation) ([place]) 03/13/2014  . Essential hypertension, benign 02/08/2014   Ailene Ravel, OTR/L,CBIS  [phone]  05/21/2020, 9:02 AM  [place], [place], [address] Phone: [phone]   Fax:  [phone]  Name: GIOVANA FACIANE MRN: [id number] Date of Birth: [birth year]

## 2020-05-21 NOTE — Patient Instructions (Signed)
Access Code: [email or web address] URL: [email or web address] Date: 05/21/2020 Prepared by: Ailene Ravel  Exercises Seated Wrist Extension PROM - 1 x daily - 7 x weekly - 1 sets - 10 reps - 5-10 hold Seated Wrist Flexion PROM Stretch - 1 x daily - 7 x weekly - 1 sets - 10 reps - 5-10 hold Seated Wrist Supination Stretch - 1 x daily - 7 x weekly - 1 sets - 10 reps - 5-10 hold Putty Squeezes - 1 x daily - 7 x weekly (3 minutes) 3-Point Pinch with Putty - 7 x weekly (3 minutes) Key Pinch with Putty - 7 x weekly ( 3 minutes)

## 2020-05-23 ENCOUNTER — Ambulatory Visit (HOSPITAL_COMMUNITY): Payer: Medicare Other

## 2020-05-23 ENCOUNTER — Other Ambulatory Visit: Payer: Self-pay

## 2020-05-23 DIAGNOSIS — M25631 Stiffness of right wrist, not elsewhere classified: Secondary | ICD-10-CM

## 2020-05-23 DIAGNOSIS — M25531 Pain in right wrist: Secondary | ICD-10-CM

## 2020-05-23 DIAGNOSIS — R29898 Other symptoms and signs involving the musculoskeletal system: Secondary | ICD-10-CM

## 2020-05-23 DIAGNOSIS — R278 Other lack of coordination: Secondary | ICD-10-CM

## 2020-05-23 NOTE — Therapy (Addendum)
[place], [place], [address] Phone: [phone]   Fax:  [phone]  Occupational Therapy Treatment 4 week Progress note Patient Details  Name: PATTON SWISHER MRN: [id number] Date of Birth: [birth year] Referring Provider (OT): Dr. Caralyn Guile    Progress Note Reporting Period 04/26/20 to 05/23/20  See note below for Objective Data and Assessment of Progress/Goals.      Encounter Date: 05/23/2020   OT End of Session - 05/23/20 0937    Visit Number 9    Number of Visits 16    Date for OT Re-Evaluation 06/21/20    Authorization Type [employer] Medicare    Progress Note Due on Visit 79    OT Start Time 0815   mini reassess   OT Stop Time 0853    OT Time Calculation (min) 38 min    Activity Tolerance Patient tolerated treatment well    Behavior During Therapy [facility] for tasks assessed/performed           Past Medical History:  Diagnosis Date  . Allergic rhinitis   . Depression   . DJD (degenerative joint disease)   . Essential hypertension   . PAF (paroxysmal atrial fibrillation) ([place])     Past Surgical History:  Procedure Laterality Date  . CHOLECYSTECTOMY N/A 05/18/2018   Procedure: LAPAROSCOPIC CHOLECYSTECTOMY;  Surgeon: Virl Cagey, MD;  Location: [facility];  Service: General;  Laterality: N/A;  . COLONOSCOPY N/A 09/27/2019   Procedure: COLONOSCOPY;  Surgeon: Rogene Houston, MD;  Location: [facility];  Service: Endoscopy;  Laterality: N/A;  730  . DILATION AND CURETTAGE OF UTERUS    . TUBAL LIGATION    . VAGINAL HYSTERECTOMY  08/22/2019    There were no vitals filed for this visit.   Subjective Assessment - 05/23/20 0910    Subjective  S: I worked it yesterday. I water my plants outside with the hose and I think that is really helping me.    Currently in Pain? Yes    Pain Score 1     Pain Location Wrist    Pain Orientation Right    Pain Descriptors / Indicators Sore    Pain Type Acute pain                [facility] OT Assessment - 05/23/20 0819      Assessment   Medical Diagnosis S/P Right Colles Fracture      Precautions   Precautions Other (comment)    Precaution Comments follow IHP - A/ROM through 05/20/20, 8/10 begin P/ROM and strengthening, progress as tolerated       Coordination   9 Hole Peg Test Right    Right 9 Hole Peg Test 17.46"   previous: 21.70"     AROM   Right/Left Forearm Right    Right Forearm Pronation 90 Degrees   previous: 32   Right Forearm Supination 44 Degrees   previous: 20   Right/Left Wrist Right    Right Wrist Extension 50 Degrees   previous: 20   Right Wrist Flexion 40 Degrees   previous: 32   Right Wrist Radial Deviation 22 Degrees   previous: 20   Right Wrist Ulnar Deviation 20 Degrees   previous: 20     Strength   Strength Assessment Site Hand    Right/Left Forearm Right    Right Forearm Pronation 5/5   previous: 4-/5   Right Forearm Supination 5/5   previous: 4-/5   Right/Left  Wrist Right    Right Wrist Flexion 5/5   previous: 4-/5   Right Wrist Extension 5/5   previous: 4-/5   Right Wrist Radial Deviation 5/5   previous: 4-/5   Right Wrist Ulnar Deviation 5/5   previous:4-/5   Right/Left hand Right;Left    Right Hand Grip (lbs) 35   previous: 17   Right Hand Lateral Pinch 14 lbs   previous: 10   Right Hand 3 Point Pinch 14 lbs   previous: 7                   OT Treatments/Exercises (OP) - 05/23/20 0911      Exercises   Exercises Wrist;Elbow      Weighted Stretch Over Towel Roll   Supination - Weighted Stretch 2 pounds;2 minutes   using moist heat during prolonged stretch   Wrist Extension - Weighted Stretch 2 pounds;2 minutes;3 pounds;60 seconds   using moist heat during prolonged.      Modalities   Modalities Moist Heat                    OT Short Term Goals - 05/23/20 0835      OT SHORT TERM GOAL #1   Title Patient will be educated and independent with HEP for improved RUE mobility, strength, and  coordination.    Time 4    Period Weeks    Status Achieved    Target Date 05/24/20      OT SHORT TERM GOAL #2   Title Patient will improve RUE A/ROM to [facility] in order to resume all desired B/IADLs and leisure activites.    Time 4    Period Weeks    Status Achieved      OT SHORT TERM GOAL #3   Title Patient will improve right grip strength by 10 pounds and pinch strength by 2 pounds for improved ability to open water bottles without difficulty.    Time 4    Period Weeks    Status Achieved      OT SHORT TERM GOAL #4   Title Patient will decrease edema by 1.0 cm in right wrist and hand for greater mobility and comfort when using right arm as dominant with daily tasks.    Time 4    Period Weeks    Status Achieved             OT Long Term Goals - 05/23/20 0839      OT LONG TERM GOAL #1   Title Patient will return to PLOF using right hand as dominant with all BIADLS and leisure activities.    Time 8    Period Weeks    Status On-going      OT LONG TERM GOAL #2   Title Patient will improve RUE P/ROM to WNL in order to push self out of bathtub, garden, and comb her hair without difficulty.    Time 8    Period Weeks    Status On-going      OT LONG TERM GOAL #3   Title Patient will improve RUE strength to 5/5 in order to pick up gardening tools and supplies and resume weekly workouts at the rec center.    Time 8    Period Weeks    Status Achieved      OT LONG TERM GOAL #4   Title Patient will improve RUE grip and pinch strength to 80% of LUE or better for improved ability to  open doors, jars, containers.    Time 8    Period Weeks    Status On-going      OT LONG TERM GOAL #5   Title Patient will improve RUE coordination to WNL as evidence by completing nine hole peg test in 20 seconds or less.    Time 8    Period Weeks    Status Achieved      OT LONG TERM GOAL #6   Title Patient will decrease edema by 1.5 cm or more in right wrist and hand for greater mobility and less  pain.    Time 8    Period Weeks    Status Achieved      OT LONG TERM GOAL #7   Title Patient will decrease pain to 1/10 or better in right wrist region when completing gardening tasks.    Status Achieved                 Plan - 05/23/20 0944    Clinical Impression Statement A: Mini reassessment completed this date. Patient has met all STGs and 4/7 LTGs. Wrist and forearm strength is 5/5. Continues to be limited with supination and wrist extension causing difficulty completing daily and leisure tasks. Grip and pinch strength have improved at 46% (grip), 78% (3 point), and 80%(lateral) of the LUE. Complete weighted prolonged stretch using moist heat for supination and wrist extension to work on ROM deficits.    Body Structure / Function / Physical Skills ADL;Strength;Pain;Edema;UE functional use;IADL;ROM;Scar mobility;FMC;Skin integrity;Muscle spasms    Plan P: Complete FOTO. COntinue to work on increasing passive and A/ROM, grip and pinch strength.    Consulted and Agree with Plan of Care Patient           Patient will benefit from skilled therapeutic intervention in order to improve the following deficits and impairments:   Body Structure / Function / Physical Skills: ADL, Strength, Pain, Edema, UE functional use, IADL, ROM, Scar mobility, FMC, Skin integrity, Muscle spasms       Visit Diagnosis: Stiffness of right wrist, not elsewhere classified  Other symptoms and signs involving the musculoskeletal system  Other lack of coordination  Pain in right wrist    Problem List Patient Active Problem List   Diagnosis Date Noted  . Special screening for malignant neoplasms, colon 06/12/2019  . Postoperative ileus ([place]) 05/20/2018  . Acute hypoxemic respiratory failure ([place]) 05/20/2018  . Acute cholecystitis 05/17/2018  . Persistent atrial fibrillation ([place]) 05/17/2018  . PAF (paroxysmal atrial fibrillation) ([place]) 03/13/2014  . Essential hypertension, benign 02/08/2014    Ailene Ravel, OTR/L,CBIS  [phone]  05/23/2020, 10:11 AM  [place], [place], [address] Phone: [phone]   Fax:  [phone]  Name: COLETTA LOCKNER MRN: [id number] Date of Birth: 1946-07-23

## 2020-05-28 ENCOUNTER — Encounter (HOSPITAL_COMMUNITY): Payer: Self-pay

## 2020-05-28 ENCOUNTER — Ambulatory Visit (HOSPITAL_COMMUNITY): Payer: Medicare Other

## 2020-05-28 ENCOUNTER — Other Ambulatory Visit: Payer: Self-pay

## 2020-05-28 DIAGNOSIS — R29898 Other symptoms and signs involving the musculoskeletal system: Secondary | ICD-10-CM

## 2020-05-28 DIAGNOSIS — M25531 Pain in right wrist: Secondary | ICD-10-CM | POA: Diagnosis not present

## 2020-05-28 DIAGNOSIS — M25631 Stiffness of right wrist, not elsewhere classified: Secondary | ICD-10-CM

## 2020-05-28 DIAGNOSIS — R278 Other lack of coordination: Secondary | ICD-10-CM

## 2020-05-28 NOTE — Therapy (Signed)
[place], [place], [address] Phone: [phone]   Fax:  [phone]  Occupational Therapy Treatment  Patient Details  Name: Tara Floyd MRN: [id number] Date of Birth: Jun 11, 1946 Referring Provider (OT): Dr. Caralyn Guile   Encounter Date: 05/28/2020   OT End of Session - 05/28/20 0914    Visit Number 10    Number of Visits 16    Date for OT Re-Evaluation 06/21/20    Authorization Type [employer]HC Medicare    Progress Note Due on Visit 19    OT Start Time 0815    OT Stop Time 0853    OT Time Calculation (min) 38 min    Activity Tolerance Patient tolerated treatment well    Behavior During Therapy [facility] for tasks assessed/performed           Past Medical History:  Diagnosis Date  . Allergic rhinitis   . Depression   . DJD (degenerative joint disease)   . Essential hypertension   . PAF (paroxysmal atrial fibrillation) ([place])     Past Surgical History:  Procedure Laterality Date  . CHOLECYSTECTOMY N/A 05/18/2018   Procedure: LAPAROSCOPIC CHOLECYSTECTOMY;  Surgeon: Virl Cagey, MD;  Location: [facility];  Service: General;  Laterality: N/A;  . COLONOSCOPY N/A 09/27/2019   Procedure: COLONOSCOPY;  Surgeon: Rogene Houston, MD;  Location: [facility];  Service: Endoscopy;  Laterality: N/A;  730  . DILATION AND CURETTAGE OF UTERUS    . TUBAL LIGATION    . VAGINAL HYSTERECTOMY  08/22/2019    There were no vitals filed for this visit.   Subjective Assessment - 05/28/20 0829    Currently in Pain? Yes    Pain Score 1     Pain Location Wrist    Pain Orientation Right    Pain Descriptors / Indicators Sore    Pain Type Acute pain    Pain Radiating Towards N/A    Pain Onset Yesterday    Pain Frequency Constant    Aggravating Factors  working it    Pain Relieving Factors heat during therapy    Effect of Pain on Daily Activities minimal effect    Multiple Pain Sites No              [facility] OT Assessment -  05/28/20 0831      Assessment   Medical Diagnosis S/P Right Colles Fracture      Precautions   Precautions Other (comment)    Precaution Comments follow IHP - A/ROM through 05/20/20, 8/10 begin P/ROM and strengthening, progress as tolerated       Observation/Other Assessments   Focus on Therapeutic Outcomes (FOTO)  73/100                    OT Treatments/Exercises (OP) - 05/28/20 4268      Exercises   Exercises Wrist;Elbow;Hand      Additional Elbow Exercises   Hand Gripper with Large Beads all beads with gripper set at 25#    Hand Gripper with Medium Beads all beads with gripper set at 25#    Hand Gripper with Small Beads all beads with gripper set at 25#      Weighted Stretch Over Towel Roll   Supination - Weighted Stretch 2 pounds;2 minutes   using heat during prolonged stretch   Wrist Extension - Weighted Stretch 3 pounds;2 minutes   using moist heat during prolonged stretch     Wrist Exercises  Other wrist exercises Self wrist stretch: wrist extension (standing at table) 3x10", supination (seated with forearm on table) 3x10"      Additional Wrist Exercises   Sponges blue resistive clothespin utilized with a 3 point pinch to pick up 30 sponges.      Modalities   Modalities Moist Heat                    OT Short Term Goals - 05/28/20 0945      OT SHORT TERM GOAL #1   Title Patient will be educated and independent with HEP for improved RUE mobility, strength, and coordination.    Time 4    Period Weeks    Target Date 05/24/20      OT SHORT TERM GOAL #2   Title Patient will improve RUE A/ROM to [facility] in order to resume all desired B/IADLs and leisure activites.    Time 4    Period Weeks      OT SHORT TERM GOAL #3   Title Patient will improve right grip strength by 10 pounds and pinch strength by 2 pounds for improved ability to open water bottles without difficulty.    Time 4    Period Weeks      OT SHORT TERM GOAL #4   Title Patient will  decrease edema by 1.0 cm in right wrist and hand for greater mobility and comfort when using right arm as dominant with daily tasks.    Time 4    Period Weeks             OT Long Term Goals - 05/28/20 0945      OT LONG TERM GOAL #1   Title Patient will return to PLOF using right hand as dominant with all BIADLS and leisure activities.    Time 8    Period Weeks    Status On-going      OT LONG TERM GOAL #2   Title Patient will improve RUE P/ROM to WNL in order to push self out of bathtub, garden, and comb her hair without difficulty.    Time 8    Period Weeks    Status On-going      OT LONG TERM GOAL #3   Title Patient will improve RUE strength to 5/5 in order to pick up gardening tools and supplies and resume weekly workouts at the rec center.    Time 8    Period Weeks      OT LONG TERM GOAL #4   Title Patient will improve RUE grip and pinch strength to 80% of LUE or better for improved ability to open doors, jars, containers.    Time 8    Period Weeks    Status On-going      OT LONG TERM GOAL #5   Title Patient will improve RUE coordination to WNL as evidence by completing nine hole peg test in 20 seconds or less.    Time 8    Period Weeks      OT LONG TERM GOAL #6   Title Patient will decrease edema by 1.5 cm or more in right wrist and hand for greater mobility and less pain.    Time 8    Period Weeks      OT LONG TERM GOAL #7   Title Patient will decrease pain to 1/10 or better in right wrist region when completing gardening tasks.  Plan - 05/28/20 0923    Clinical Impression Statement A: Focused on wrist ROM during prolonged stretching with heat and hand weight as well as self stretching. Patient provided VC for form and technique during all exercises and stretches. Completed pinch strengthening activity with moderate difficulty due to weakness.    Body Structure / Function / Physical Skills ADL;Strength;Pain;Edema;UE functional  use;IADL;ROM;Scar mobility;FMC;Skin integrity;Muscle spasms    Plan P: Continue with increasing ROM for wrist extension and supination. Increase resistance on gripper.    Consulted and Agree with Plan of Care Patient           Patient will benefit from skilled therapeutic intervention in order to improve the following deficits and impairments:   Body Structure / Function / Physical Skills: ADL, Strength, Pain, Edema, UE functional use, IADL, ROM, Scar mobility, FMC, Skin integrity, Muscle spasms       Visit Diagnosis: Stiffness of right wrist, not elsewhere classified  Other symptoms and signs involving the musculoskeletal system  Other lack of coordination  Pain in right wrist    Problem List Patient Active Problem List   Diagnosis Date Noted  . Special screening for malignant neoplasms, colon 06/12/2019  . Postoperative ileus ([place]) 05/20/2018  . Acute hypoxemic respiratory failure ([place]) 05/20/2018  . Acute cholecystitis 05/17/2018  . Persistent atrial fibrillation ([place]) 05/17/2018  . PAF (paroxysmal atrial fibrillation) ([place]) 03/13/2014  . Essential hypertension, benign 02/08/2014   Ailene Ravel, OTR/L,CBIS  [phone]  05/28/2020, 9:46 AM  [place], [place], [address] Phone: [phone]   Fax:  [phone]  Name: TEMPERENCE ZENOR MRN: [id number] Date of Birth: 05-03-1946

## 2020-05-30 ENCOUNTER — Ambulatory Visit (HOSPITAL_COMMUNITY): Payer: Medicare Other

## 2020-05-30 ENCOUNTER — Other Ambulatory Visit: Payer: Self-pay

## 2020-05-30 ENCOUNTER — Encounter (HOSPITAL_COMMUNITY): Payer: Self-pay

## 2020-05-30 DIAGNOSIS — R29898 Other symptoms and signs involving the musculoskeletal system: Secondary | ICD-10-CM

## 2020-05-30 DIAGNOSIS — M25531 Pain in right wrist: Secondary | ICD-10-CM

## 2020-05-30 DIAGNOSIS — M25631 Stiffness of right wrist, not elsewhere classified: Secondary | ICD-10-CM

## 2020-05-30 DIAGNOSIS — R278 Other lack of coordination: Secondary | ICD-10-CM

## 2020-05-30 NOTE — Therapy (Signed)
[place], [place], [address] Phone: [phone]   Fax:  [phone]  Occupational Therapy Treatment  Patient Details  Name: Tara Floyd MRN: [id number] Date of Birth: [birth year] Referring Provider (OT): Dr. Caralyn Guile   Encounter Date: 05/30/2020   OT End of Session - 05/30/20 0826    Visit Number 11    Number of Visits 16    Date for OT Re-Evaluation 06/21/20    Authorization Type [employer] Medicare    Progress Note Due on Visit 19    OT Start Time 0815    OT Stop Time 0853    OT Time Calculation (min) 38 min    Activity Tolerance Patient tolerated treatment well    Behavior During Therapy [facility] for tasks assessed/performed           Past Medical History:  Diagnosis Date  . Allergic rhinitis   . Depression   . DJD (degenerative joint disease)   . Essential hypertension   . PAF (paroxysmal atrial fibrillation) ([place])     Past Surgical History:  Procedure Laterality Date  . CHOLECYSTECTOMY N/A 05/18/2018   Procedure: LAPAROSCOPIC CHOLECYSTECTOMY;  Surgeon: Virl Cagey, MD;  Location: [facility];  Service: General;  Laterality: N/A;  . COLONOSCOPY N/A 09/27/2019   Procedure: COLONOSCOPY;  Surgeon: Rogene Houston, MD;  Location: [facility];  Service: Endoscopy;  Laterality: N/A;  730  . DILATION AND CURETTAGE OF UTERUS    . TUBAL LIGATION    . VAGINAL HYSTERECTOMY  08/22/2019    There were no vitals filed for this visit.   Subjective Assessment - 05/30/20 0823    Subjective  S: My pointer finger was stiff this morning and this morning I ran it under hot water and it loosened up.    Currently in Pain? No/denies              [facility] OT Assessment - 05/30/20 0824      Assessment   Medical Diagnosis S/P Right Colles Fracture      Precautions   Precautions Other (comment)    Precaution Comments follow IHP - A/ROM through 05/20/20, 8/10 begin P/ROM and strengthening, progress as tolerated                      OT Treatments/Exercises (OP) - 05/30/20 0824      Exercises   Exercises Wrist;Elbow;Hand      Additional Elbow Exercises   Hand Gripper with Large Beads all beads with gripper set at 29#    Hand Gripper with Medium Beads all beads with gripper set at 29#    Hand Gripper with Small Beads all beads with gripper set at 25# and 29#   5 beads completed with 29#     Weighted Stretch Over Towel Roll   Supination - Weighted Stretch 2 pounds;2 minutes   using heat during prolonged stretch   Wrist Extension - Weighted Stretch 3 pounds;2 minutes   using heat during prolonged stretch     Wrist Exercises   Other wrist exercises Self wrist stretch: wrist extension (standing at table) 3x10", supination (seated with forearm on table) 3x10"    Other wrist exercises Resistive clothespins standing while focusing on supination when placing and removing pins from vertical pole.      Modalities   Modalities Moist Heat      Moist Heat Therapy   Number Minutes Moist Heat 5 Minutes    Moist  Heat Location Wrist                    OT Short Term Goals - 05/28/20 0945      OT SHORT TERM GOAL #1   Title Patient will be educated and independent with HEP for improved RUE mobility, strength, and coordination.    Time 4    Period Weeks    Target Date 05/24/20      OT SHORT TERM GOAL #2   Title Patient will improve RUE A/ROM to [facility] in order to resume all desired B/IADLs and leisure activites.    Time 4    Period Weeks      OT SHORT TERM GOAL #3   Title Patient will improve right grip strength by 10 pounds and pinch strength by 2 pounds for improved ability to open water bottles without difficulty.    Time 4    Period Weeks      OT SHORT TERM GOAL #4   Title Patient will decrease edema by 1.0 cm in right wrist and hand for greater mobility and comfort when using right arm as dominant with daily tasks.    Time 4    Period Weeks             OT Long Term Goals -  05/28/20 0945      OT LONG TERM GOAL #1   Title Patient will return to PLOF using right hand as dominant with all BIADLS and leisure activities.    Time 8    Period Weeks    Status On-going      OT LONG TERM GOAL #2   Title Patient will improve RUE P/ROM to WNL in order to push self out of bathtub, garden, and comb her hair without difficulty.    Time 8    Period Weeks    Status On-going      OT LONG TERM GOAL #3   Title Patient will improve RUE strength to 5/5 in order to pick up gardening tools and supplies and resume weekly workouts at the rec center.    Time 8    Period Weeks      OT LONG TERM GOAL #4   Title Patient will improve RUE grip and pinch strength to 80% of LUE or better for improved ability to open doors, jars, containers.    Time 8    Period Weeks    Status On-going      OT LONG TERM GOAL #5   Title Patient will improve RUE coordination to WNL as evidence by completing nine hole peg test in 20 seconds or less.    Time 8    Period Weeks      OT LONG TERM GOAL #6   Title Patient will decrease edema by 1.5 cm or more in right wrist and hand for greater mobility and less pain.    Time 8    Period Weeks      OT LONG TERM GOAL #7   Title Patient will decrease pain to 1/10 or better in right wrist region when completing gardening tasks.                 Plan - 05/30/20 0909    Clinical Impression Statement A: Pt able to complete most all beads with increased gripper strength. Increased difficulty gripping and picking up small beads. Only able to pick up 5 this date.    Body Structure / Function / Physical Skills ADL;Strength;Pain;Edema;UE  functional use;IADL;ROM;Scar mobility;FMC;Skin integrity;Muscle spasms    Plan P: COntinue with increasing wrist extension (use weighted flexion/extension bar). Supination. Continue with grip and pinch.    Consulted and Agree with Plan of Care Patient           Patient will benefit from skilled therapeutic  intervention in order to improve the following deficits and impairments:   Body Structure / Function / Physical Skills: ADL, Strength, Pain, Edema, UE functional use, IADL, ROM, Scar mobility, FMC, Skin integrity, Muscle spasms       Visit Diagnosis: Other lack of coordination  Pain in right wrist  Other symptoms and signs involving the musculoskeletal system  Stiffness of right wrist, not elsewhere classified    Problem List Patient Active Problem List   Diagnosis Date Noted  . Special screening for malignant neoplasms, colon 06/12/2019  . Postoperative ileus ([place]) 05/20/2018  . Acute hypoxemic respiratory failure ([place]) 05/20/2018  . Acute cholecystitis 05/17/2018  . Persistent atrial fibrillation ([place]) 05/17/2018  . PAF (paroxysmal atrial fibrillation) ([place]) 03/13/2014  . Essential hypertension, benign 02/08/2014    Ailene Ravel, OTR/L,CBIS  [phone]  05/30/2020, 9:31 AM  [facility] [address] [place], [place], [address] Phone: [phone]   Fax:  [phone]  Name: Tara Floyd MRN: [id number] Date of Birth: Jan 28, 1946

## 2020-06-04 ENCOUNTER — Encounter (HOSPITAL_COMMUNITY): Payer: Self-pay

## 2020-06-04 ENCOUNTER — Ambulatory Visit (HOSPITAL_COMMUNITY): Payer: Medicare Other

## 2020-06-04 ENCOUNTER — Other Ambulatory Visit: Payer: Self-pay

## 2020-06-04 DIAGNOSIS — M25631 Stiffness of right wrist, not elsewhere classified: Secondary | ICD-10-CM

## 2020-06-04 DIAGNOSIS — M25531 Pain in right wrist: Secondary | ICD-10-CM

## 2020-06-04 DIAGNOSIS — R278 Other lack of coordination: Secondary | ICD-10-CM

## 2020-06-04 DIAGNOSIS — R29898 Other symptoms and signs involving the musculoskeletal system: Secondary | ICD-10-CM

## 2020-06-04 NOTE — Therapy (Signed)
[place], [place], [address] Phone: [phone]   Fax:  [phone]  Occupational Therapy Treatment  Patient Details  Name: Tara Floyd MRN: [id number] Date of Birth: 1946-08-23 Referring Provider (OT): Dr. Caralyn Guile   Encounter Date: 06/04/2020   OT End of Session - 06/04/20 0909    Visit Number 12    Number of Visits 16    Date for OT Re-Evaluation 06/21/20    Authorization Type [employer] Medicare    Progress Note Due on Visit 19    OT Start Time 0818    OT Stop Time 0858    OT Time Calculation (min) 40 min    Activity Tolerance Patient tolerated treatment well    Behavior During Therapy [facility] for tasks assessed/performed           Past Medical History:  Diagnosis Date  . Allergic rhinitis   . Depression   . DJD (degenerative joint disease)   . Essential hypertension   . PAF (paroxysmal atrial fibrillation) ([place])     Past Surgical History:  Procedure Laterality Date  . CHOLECYSTECTOMY N/A 05/18/2018   Procedure: LAPAROSCOPIC CHOLECYSTECTOMY;  Surgeon: Virl Cagey, MD;  Location: [facility];  Service: General;  Laterality: N/A;  . COLONOSCOPY N/A 09/27/2019   Procedure: COLONOSCOPY;  Surgeon: Rogene Houston, MD;  Location: [facility];  Service: Endoscopy;  Laterality: N/A;  730  . DILATION AND CURETTAGE OF UTERUS    . TUBAL LIGATION    . VAGINAL HYSTERECTOMY  08/22/2019    There were no vitals filed for this visit.   Subjective Assessment - 06/04/20 0824    Subjective  S: It was a 3/10 this morning but now that i'm moving it, it has gone down to a 2.    Currently in Pain? Yes    Pain Score 2     Pain Location Wrist    Pain Orientation Right    Pain Descriptors / Indicators Sore    Pain Type Acute pain    Pain Radiating Towards N/A    Pain Onset Today    Pain Frequency Constant    Aggravating Factors  just sleeping and being stiff. Working on the garden this weekend.    Pain Relieving  Factors heat during therapy.    Effect of Pain on Daily Activities min effect              [facility] OT Assessment - 06/04/20 0827      Assessment   Medical Diagnosis S/P Right Colles Fracture      Precautions   Precautions Other (comment)    Precaution Comments follow IHP - A/ROM through 05/20/20, 8/10 begin P/ROM and strengthening, progress as tolerated                     OT Treatments/Exercises (OP) - 06/04/20 0827      Exercises   Exercises Wrist;Elbow;Hand;Theraputty      Wrist Exercises   Other wrist exercises Self wrist stretch: wrist extension (standing at table) 3x20", supination (seated with forearm on table) 3x20"    Other wrist exercises Resisted wrist extension completed 3X with 2lb weight       Additional Wrist Exercises   Sponges blue resistive clothespin utilized with a 3 point pinch to pick up 30 sponges.      Hand Exercises   Other Hand Exercises PVC pipe circles made in red putty while focusing on wrist flexion/extension strength  and hand strengthening. Marland Kitchen Pt then completed 2 point pinch on all circles.       Modalities   Modalities Moist Heat      Moist Heat Therapy   Number Minutes Moist Heat 5 Minutes    Moist Heat Location Wrist                    OT Short Term Goals - 05/28/20 0945      OT SHORT TERM GOAL #1   Title Patient will be educated and independent with HEP for improved RUE mobility, strength, and coordination.    Time 4    Period Weeks    Target Date 05/24/20      OT SHORT TERM GOAL #2   Title Patient will improve RUE A/ROM to [facility] in order to resume all desired B/IADLs and leisure activites.    Time 4    Period Weeks      OT SHORT TERM GOAL #3   Title Patient will improve right grip strength by 10 pounds and pinch strength by 2 pounds for improved ability to open water bottles without difficulty.    Time 4    Period Weeks      OT SHORT TERM GOAL #4   Title Patient will decrease edema by 1.0 cm in right wrist  and hand for greater mobility and comfort when using right arm as dominant with daily tasks.    Time 4    Period Weeks             OT Long Term Goals - 05/28/20 0945      OT LONG TERM GOAL #1   Title Patient will return to PLOF using right hand as dominant with all BIADLS and leisure activities.    Time 8    Period Weeks    Status On-going      OT LONG TERM GOAL #2   Title Patient will improve RUE P/ROM to WNL in order to push self out of bathtub, garden, and comb her hair without difficulty.    Time 8    Period Weeks    Status On-going      OT LONG TERM GOAL #3   Title Patient will improve RUE strength to 5/5 in order to pick up gardening tools and supplies and resume weekly workouts at the rec center.    Time 8    Period Weeks      OT LONG TERM GOAL #4   Title Patient will improve RUE grip and pinch strength to 80% of LUE or better for improved ability to open doors, jars, containers.    Time 8    Period Weeks    Status On-going      OT LONG TERM GOAL #5   Title Patient will improve RUE coordination to WNL as evidence by completing nine hole peg test in 20 seconds or less.    Time 8    Period Weeks      OT LONG TERM GOAL #6   Title Patient will decrease edema by 1.5 cm or more in right wrist and hand for greater mobility and less pain.    Time 8    Period Weeks      OT LONG TERM GOAL #7   Title Patient will decrease pain to 1/10 or better in right wrist region when completing gardening tasks.                 Plan - 06/04/20 9798  Clinical Impression Statement A: Pt able to increase wrighted supination stretch to 3# this date. Due to hand fatigue was unable to pick up last two sponges at end of session. VC for form and technique.    Body Structure / Function / Physical Skills ADL;Strength;Pain;Edema;UE functional use;IADL;ROM;Scar mobility;FMC;Skin integrity;Muscle spasms    Plan P: COntinue with increasing wrist extension and supination while increasing  grip and pinch strength.    Consulted and Agree with Plan of Care Patient           Patient will benefit from skilled therapeutic intervention in order to improve the following deficits and impairments:   Body Structure / Function / Physical Skills: ADL, Strength, Pain, Edema, UE functional use, IADL, ROM, Scar mobility, FMC, Skin integrity, Muscle spasms       Visit Diagnosis: Pain in right wrist  Other symptoms and signs involving the musculoskeletal system  Stiffness of right wrist, not elsewhere classified  Other lack of coordination    Problem List Patient Active Problem List   Diagnosis Date Noted  . Special screening for malignant neoplasms, colon 06/12/2019  . Postoperative ileus ([place]) 05/20/2018  . Acute hypoxemic respiratory failure ([place]) 05/20/2018  . Acute cholecystitis 05/17/2018  . Persistent atrial fibrillation ([place]) 05/17/2018  . PAF (paroxysmal atrial fibrillation) ([place]) 03/13/2014  . Essential hypertension, benign 02/08/2014    Ailene Ravel, OTR/L,CBIS  [phone]  06/04/2020, 9:20 AM  [place] [facility] [address] [place], [place], [address] Phone: [phone]   Fax:  [phone]  Name: Tara Floyd MRN: [id number] Date of Birth: 04-25-1946

## 2020-06-06 ENCOUNTER — Encounter (HOSPITAL_COMMUNITY): Payer: Self-pay

## 2020-06-06 ENCOUNTER — Ambulatory Visit (HOSPITAL_COMMUNITY): Payer: Medicare Other

## 2020-06-06 ENCOUNTER — Other Ambulatory Visit: Payer: Self-pay

## 2020-06-06 DIAGNOSIS — R278 Other lack of coordination: Secondary | ICD-10-CM

## 2020-06-06 DIAGNOSIS — M25631 Stiffness of right wrist, not elsewhere classified: Secondary | ICD-10-CM

## 2020-06-06 DIAGNOSIS — M25531 Pain in right wrist: Secondary | ICD-10-CM

## 2020-06-06 DIAGNOSIS — R29898 Other symptoms and signs involving the musculoskeletal system: Secondary | ICD-10-CM

## 2020-06-06 NOTE — Therapy (Signed)
[place], [place], [address] Phone: [phone]   Fax:  [phone]  Occupational Therapy Treatment  Patient Details  Name: Tara Floyd MRN: [id number] Date of Birth: 25-Feb-1946 Referring Provider (OT): Dr. Caralyn Guile   Encounter Date: 06/06/2020   OT End of Session - 06/06/20 1024    Visit Number 13    Number of Visits 16    Date for OT Re-Evaluation 06/21/20    Authorization Type [employer] Medicare    Progress Note Due on Visit 36    OT Start Time 0950    OT Stop Time 1028    OT Time Calculation (min) 38 min    Activity Tolerance Patient tolerated treatment well    Behavior During Therapy [facility] for tasks assessed/performed           Past Medical History:  Diagnosis Date  . Allergic rhinitis   . Depression   . DJD (degenerative joint disease)   . Essential hypertension   . PAF (paroxysmal atrial fibrillation) ([place])     Past Surgical History:  Procedure Laterality Date  . CHOLECYSTECTOMY N/A 05/18/2018   Procedure: LAPAROSCOPIC CHOLECYSTECTOMY;  Surgeon: Virl Cagey, MD;  Location: [facility];  Service: General;  Laterality: N/A;  . COLONOSCOPY N/A 09/27/2019   Procedure: COLONOSCOPY;  Surgeon: Rogene Houston, MD;  Location: [facility];  Service: Endoscopy;  Laterality: N/A;  730  . DILATION AND CURETTAGE OF UTERUS    . TUBAL LIGATION    . VAGINAL HYSTERECTOMY  08/22/2019    There were no vitals filed for this visit.   Subjective Assessment - 06/06/20 0956    Subjective  S: No pain this morning. My pointer finger was a little stiff but I moved and it feels much better.    Currently in Pain? No/denies              [facility] OT Assessment - 06/06/20 0957      Assessment   Medical Diagnosis S/P Right Colles Fracture      Precautions   Precautions Other (comment)    Precaution Comments follow IHP - A/ROM through 05/20/20, 8/10 begin P/ROM and strengthening, progress as tolerated                      OT Treatments/Exercises (OP) - 06/06/20 0957      Exercises   Exercises Wrist;Elbow;Hand;Theraputty      Additional Elbow Exercises   Hand Gripper with Large Beads all beads with gripper set at 29#    Hand Gripper with Medium Beads all beads with gripper set at 29#    Hand Gripper with Small Beads all beads with gripper set at 29#   horizontal     Weighted Stretch Over Towel Roll   Supination - Weighted Stretch 2 pounds;2 minutes   using moist heat during stretch   Wrist Extension - Weighted Stretch 3 pounds;2 minutes   using heat during stretch     Wrist Exercises   Other wrist exercises Self wrist stretch: wrist extension (standing at table) 2x30", supination (seated with forearm on table) 2x30"    Other wrist exercises Resisted wrist extension and then flexion completed using 3lb weight on bar. 3 minutes completed for both.      Modalities   Modalities Moist Heat      Moist Heat Therapy   Number Minutes Moist Heat 5 Minutes    Moist Heat Location Wrist  OT Short Term Goals - 05/28/20 0945      OT SHORT TERM GOAL #1   Title Patient will be educated and independent with HEP for improved RUE mobility, strength, and coordination.    Time 4    Period Weeks    Target Date 05/24/20      OT SHORT TERM GOAL #2   Title Patient will improve RUE A/ROM to [facility] in order to resume all desired B/IADLs and leisure activites.    Time 4    Period Weeks      OT SHORT TERM GOAL #3   Title Patient will improve right grip strength by 10 pounds and pinch strength by 2 pounds for improved ability to open water bottles without difficulty.    Time 4    Period Weeks      OT SHORT TERM GOAL #4   Title Patient will decrease edema by 1.0 cm in right wrist and hand for greater mobility and comfort when using right arm as dominant with daily tasks.    Time 4    Period Weeks             OT Long Term Goals - 05/28/20 0945      OT LONG TERM  GOAL #1   Title Patient will return to PLOF using right hand as dominant with all BIADLS and leisure activities.    Time 8    Period Weeks    Status On-going      OT LONG TERM GOAL #2   Title Patient will improve RUE P/ROM to WNL in order to push self out of bathtub, garden, and comb her hair without difficulty.    Time 8    Period Weeks    Status On-going      OT LONG TERM GOAL #3   Title Patient will improve RUE strength to 5/5 in order to pick up gardening tools and supplies and resume weekly workouts at the rec center.    Time 8    Period Weeks      OT LONG TERM GOAL #4   Title Patient will improve RUE grip and pinch strength to 80% of LUE or better for improved ability to open doors, jars, containers.    Time 8    Period Weeks    Status On-going      OT LONG TERM GOAL #5   Title Patient will improve RUE coordination to WNL as evidence by completing nine hole peg test in 20 seconds or less.    Time 8    Period Weeks      OT LONG TERM GOAL #6   Title Patient will decrease edema by 1.5 cm or more in right wrist and hand for greater mobility and less pain.    Time 8    Period Weeks      OT LONG TERM GOAL #7   Title Patient will decrease pain to 1/10 or better in right wrist region when completing gardening tasks.                 Plan - 06/06/20 1225    Clinical Impression Statement A: Focused session on stretching prior to grip and wrist strengthening. Patient was able to complete gripper task wih 29# and pick up all bead sizes with min difficulty. VC for form and technique were provided as needed.    Body Structure / Function / Physical Skills ADL;Strength;Pain;Edema;UE functional use;IADL;ROM;Scar mobility;FMC;Skin integrity;Muscle spasms    Plan P: Increase handgripper  to 35#    Consulted and Agree with Plan of Care Patient           Patient will benefit from skilled therapeutic intervention in order to improve the following deficits and impairments:   Body  Structure / Function / Physical Skills: ADL, Strength, Pain, Edema, UE functional use, IADL, ROM, Scar mobility, FMC, Skin integrity, Muscle spasms       Visit Diagnosis: Other symptoms and signs involving the musculoskeletal system  Pain in right wrist  Stiffness of right wrist, not elsewhere classified  Other lack of coordination    Problem List Patient Active Problem List   Diagnosis Date Noted  . Special screening for malignant neoplasms, colon 06/12/2019  . Postoperative ileus ([place]) 05/20/2018  . Acute hypoxemic respiratory failure ([place]) 05/20/2018  . Acute cholecystitis 05/17/2018  . Persistent atrial fibrillation ([place]) 05/17/2018  . PAF (paroxysmal atrial fibrillation) ([place]) 03/13/2014  . Essential hypertension, benign 02/08/2014   Ailene Ravel, OTR/L,CBIS  [phone]  06/06/2020, 12:30 PM  [facility] [address] [place], [place], [address] Phone: [phone]   Fax:  [phone]  Name: Tara Floyd MRN: [id number] Date of Birth: 1945-10-15

## 2020-06-11 ENCOUNTER — Encounter (HOSPITAL_COMMUNITY): Payer: Self-pay

## 2020-06-11 ENCOUNTER — Other Ambulatory Visit: Payer: Self-pay

## 2020-06-11 ENCOUNTER — Ambulatory Visit (HOSPITAL_COMMUNITY): Payer: Medicare Other

## 2020-06-11 DIAGNOSIS — M25631 Stiffness of right wrist, not elsewhere classified: Secondary | ICD-10-CM

## 2020-06-11 DIAGNOSIS — M25531 Pain in right wrist: Secondary | ICD-10-CM

## 2020-06-11 NOTE — Therapy (Addendum)
[place], [place], [address] Phone: [phone]   Fax:  [phone]  Occupational Therapy Treatment  Patient Details  Name: Tara Floyd MRN: [id number] Date of Birth: 1946-05-09 Referring Provider (OT): Dr. Caralyn Guile   Encounter Date: 06/11/2020   OT End of Session - 06/11/20 0857    Visit Number 14    Number of Visits 16    Date for OT Re-Evaluation 06/21/20    Authorization Type U[employer] Medicare    Progress Note Due on Visit 55    OT Start Time 0815    OT Stop Time 0856    OT Time Calculation (min) 41 min    Activity Tolerance Patient tolerated treatment well    Behavior During Therapy [facility] for tasks assessed/performed           Past Medical History:  Diagnosis Date  . Allergic rhinitis   . Depression   . DJD (degenerative joint disease)   . Essential hypertension   . PAF (paroxysmal atrial fibrillation) ([place])     Past Surgical History:  Procedure Laterality Date  . CHOLECYSTECTOMY N/A 05/18/2018   Procedure: LAPAROSCOPIC CHOLECYSTECTOMY;  Surgeon: Virl Cagey, MD;  Location: [facility];  Service: General;  Laterality: N/A;  . COLONOSCOPY N/A 09/27/2019   Procedure: COLONOSCOPY;  Surgeon: Rogene Houston, MD;  Location: [facility];  Service: Endoscopy;  Laterality: N/A;  730  . DILATION AND CURETTAGE OF UTERUS    . TUBAL LIGATION    . VAGINAL HYSTERECTOMY  08/22/2019    There were no vitals filed for this visit.   Subjective Assessment - 06/11/20 0825    Subjective  S: No pain right now, I can't complain today    Pain Score 0-No pain              [facility] OT Assessment - 06/11/20 0001      Assessment   Medical Diagnosis S/P Right Colles Fracture      Precautions   Precautions Other (comment)    Precaution Comments follow IHP - A/ROM through 05/20/20, 8/10 begin P/ROM and strengthening, progress as tolerated                     OT Treatments/Exercises (OP) - 06/11/20 0829       Exercises   Exercises Wrist;Elbow;Hand;Theraputty      Additional Elbow Exercises   Hand Gripper with Large Beads all beads with gripper set at 37#    Hand Gripper with Medium Beads all beads with gripper set at 37#    Hand Gripper with Small Beads all beads with gripper set at 35#      Weighted Stretch Over Towel Roll   Supination - Weighted Stretch 3 pounds   3' completed with moist heat for prolonged stretch   Wrist Extension - Weighted Stretch 3 pounds   3' with moist heat for prolonged stretch     Wrist Exercises   Other wrist exercises Self wrist stretch: Prayer stretch 2x30", supination 10x10"       Hand Exercises   Other Hand Exercises PVC pipe circles made in red putty while focusing on wrist flexion/extension strength and hand strengthening. Pt then completed 2 point pinch on all circles.       Modalities   Modalities Moist Heat      Moist Heat Therapy   Number Minutes Moist Heat 6 Minutes    Moist Heat Location Wrist  OT Education - 06/11/20 0855    Education Details Added prayer stretch to HEP    Person(s) Educated Patient    Methods Explanation;Demonstration;Verbal cues;Handout    Comprehension Verbalized understanding;Returned demonstration            OT Short Term Goals - 05/28/20 0945      OT SHORT TERM GOAL #1   Title Patient will be educated and independent with HEP for improved RUE mobility, strength, and coordination.    Time 4    Period Weeks    Target Date 05/24/20      OT SHORT TERM GOAL #2   Title Patient will improve RUE A/ROM to [facility] in order to resume all desired B/IADLs and leisure activites.    Time 4    Period Weeks      OT SHORT TERM GOAL #3   Title Patient will improve right grip strength by 10 pounds and pinch strength by 2 pounds for improved ability to open water bottles without difficulty.    Time 4    Period Weeks      OT SHORT TERM GOAL #4   Title Patient will decrease edema by 1.0 cm in right  wrist and hand for greater mobility and comfort when using right arm as dominant with daily tasks.    Time 4    Period Weeks             OT Long Term Goals - 05/28/20 0945      OT LONG TERM GOAL #1   Title Patient will return to PLOF using right hand as dominant with all BIADLS and leisure activities.    Time 8    Period Weeks    Status On-going      OT LONG TERM GOAL #2   Title Patient will improve RUE P/ROM to WNL in order to push self out of bathtub, garden, and comb her hair without difficulty.    Time 8    Period Weeks    Status On-going      OT LONG TERM GOAL #3   Title Patient will improve RUE strength to 5/5 in order to pick up gardening tools and supplies and resume weekly workouts at the rec center.    Time 8    Period Weeks      OT LONG TERM GOAL #4   Title Patient will improve RUE grip and pinch strength to 80% of LUE or better for improved ability to open doors, jars, containers.    Time 8    Period Weeks    Status On-going      OT LONG TERM GOAL #5   Title Patient will improve RUE coordination to WNL as evidence by completing nine hole peg test in 20 seconds or less.    Time 8    Period Weeks      OT LONG TERM GOAL #6   Title Patient will decrease edema by 1.5 cm or more in right wrist and hand for greater mobility and less pain.    Time 8    Period Weeks      OT LONG TERM GOAL #7   Title Patient will decrease pain to 1/10 or better in right wrist region when completing gardening tasks.                 Plan - 06/11/20 0900    Clinical Impression Statement A: Pt tolerated prolonged stretch for a minute longer, with no complaints of pain. Pt completed  prayer stretch with good return demonstration with verbal cues required for positioning and form. Handout provided for new HEP. Pt progressed to using hand gripper on 35# and 37# to pick up different sized beads.    Body Structure / Function / Physical Skills ADL;Strength;Pain;Edema;UE functional  use;IADL;ROM;Scar mobility;FMC;Skin integrity;Muscle spasms    Plan P: Continue working on grip strength, possible reassessment to determine discharge    OT Home Exercise Plan eval:  a/rom forearm, wrist, hand and fine motor coordination. 8/10: HEP updated. Wrist stretches, yellow putty for grip and pinch. Stop previous exercises. 8/31: Prayer stretch added to HEP    Consulted and Agree with Plan of Care Patient           Patient will benefit from skilled therapeutic intervention in order to improve the following deficits and impairments:   Body Structure / Function / Physical Skills: ADL, Strength, Pain, Edema, UE functional use, IADL, ROM, Scar mobility, FMC, Skin integrity, Muscle spasms       Visit Diagnosis: Stiffness of right wrist, not elsewhere classified  Pain in right wrist    Problem List Patient Active Problem List   Diagnosis Date Noted  . Special screening for malignant neoplasms, colon 06/12/2019  . Postoperative ileus ([place]) 05/20/2018  . Acute hypoxemic respiratory failure ([place]) 05/20/2018  . Acute cholecystitis 05/17/2018  . Persistent atrial fibrillation ([place]) 05/17/2018  . PAF (paroxysmal atrial fibrillation) ([place]) 03/13/2014  . Essential hypertension, benign 02/08/2014    [place], [place] Student [phone]   06/11/2020, 12:05 PM  [place], [place], [address] Phone: [phone]   Fax:  [phone]  Name: ARLYS SCATENA MRN: [id number] Date of Birth: [birth year]

## 2020-06-11 NOTE — Patient Instructions (Signed)
Prayer Stretch  With the palms together and the thumbs crossed, slowly spread your elbows apart along the table top. Hold for 30seconds to 1 minutes. Complete 2 times.

## 2020-06-13 ENCOUNTER — Encounter (HOSPITAL_COMMUNITY): Payer: Medicare Other

## 2020-06-18 ENCOUNTER — Encounter (HOSPITAL_COMMUNITY): Payer: Self-pay

## 2020-06-18 ENCOUNTER — Other Ambulatory Visit: Payer: Self-pay

## 2020-06-18 ENCOUNTER — Ambulatory Visit (HOSPITAL_COMMUNITY): Payer: Medicare Other | Attending: Orthopedic Surgery

## 2020-06-18 DIAGNOSIS — R278 Other lack of coordination: Secondary | ICD-10-CM | POA: Diagnosis present

## 2020-06-18 DIAGNOSIS — M25631 Stiffness of right wrist, not elsewhere classified: Secondary | ICD-10-CM | POA: Diagnosis present

## 2020-06-18 DIAGNOSIS — M25531 Pain in right wrist: Secondary | ICD-10-CM | POA: Diagnosis present

## 2020-06-18 DIAGNOSIS — R29898 Other symptoms and signs involving the musculoskeletal system: Secondary | ICD-10-CM | POA: Diagnosis present

## 2020-06-18 NOTE — Therapy (Addendum)
[place], [place], [address] Phone: [phone]   Fax:  [phone]  Occupational Therapy Treatment  Reassessment/discharge summary Patient Details  Name: Tara Floyd MRN: [id number] Date of Birth: 1945-10-31 Referring Provider (OT): Dr. Caralyn Guile   Encounter Date: 06/18/2020   OT End of Session - 06/18/20 1010    Visit Number 15    Number of Visits 16    Authorization Type UHC Medicare    Progress Note Due on Visit 19    OT Start Time 0817    OT Stop Time 0857   Reassess/discharge   OT Time Calculation (min) 40 min    Activity Tolerance Patient tolerated treatment well    Behavior During Therapy [facility] for tasks assessed/performed           Past Medical History:  Diagnosis Date  . Allergic rhinitis   . Depression   . DJD (degenerative joint disease)   . Essential hypertension   . PAF (paroxysmal atrial fibrillation) ([place])     Past Surgical History:  Procedure Laterality Date  . CHOLECYSTECTOMY N/A 05/18/2018   Procedure: LAPAROSCOPIC CHOLECYSTECTOMY;  Surgeon: Virl Cagey, MD;  Location: [facility];  Service: General;  Laterality: N/A;  . COLONOSCOPY N/A 09/27/2019   Procedure: COLONOSCOPY;  Surgeon: Rogene Houston, MD;  Location[facility];  Service: Endoscopy;  Laterality: N/A;  730  . DILATION AND CURETTAGE OF UTERUS    . TUBAL LIGATION    . VAGINAL HYSTERECTOMY  08/22/2019    There were no vitals filed for this visit.   Subjective Assessment - 06/18/20 0859    Subjective  S: I can do so much now that I haven't been able to do for a long time, thank you!    Currently in Pain? No/denies              [facility] OT Assessment - 06/18/20 0823      Assessment   Medical Diagnosis S/P Right Colles Fracture      Precautions   Precautions Other (comment)    Precaution Comments  A/ROM through 05/20/20, 8/10 begin P/ROM and strengthening, progress as tolerated       Observation/Other  Assessments   Focus on Therapeutic Outcomes (FOTO)  98/100   previous 73/100     ROM / Strength   AROM / PROM / Strength AROM;PROM;Strength      AROM   AROM Assessment Site Forearm;Wrist    Right/Left Forearm Right    Right Forearm Supination 70 Degrees   previous 20   Right Wrist Extension 44 Degrees   previous 50   Right Wrist Flexion 42 Degrees   previous 40     PROM   PROM Assessment Site Wrist;Forearm    Right/Left Forearm Right    Right Forearm Supination 90 Degrees   P/ROM not assessed prior to this date   Right/Left Wrist Right    Right Wrist Extension 58 Degrees   P/ROM not assessed prior to this date   Right Wrist Flexion 50 Degrees   P/ROM not assessed prior to this date     Strength   Strength Assessment Site Hand    Right/Left hand Right    Right Hand Grip (lbs) 45   previous 35   Right Hand Lateral Pinch 18 lbs   previous 14   Right Hand 3 Point Pinch 14 lbs   previous 14  OT Education - 06/18/20 1009    Education Details Reviewed goals and progress in therapy. Discussed continuing HEP upon discharge to further promote grip/pinch strength and available ROM    Person(s) Educated Patient    Methods Explanation    Comprehension Verbalized understanding            OT Short Term Goals - 05/28/20 0945      OT SHORT TERM GOAL #1   Title Patient will be educated and independent with HEP for improved RUE mobility, strength, and coordination.    Time 4    Period Weeks    Target Date 05/24/20      OT SHORT TERM GOAL #2   Title Patient will improve RUE A/ROM to [facility] in order to resume all desired B/IADLs and leisure activites.    Time 4    Period Weeks      OT SHORT TERM GOAL #3   Title Patient will improve right grip strength by 10 pounds and pinch strength by 2 pounds for improved ability to open water bottles without difficulty.    Time 4    Period Weeks      OT SHORT TERM GOAL #4   Title Patient will decrease  edema by 1.0 cm in right wrist and hand for greater mobility and comfort when using right arm as dominant with daily tasks.    Time 4    Period Weeks             OT Long Term Goals - 06/18/20 0844      OT LONG TERM GOAL #1   Title Patient will return to PLOF using right hand as dominant with all BIADLS and leisure activities.    Time 8    Period Weeks    Status Achieved      OT LONG TERM GOAL #2   Title Patient will improve RUE P/ROM to WNL in order to push self out of bathtub, garden, and comb her hair without difficulty.    Time 8    Period Weeks    Status Achieved      OT LONG TERM GOAL #3   Title Patient will improve RUE strength to 5/5 in order to pick up gardening tools and supplies and resume weekly workouts at the rec center.    Time 8    Period Weeks      OT LONG TERM GOAL #4   Title Patient will improve RUE grip and pinch strength to 80% of LUE or better for improved ability to open doors, jars, containers.    Time 8    Period Weeks    Status Achieved      OT LONG TERM GOAL #5   Title Patient will improve RUE coordination to WNL as evidence by completing nine hole peg test in 20 seconds or less.    Time 8    Period Weeks      OT LONG TERM GOAL #6   Title Patient will decrease edema by 1.5 cm or more in right wrist and hand for greater mobility and less pain.    Time 8    Period Weeks      OT LONG TERM GOAL #7   Title Patient will decrease pain to 1/10 or better in right wrist region when completing gardening tasks.                 Plan - 06/18/20 1011    Clinical Impression Statement A: Patient reassessed, met  7/7 LTGs with improvements demonstrated in grip and lateral pinch strength, FOTO 98% score, and is confident with completing ADLs and IADLs at PLOF. Patient ready for discharge, however advised to continue HEP for further promotion of strength and ROM in RUE.    Body Structure / Function / Physical Skills ADL;Strength;Pain;Edema;UE functional  use;IADL;ROM;Scar mobility;FMC;Skin integrity;Muscle spasms    Plan P: discharge from skilled OT services with HEP    Consulted and Agree with Plan of Care Patient           Patient will benefit from skilled therapeutic intervention in order to improve the following deficits and impairments:   Body Structure / Function / Physical Skills: ADL, Strength, Pain, Edema, UE functional use, IADL, ROM, Scar mobility, FMC, Skin integrity, Muscle spasms       Visit Diagnosis: Stiffness of right wrist, not elsewhere classified  Pain in right wrist  Other symptoms and signs involving the musculoskeletal system  Other lack of coordination    Problem List Patient Active Problem List   Diagnosis Date Noted  . Special screening for malignant neoplasms, colon 06/12/2019  . Postoperative ileus ([place]) 05/20/2018  . Acute hypoxemic respiratory failure ([place]) 05/20/2018  . Acute cholecystitis 05/17/2018  . Persistent atrial fibrillation ([place]) 05/17/2018  . PAF (paroxysmal atrial fibrillation) ([place]) 03/13/2014  . Essential hypertension, benign 02/08/2014    OCCUPATIONAL THERAPY DISCHARGE SUMMARY  Visits from Start of Care: 15  Current functional level related to goals / functional outcomes: See above   Remaining deficits: Slight limitation still noted with A/ROM wrist flexion/extension and forearm supination.    Education / Equipment: HEP includes: Green theraputty for grip/pinch strength. Wrist and forearm stretches Plan: Patient agrees to discharge.  Patient goals were met. Patient is being discharged due to meeting the stated rehab goals.  ?????         [place], [place] Student [phone]   06/18/2020, 12:40 PM  [place], [place], [address] Phone: [phone]   Fax:  [phone]  Name: Tara Floyd MRN: [id number] Date of Birth: 14-May-1946

## 2020-06-20 ENCOUNTER — Encounter (HOSPITAL_COMMUNITY): Payer: Medicare Other

## 2021-01-03 ENCOUNTER — Other Ambulatory Visit: Payer: Self-pay | Admitting: Cardiology

## 2021-02-18 ENCOUNTER — Encounter: Payer: Self-pay | Admitting: Cardiology

## 2021-07-21 ENCOUNTER — Other Ambulatory Visit: Payer: Self-pay | Admitting: Cardiology

## 2021-07-22 NOTE — Progress Notes (Signed)
Cardiology Office Note  Date: 07/23/2021   ID: Tara Floyd, DOB January 03, 1946, MRN [id number]  PCP:  Adaline Sill, NP  Cardiologist:  Rozann Lesches, MD Electrophysiologist:  None   Chief Complaint  Patient presents with   Cardiac follow-up    History of Present Illness: Tara Floyd is a 75 y.o. female last seen in July 2021 by Mr. Leonides Sake NP.  She is here for a routine visit.  States that she has been feeling well overall.  Had a good summer, race to garden and also did some canning.  She also goes to a local fitness center 4 days a week.  She does not report any sense of palpitations, no exertional chest pain.  I reviewed her medications.  Since I last saw her, she did decide to start on DOAC, currently taking Eliquis 2.5 mg twice daily per PCP.  I did talk with her about ideal dose being 5 mg twice daily based on her age and weight, she is mostly concerned about bleeding risk and also cost at this point.  We are requesting her interval lab work.  I personally reviewed her ECG which shows atrial fibrillation with leftward axis and decreased R wave progression.   Past Medical History:  Diagnosis Date   Allergic rhinitis    Depression    DJD (degenerative joint disease)    Essential hypertension    PAF (paroxysmal atrial fibrillation) ([place])     Past Surgical History:  Procedure Laterality Date   CHOLECYSTECTOMY N/A 05/18/2018   Procedure: LAPAROSCOPIC CHOLECYSTECTOMY;  Surgeon: Virl Cagey, MD;  Location: [facility];  Service: General;  Laterality: N/A;   COLONOSCOPY N/A 09/27/2019   Procedure: COLONOSCOPY;  Surgeon: Rogene Houston, MD;  Location: [facility];  Service: Endoscopy;  Laterality: N/A;  730   DILATION AND CURETTAGE OF UTERUS     TUBAL LIGATION     VAGINAL HYSTERECTOMY  08/22/2019    Current Outpatient Medications  Medication Sig Dispense Refill   ALPRAZolam (XANAX) 0.5 MG tablet Take 0.5 mg by mouth at bedtime as needed for anxiety.      apixaban  (ELIQUIS) 2.5 MG TABS tablet Take 2.5 mg by mouth 2 (two) times daily.     benazepril (LOTENSIN) 10 MG tablet Take 10 mg by mouth daily.     diclofenac sodium (VOLTAREN) 1 % GEL Apply 1 application topically 2 (two) times daily as needed (pain).      diltiazem (CARDIZEM CD) 120 MG 24 hr capsule Take 1 capsule (120 mg total) by mouth daily. 90 capsule 3   fluticasone (FLONASE) 50 MCG/ACT nasal spray Place 2 sprays into both nostrils daily as needed for allergies.      Glycerin-Hypromellose-PEG 400 (DRY EYE RELIEF DROPS) 0.2-0.2-1 % SOLN Place 1 drop into both eyes daily as needed (Dry eye).     ibuprofen (ADVIL,MOTRIN) 200 MG tablet Take 800 mg by mouth every 6 (six) hours as needed for headache, mild pain or moderate pain.      montelukast (SINGULAIR) 10 MG tablet Take 10 mg by mouth daily as needed (allergies). Alternate with Loratadine     No current facility-administered medications for this visit.   Allergies:  Codeine phosphate   ROS: No orthopnea or PND.  Physical Exam: VS:  BP 138/88   Pulse 82   Ht 5\' 2"  (1.575 m)   Wt 170 lb 3.2 oz (77.2 kg)   SpO2 97%   BMI 31.13 kg/m , BMI  Body mass index is 31.13 kg/m.  Wt Readings from Last 3 Encounters:  07/23/21 170 lb 3.2 oz (77.2 kg)  04/29/20 170 lb (77.1 kg)  03/20/20 169 lb (76.7 kg)    General: Patient appears comfortable at rest. HEENT: Conjunctiva and lids normal, wearing a mask. Neck: Supple, no elevated JVP or carotid bruits, no thyromegaly. Lungs: Clear to auscultation, nonlabored breathing at rest. Cardiac: Irregularly irregular, no S3, 1/6 systolic murmur, no pericardial rub. Extremities: No pitting edema.  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  Nonspecific T wave changes.  Recent Labwork:  No interval lab work for review today.  Other Studies Reviewed Today:  Echocardiogram  02/22/2014: Study Conclusions  - Left ventricle: The cavity size was normal. Wall thickness was   increased in a pattern of mild LVH. Systolic function was normal.   The estimated ejection fraction was in the range of 60% to 65%.   Wall motion was normal; there were no regional wall motion   abnormalities. There was an increased relative contribution of   atrial contraction to ventricular filling. Doppler parameters are   consistent with abnormal left ventricular relaxation (grade 1   diastolic dysfunction). - Aortic valve: Trileaflet; mildly thickened leaflets. There was no   stenosis. - Mitral valve: Mildly thickened leaflets . Mild regurgitation. - Left atrium: The atrium was mildly to moderately dilated. - Tricuspid valve: Mild regurgitation.  Assessment and Plan:  1.  Permanent atrial fibrillation with CHA2DS2-VASc score 3.  She is now on Eliquis 2.5 mg twice daily (patient preference - I did talk with her about 5 mg twice daily dose being more optimal for her) and Cardizem CD 120 mg daily with good heart rate control overall.  She does not report any palpitations.  Requesting interval lab work from PCP.  2.  Essential hypertension, systolic is in the 696E today.  She is on Lotensin in addition to her Cardizem CD.  No changes were made today.  Medication Adjustments/Labs and Tests Ordered: Current medicines are reviewed at length with the patient today.  Concerns regarding medicines are outlined above.   Tests Ordered: Orders Placed This Encounter  Procedures   EKG 12-Lead    Medication Changes: No orders of the defined types were placed in this encounter.   Disposition:  Follow up  1 year.  Signed, Satira Sark, MD, [facility] 07/23/2021 9:04 AM    [place] [facility] [place], [place], [place] [address] Phone: [phone]; Fax: [phone]

## 2021-07-23 ENCOUNTER — Encounter: Payer: Self-pay | Admitting: Cardiology

## 2021-07-23 ENCOUNTER — Encounter: Payer: Self-pay | Admitting: *Deleted

## 2021-07-23 ENCOUNTER — Ambulatory Visit: Payer: Medicare Other | Admitting: Cardiology

## 2021-07-23 VITALS — BP 138/88 | HR 82 | Ht 62.0 in | Wt 170.2 lb

## 2021-07-23 DIAGNOSIS — I4821 Permanent atrial fibrillation: Secondary | ICD-10-CM | POA: Diagnosis not present

## 2021-07-23 DIAGNOSIS — I10 Essential (primary) hypertension: Secondary | ICD-10-CM | POA: Diagnosis not present

## 2021-07-23 NOTE — Patient Instructions (Addendum)

## 2022-03-06 ENCOUNTER — Other Ambulatory Visit: Payer: Self-pay | Admitting: Urology

## 2022-03-19 ENCOUNTER — Telehealth: Payer: Self-pay | Admitting: Cardiology

## 2022-03-19 DIAGNOSIS — Z79899 Other long term (current) drug therapy: Secondary | ICD-10-CM

## 2022-03-19 DIAGNOSIS — I4821 Permanent atrial fibrillation: Secondary | ICD-10-CM

## 2022-03-19 NOTE — Telephone Encounter (Signed)
   Pre-operative Risk Assessment    Patient Name: Tara Floyd  DOB: 02/03/1946 MRN: [id number]      Request for Surgical Clearance    Procedure:   Robotic Sacrocolpopexy  Date of Surgery:  Clearance 05/28/22                                 Surgeon:  Dr. Emogene Morgan Group or Practice Name:  [facility] Phone number:  [phone] Fax number:  [phone]   Type of Clearance Requested:   - Pharmacy:  Hold Apixaban (Eliquis)     Type of Anesthesia:  General    Additional requests/questions:  Please advise surgeon/provider what medications should be held.  Signed, Belisicia T Harris   03/19/2022, 9:35 AM

## 2022-03-23 NOTE — Telephone Encounter (Signed)
Patient with diagnosis of a fib on Eliquis for anticoagulation.   OF note, patient is on incorrect dose of Eliquis, Dr Domenic Polite is aware.  Procedure:  Robotic Sacrocolpopexy Date of procedure: 05/28/22   CHA2DS2-VASc Score = 4  This indicates a 4.8% annual risk of stroke. The patient's score is based upon: CHF History: 0 HTN History: 1 Diabetes History: 0 Stroke History: 0 Vascular Disease History: 0 Age Score: 2 Gender Score: 1   Patient overdue for lab work Platelet count: Overdue for lab work  Patient will need lab work before clearance can be completed

## 2022-03-23 NOTE — Telephone Encounter (Signed)
I tried to call the pt though no answer or vm came on

## 2022-03-23 NOTE — Telephone Encounter (Signed)
Primary Cardiologist:Samuel Domenic Polite, MD  Chart reviewed as part of pre-operative protocol coverage. Because of Tara Floyd's past medical history and time since last visit, he/she will require a virtual visit/telephone call in order to better assess preoperative cardiovascular risk.   Pre-op covering staff: Please call the patient and inform her that she will need lab work prior to virtual visit so that clearance can be obtained from pharmacy for Eliquis. Once lab work is complete, we will reroute to Pharm D and then schedule virtual visit - Please contact patient, obtain consent, and schedule appointment    [name], NP-C    03/23/2022, 10:13 AM [place] [address] Office [phone] Fax [phone]

## 2022-03-24 NOTE — Telephone Encounter (Signed)
Spoke with patient and she states that she will have her labs drawn at her PCP'S office, which is where she normally has them drawn at and have them fax the results to Dr McDowell's office as they have done in the past. I made her aware that once we have the results from her labs our pharmacist will review them and make recommendations and then we will scheduled her for a pre-op telehealth visit to address clearance for her procedure. Will route note to requesting surgeons office to make them aware.

## 2022-03-24 NOTE — Telephone Encounter (Signed)
[facility] [phone] Need paper from Dr. Domenic Polite to have blood drawn

## 2022-03-24 NOTE — Telephone Encounter (Signed)
BMET & CBC lab orders faxed to PCP office

## 2022-03-25 ENCOUNTER — Telehealth: Payer: Self-pay | Admitting: Cardiology

## 2022-03-25 MED ORDER — BENAZEPRIL HCL 10 MG PO TABS: 10.0000 mg | ORAL_TABLET | Freq: Every day | ORAL | 3 refills | Status: DC

## 2022-03-25 NOTE — Telephone Encounter (Signed)
*  STAT* If patient is at the pharmacy, call can be transferred to refill team.   1. Which medications need to be refilled? (please list name of each medication and dose if known) benazepril (LOTENSIN) 10 MG tablet  2. Which pharmacy/location (including street and city if local pharmacy) is medication to be sent to? [place], [place]  3. Do they need a 30 day or 90 day supply? [place]

## 2022-03-25 NOTE — Telephone Encounter (Signed)
Rx sent to pharmacy   

## 2022-03-30 NOTE — Telephone Encounter (Signed)
I called the pt to see if she had the lab work done with PCP. Pt answered yes that she had it done about 1 week ago. I stated that I do not see the lab results in the chart, nor did I see them under [facility]. Pt states she had them done with [facility]. I pulled up the labs from [facility] for [facility], but told the pt the last labs I see from them is 2020. Pt said someone else told her that as well. I found a ph# for [facility]. I will call them in the morning and ask them to please fax the results to our practice as they are needed for pre op clearance.  PH# [phone] Fax# [phone]  I think I will also fax this note over to [facility] to please fax CBC and BMET lab results that were done last week to fax# [phone] attn: pre op team for Dr. Domenic Polite, [facility]  If any questions they may call the office [phone] and ask to s/w the pre op team.

## 2022-03-31 ENCOUNTER — Telehealth: Payer: Self-pay | Admitting: *Deleted

## 2022-03-31 NOTE — Telephone Encounter (Signed)
Creatine clearance 46 ml/min Plt 209  Patient may hold Eliquis for 2 days prior to procedure.

## 2022-03-31 NOTE — Telephone Encounter (Signed)
LABS FROM [facility] FOR PRE OP CLEARANCE: I WILL UPDATE THE PRE OP PROVIDER AND PRE OP PHARM-D   BUN 16 CREATININE 1.01 NA 142 L+ 4.3 CA 9.3 eGFR 58 WBC 6.0 RBC 4.65 HgB 13.8 HCT 41.8 PLATELETS 209 MCV 90 MCH 29.7 MCHC 33.0

## 2022-03-31 NOTE — Telephone Encounter (Signed)
    Name: Tara Floyd  DOB: Jan 14, 1946  MRN: [id number]  Primary Cardiologist: Rozann Lesches, MD   Preoperative team, please contact this patient and set up a phone call appointment for further preoperative risk assessment. Please obtain consent and complete medication review. Thank you for your help.  I confirm that guidance regarding antiplatelet and oral anticoagulation therapy has been completed and, if necessary, noted below.    Ledora Bottcher, PA 03/31/2022, 1:25 PM [place] [address] [place], [place] [address]

## 2022-03-31 NOTE — Telephone Encounter (Signed)
I s/w [place]. It was stated to me that the labs were faxed last week to our [facility] office for Dr. Domenic Polite. I replied that I was not sure why the labs were not scanned in yet and I apologize for the double work on her end. Michela Pitcher they will fax the labs to [phone] attn: pre op team. Once I have the labs faxed over I will be sure to update the pre op provider and the pharm-d.   Once the pt has been cleared our office will be sure to fax clearance notes to requesting office.

## 2022-03-31 NOTE — Telephone Encounter (Signed)
I s/w the the pt and she is agreeable to plan of care tele pre op appt 04/17/22 @ 9 am. Med rec and consent are done.     Patient Consent for Virtual Visit        Tara Floyd has provided verbal consent on 03/31/2022 for a virtual visit (video or telephone).   CONSENT FOR VIRTUAL VISIT FOR:  Tara Floyd List  By participating in this virtual visit I agree to the following:  I hereby voluntarily request, consent and authorize [facility] and its employed or contracted physicians, physician assistants, nurse practitioners or other licensed health care professionals (the Practitioner), to provide me with telemedicine health care services (the "Services") as deemed necessary by the treating Practitioner. I acknowledge and consent to receive the Services by the Practitioner via telemedicine. I understand that the telemedicine visit will involve communicating with the Practitioner through live audiovisual communication technology and the disclosure of certain medical information by electronic transmission. I acknowledge that I have been given the opportunity to request an in-person assessment or other available alternative prior to the telemedicine visit and am voluntarily participating in the telemedicine visit.  I understand that I have the right to withhold or withdraw my consent to the use of telemedicine in the course of my care at any time, without affecting my right to future care or treatment, and that the Practitioner or I may terminate the telemedicine visit at any time. I understand that I have the right to inspect all information obtained and/or recorded in the course of the telemedicine visit and may receive copies of available information for a reasonable fee.  I understand that some of the potential risks of receiving the Services via telemedicine include:  Delay or interruption in medical evaluation due to technological equipment failure or disruption; Information transmitted may not be  sufficient (e.g. poor resolution of images) to allow for appropriate medical decision making by the Practitioner; and/or  In rare instances, security protocols could fail, causing a breach of personal health information.  Furthermore, I acknowledge that it is my responsibility to provide information about my medical history, conditions and care that is complete and accurate to the best of my ability. I acknowledge that Practitioner's advice, recommendations, and/or decision may be based on factors not within their control, such as incomplete or inaccurate data provided by me or distortions of diagnostic images or specimens that may result from electronic transmissions. I understand that the practice of medicine is not an exact science and that Practitioner makes no warranties or guarantees regarding treatment outcomes. I acknowledge that a copy of this consent can be made available to me via my patient portal ([place]), or I can request a printed copy by calling the office of [facility].    I understand that my insurance will be billed for this visit.   I have read or had this consent read to me. I understand the contents of this consent, which adequately explains the benefits and risks of the Services being provided via telemedicine.  I have been provided ample opportunity to ask questions regarding this consent and the Services and have had my questions answered to my satisfaction. I give my informed consent for the services to be provided through the use of telemedicine in my medical care

## 2022-03-31 NOTE — Telephone Encounter (Signed)
I will send lab report to HIM dept to scan into pt's chart.

## 2022-03-31 NOTE — Telephone Encounter (Signed)
I s/w the the pt and she is agreeable to plan of care tele pre op appt 04/17/22 @ 9 am. Med rec and consent are done.

## 2022-04-06 ENCOUNTER — Other Ambulatory Visit: Payer: Self-pay

## 2022-04-17 ENCOUNTER — Telehealth: Payer: Medicare Other

## 2022-04-23 ENCOUNTER — Ambulatory Visit (INDEPENDENT_AMBULATORY_CARE_PROVIDER_SITE_OTHER): Payer: Medicare Other | Admitting: Physician Assistant

## 2022-04-23 ENCOUNTER — Encounter: Payer: Self-pay | Admitting: Physician Assistant

## 2022-04-23 DIAGNOSIS — Z01810 Encounter for preprocedural cardiovascular examination: Secondary | ICD-10-CM

## 2022-04-23 NOTE — Progress Notes (Signed)
Virtual Visit via Telephone Note   Because of Tara Floyd's co-morbid illnesses, she is at least at moderate risk for complications without adequate follow up.  This format is felt to be most appropriate for this patient at this time.  The patient did not have access to video technology/had technical difficulties with video requiring transitioning to audio format only (telephone).  All issues noted in this document were discussed and addressed.  No physical exam could be performed with this format.  Please refer to the patient's chart for her consent to telehealth for [facility].  Evaluation Performed:  Preoperative cardiovascular risk assessment _____________   Date:  04/23/2022   Patient ID:  Tara Floyd, DOB 1946/08/20, MRN [id number] Patient Location:  Home Provider location:   Office  Primary Care Provider:  Adaline Sill, NP Primary Cardiologist:  Rozann Lesches, MD  Chief Complaint / Patient Profile   76 y.o. y/o female with a h/o PAF, essential hypertension, depression who is pending robotic sacrocolpopexy and presents today for telephonic preoperative cardiovascular risk assessment.  Hold Eliquis 2 days prior to procedure, resume when safe to do so.  Past Medical History    Past Medical History:  Diagnosis Date   Allergic rhinitis    Depression    DJD (degenerative joint disease)    Essential hypertension    PAF (paroxysmal atrial fibrillation) ([place])    Past Surgical History:  Procedure Laterality Date   CHOLECYSTECTOMY N/A 05/18/2018   Procedure: LAPAROSCOPIC CHOLECYSTECTOMY;  Surgeon: Virl Cagey, MD;  Location: [facility] ORS;  Service: General;  Laterality: N/A;   COLONOSCOPY N/A 09/27/2019   Procedure: COLONOSCOPY;  Surgeon: Rogene Houston, MD;  Location: [facility] ENDO SUITE;  Service: Endoscopy;  Laterality: N/A;  [place]akin HYSTERECTOMY  08/22/2019    Allergies  Allergies  Allergen  Reactions   Codeine Phosphate Other (See Comments)    "knocks me out"    History of Present Illness    Tara Floyd is a 76 y.o. female who presents via audio/video conferencing for a telehealth visit today.  Pt was last seen in cardiology clinic on 07/23/2021 by Dr. Domenic Polite.  At that time Tara Floyd was doing well.  The patient is now pending procedure as outlined above.   Since her last visit, she is doing well. No symptoms with afib.  She attends the gym and does aerobic classes.  She has a treadmill at home that she walks on or if she walks outside she tries to get her walks in in the early morning when it is cooler.  She has a small garden that she works from outside and she does all her own housework.  Because of this she scored a 5.62 METS on the DASI.  This is greater than the 4 METS minimum.  We discussed that she is able to hold her Eliquis 2 days prior to her procedure.  She will resume her Eliquis when safe to do so from a medical standpoint.  She knows about her annual follow-up appointment in October and plans to attend.   Home Medications    Prior to Admission medications   Medication Sig Start Date End Date Taking? Authorizing Provider  ALPRAZolam Duanne Moron) 0.5 MG tablet Take 0.5 mg by mouth at bedtime as needed for anxiety.     [provider]  apixaban (ELIQUIS) 2.5 MG TABS tablet Take  2.5 mg by mouth 2 (two) times daily.    [provider]  benazepril (LOTENSIN) 10 MG tablet Take 1 tablet (10 mg total) by mouth daily. 03/25/22   Satira Sark, MD  diclofenac sodium (VOLTAREN) 1 % GEL Apply 1 application topically 2 (two) times daily as needed (pain).  12/11/16   [provider]  diltiazem (CARDIZEM CD) 120 MG 24 hr capsule Take 1 capsule (120 mg total) by mouth daily. 05/10/20 07/23/22  Verta Ellen., NP  fluticasone Asencion Islam) 50 MCG/ACT nasal spray Place 2 sprays into both nostrils daily as needed for allergies.     [provider]   Glycerin-Hypromellose-PEG 400 (DRY EYE RELIEF DROPS) 0.2-0.2-1 % SOLN Place 1 drop into both eyes daily as needed (Dry eye).    [provider]  ibuprofen (ADVIL,MOTRIN) 200 MG tablet Take 800 mg by mouth every 6 (six) hours as needed for headache, mild pain or moderate pain.     [provider]  montelukast (SINGULAIR) 10 MG tablet Take 10 mg by mouth daily as needed (allergies). Alternate with Loratadine 07/01/19   [provider]    Physical Exam    Vital Signs:  Tara Floyd does not have vital signs available for review today.  Given telephonic nature of communication, physical exam is limited. AAOx3. NAD. Normal affect.  Speech and respirations are unlabored.  Accessory Clinical Findings    None  Assessment & Plan    1.  Preoperative Cardiovascular Risk Assessment:  [name]'s perioperative risk of a major cardiac event is 0.4% according to the Revised Cardiac Risk Index (RCRI).  Therefore, she is at low risk for perioperative complications.   Her functional capacity is good at 5.62 METs according to the [facility] Activity Status Index (DASI). Recommendations: According to ACC/AHA guidelines, no further cardiovascular testing needed.  The patient may proceed to surgery at acceptable risk.   Antiplatelet and/or Anticoagulation Recommendations: Eliquis (Apixaban) can be held for 2 days prior to surgery.  Please resume post op when felt to be safe.     A copy of this note will be routed to requesting surgeon.  Time:   Today, I have spent 10 minutes with the patient with telehealth technology discussing medical history, symptoms, and management plan.     Elgie Collard, PA-C  04/23/2022, 8:43 AM

## 2022-04-27 ENCOUNTER — Other Ambulatory Visit (HOSPITAL_COMMUNITY): Payer: Medicare Other

## 2022-05-11 ENCOUNTER — Encounter (HOSPITAL_COMMUNITY): Payer: Medicare Other

## 2022-05-15 NOTE — Patient Instructions (Addendum)
SURGICAL WAITING ROOM VISITATION Patients having surgery or a procedure may have no more than 2 support people in the waiting area - these visitors may rotate.   Children under the age of 77 must have an adult with them who is not the patient. If the patient needs to stay at the hospital during part of their recovery, the visitor guidelines for inpatient rooms apply. Pre-op nurse will coordinate an appropriate time for 1 support person to accompany patient in pre-op.  This support person may not rotate.    Please refer to the [facility] website for the visitor guidelines for Inpatients (after your surgery is over and you are in a regular room).    Your procedure is scheduled on: 05/28/22   Report to [facility] Main Entrance    Report to admitting at 5:15 AM   Call this number if you have problems the morning of surgery [phone]   Do not eat food :After Midnight.   After Midnight you may have the following liquids until 4:30 AM DAY OF SURGERY  Water Non-Citrus Juices (without pulp, NO RED) Carbonated Beverages Black Coffee (NO MILK/CREAM OR CREAMERS, sugar ok)  Clear Tea (NO MILK/CREAM OR CREAMERS, sugar ok) regular and decaf                             Plain Jell-O (NO RED)                                           Fruit ices (not with fruit pulp, NO RED)                                     Popsicles (NO RED)                                                               Sports drinks like Gatorade (NO RED)  FOLLOW BOWEL PREP AND ANY ADDITIONAL PRE OP INSTRUCTIONS YOU RECEIVED FROM YOUR SURGEON'S OFFICE!!!     Oral Hygiene is also important to reduce your risk of infection.                                    Remember - BRUSH YOUR TEETH THE MORNING OF SURGERY WITH YOUR REGULAR TOOTHPASTE   Take these medicines the morning of surgery with A SIP OF WATER: Tylenol, Xanax, Diltiazem, Claritin, Omeprazole   These are anesthesia recommendations for holding your  anticoagulants.  Please contact your prescribing physician to confirm IF it is safe to hold your anticoagulants for this length of time.   Eliquis Apixaban   72 hours   Xarelto Rivaroxaban   72 hours  Plavix Clopidogrel   120 hours  Pletal Cilostazol   120 hours                                 You may not have any metal on  your body including hair pins, jewelry, and body piercing             Do not wear make-up, lotions, powders, perfumes, or deodorant  Do not wear nail polish including gel and S&S, artificial/acrylic nails, or any other type of covering on natural nails including finger and toenails. If you have artificial nails, gel coating, etc. that needs to be removed by a nail salon please have this removed prior to surgery or surgery may need to be canceled/ delayed if the surgeon/ anesthesia feels like they are unable to be safely monitored.   Do not shave  48 hours prior to surgery.    Do not bring valuables to the hospital. [place].   Contacts, dentures or bridgework may not be worn into surgery.   Bring small overnight bag day of surgery.   DO NOT [place]. PHARMACY WILL DISPENSE MEDICATIONS LISTED ON YOUR MEDICATION LIST TO YOU DURING YOUR ADMISSION [place]!              Please read over the following fact sheets you were given: IF YOU HAVE QUESTIONS ABOUT YOUR PRE-OP INSTRUCTIONS PLEASE CALL [facility] - Preparing for Surgery Before surgery, you can play an important role.  Because skin is not sterile, your skin needs to be as free of germs as possible.  You can reduce the number of germs on your skin by washing with CHG (chlorahexidine gluconate) soap before surgery.  CHG is an antiseptic cleaner which kills germs and bonds with the skin to continue killing germs even after washing. Please DO NOT use if you have an allergy to CHG or antibacterial soaps.   If your skin becomes reddened/irritated stop using the CHG and inform your nurse when you arrive at Short Stay. Do not shave (including legs and underarms) for at least 48 hours prior to the first CHG shower.  You may shave your face/neck.  Please follow these instructions carefully:  1.  Shower with CHG Soap the night before surgery and the  morning of surgery.  2.  If you choose to wash your hair, wash your hair first as usual with your normal  shampoo.  3.  After you shampoo, rinse your hair and body thoroughly to remove the shampoo.                             4.  Use CHG as you would any other liquid soap.  You can apply chg directly to the skin and wash.  Gently with a scrungie or clean washcloth.  5.  Apply the CHG Soap to your body ONLY FROM THE NECK DOWN.   Do   not use on face/ open                           Wound or open sores. Avoid contact with eyes, ears mouth and   genitals (private parts).                       Wash face,  Genitals (private parts) with your normal soap.             6.  Wash thoroughly, paying special attention to the area  where your    surgery  will be performed.  7.  Thoroughly rinse your body with warm water from the neck down.  8.  DO NOT shower/wash with your normal soap after using and rinsing off the CHG Soap.                9.  Pat yourself dry with a clean towel.            10.  Wear clean pajamas.            11.  Place clean sheets on your bed the night of your first shower and do not  sleep with pets. Day of Surgery : Do not apply any lotions/deodorants the morning of surgery.  Please wear clean clothes to the hospital/surgery center.  FAILURE TO FOLLOW THESE INSTRUCTIONS MAY RESULT IN THE CANCELLATION OF YOUR SURGERY  PATIENT SIGNATURE_________________________________  NURSE SIGNATURE__________________________________  ________________________________________________________________________  WHAT IS A BLOOD TRANSFUSION? Blood Transfusion  Information  A transfusion is the replacement of blood or some of its parts. Blood is made up of multiple cells which provide different functions. Red blood cells carry oxygen and are used for blood loss replacement. White blood cells fight against infection. Platelets control bleeding. Plasma helps clot blood. Other blood products are available for specialized needs, such as hemophilia or other clotting disorders. BEFORE THE TRANSFUSION  Who gives blood for transfusions?  Healthy volunteers who are fully evaluated to make sure their blood is safe. This is blood bank blood. Transfusion therapy is the safest it has ever been in the practice of medicine. Before blood is taken from a donor, a complete history is taken to make sure that person has no history of diseases nor engages in risky social behavior (examples are intravenous drug use or sexual activity with multiple partners). The donor's travel history is screened to minimize risk of transmitting infections, such as malaria. The donated blood is tested for signs of infectious diseases, such as HIV and hepatitis. The blood is then tested to be sure it is compatible with you in order to minimize the chance of a transfusion reaction. If you or a relative donates blood, this is often done in anticipation of surgery and is not appropriate for emergency situations. It takes many days to process the donated blood. RISKS AND COMPLICATIONS Although transfusion therapy is very safe and saves many lives, the main dangers of transfusion include:  Getting an infectious disease. Developing a transfusion reaction. This is an allergic reaction to something in the blood you were given. Every precaution is taken to prevent this. The decision to have a blood transfusion has been considered carefully by your caregiver before blood is given. Blood is not given unless the benefits outweigh the risks. AFTER THE TRANSFUSION Right after receiving a blood transfusion,  you will usually feel much better and more energetic. This is especially true if your red blood cells have gotten low (anemic). The transfusion raises the level of the red blood cells which carry oxygen, and this usually causes an energy increase. The nurse administering the transfusion will monitor you carefully for complications. HOME CARE INSTRUCTIONS  No special instructions are needed after a transfusion. You may find your energy is better. Speak with your caregiver about any limitations on activity for underlying diseases you may have. SEEK MEDICAL CARE IF:  Your condition is not improving after your transfusion. You develop redness or irritation at the intravenous (IV) site. SEEK IMMEDIATE MEDICAL CARE IF:  Any  of the following symptoms occur over the next 12 hours: Shaking chills. You have a temperature by mouth above 102 F (38.9 C), not controlled by medicine. Chest, back, or muscle pain. People around you feel you are not acting correctly or are confused. Shortness of breath or difficulty breathing. Dizziness and fainting. You get a rash or develop hives. You have a decrease in urine output. Your urine turns a dark color or changes to pink, red, or brown. Any of the following symptoms occur over the next 10 days: You have a temperature by mouth above 102 F (38.9 C), not controlled by medicine. Shortness of breath. Weakness after normal activity. The white part of the eye turns yellow (jaundice). You have a decrease in the amount of urine or are urinating less often. Your urine turns a dark color or changes to pink, red, or brown. Document Released: 09/25/2000 Document Revised: 12/21/2011 Document Reviewed: 05/14/2008 [facility] Patient Information 2014 [place], [place].  _______________________________________________________________________

## 2022-05-15 NOTE — Progress Notes (Addendum)
COVID Vaccine Completed: yes   Date of COVID positive in last 90 days: no  PCP - Sharlyne Cai, NP Cardiologist - Rozann Lesches, MD  Cardiac clearance by Konrad Saha 04/23/22 in Epic  Chest x-ray - n/a EKG - 07/23/21 Epic Stress Test - more than 10 years ago per pt ECHO - 2015 Cardiac Cath - n/a Pacemaker/ICD device last checked: n/a Spinal Cord Stimulator: n/a  Bowel Prep - Miralax, colace patient has instructions  Sleep Study - n/a CPAP -   Fasting Blood Sugar - n/a Checks Blood Sugar _____ times a day  Blood Thinner Instructions: Eliquis, hold 2 days Aspirin Instructions: Last Dose: 05/25/22 0700  Activity level: Can go up a flight of stairs and perform activities of daily living without stopping and without symptoms of chest pain or shortness of breath.    Anesthesia review: a fib, HTN, hypoxemic respiratory failure  Patient denies shortness of breath, fever, cough and chest pain at PAT appointment  Patient verbalized understanding of instructions that were given to them at the PAT appointment. Patient was also instructed that they will need to review over the PAT instructions again at home before surgery.

## 2022-05-18 ENCOUNTER — Encounter (HOSPITAL_COMMUNITY): Payer: Self-pay

## 2022-05-18 ENCOUNTER — Encounter (HOSPITAL_COMMUNITY)
Admission: RE | Admit: 2022-05-18 | Discharge: 2022-05-18 | Disposition: A | Discharge status: Home / Self Care | Payer: Medicare Other | Source: Ambulatory Visit | Attending: Urology | Admitting: Urology

## 2022-05-18 DIAGNOSIS — I4821 Permanent atrial fibrillation: Secondary | ICD-10-CM | POA: Diagnosis not present

## 2022-05-18 DIAGNOSIS — Z01812 Encounter for preprocedural laboratory examination: Secondary | ICD-10-CM | POA: Diagnosis present

## 2022-05-18 HISTORY — DX: Gastro-esophageal reflux disease without esophagitis: K21.9

## 2022-05-18 LAB — CBC
HCT: 47.5 % — ABNORMAL HIGH (ref 36.0–46.0)
Hemoglobin: 15.5 g/dL — ABNORMAL HIGH (ref 12.0–15.0)
MCH: 30.2 pg (ref 26.0–34.0)
MCHC: 32.6 g/dL (ref 30.0–36.0)
MCV: 92.6 fL (ref 80.0–100.0)
Platelets: 231 10*3/uL (ref 150–400)
RBC: 5.13 MIL/uL — ABNORMAL HIGH (ref 3.87–5.11)
RDW: 13.8 % (ref 11.5–15.5)
WBC: 6.2 10*3/uL (ref 4.0–10.5)
nRBC: 0 % (ref 0.0–0.2)

## 2022-05-18 LAB — COMPREHENSIVE METABOLIC PANEL
ALT: 14 U/L (ref 0–44)
AST: 20 U/L (ref 15–41)
Albumin: 4.3 g/dL (ref 3.5–5.0)
Alkaline Phosphatase: 49 U/L (ref 38–126)
Anion gap: 9 (ref 5–15)
BUN: 13 mg/dL (ref 8–23)
CO2: 25 mmol/L (ref 22–32)
Calcium: 9.2 mg/dL (ref 8.9–10.3)
Chloride: 106 mmol/L (ref 98–111)
Creatinine, Ser: 0.83 mg/dL (ref 0.44–1.00)
GFR, Estimated: >60 mL/min (ref >60)
Glucose, Bld: 103 mg/dL — ABNORMAL HIGH (ref 70–99)
Potassium: 4 mmol/L (ref 3.5–5.1)
Sodium: 140 mmol/L (ref 135–145)
Total Bilirubin: 0.5 mg/dL (ref 0.3–1.2)
Total Protein: 8 g/dL (ref 6.5–8.1)

## 2022-05-19 LAB — URINE CULTURE: Culture: NO GROWTH

## 2022-05-20 NOTE — Progress Notes (Signed)
Anesthesia Chart Review:   Case: [id number] Date/Time: 05/28/22 0715   Procedure: XI ROBOTIC ASSISTED LAPAROSCOPIC SACROCOLPOPEXY   Anesthesia type: General   Pre-op diagnosis: CYSTOCELE   Location: [facility] ROOM 05 / [facility]   Surgeons: Ardis Hughs, MD       DISCUSSION: Pt is 76 years old with hx PAF, HTN  Pt to hold eliquis 2 days before surgery   VS: BP (!) 147/75   Pulse 100   Temp 36.6 C (Oral)   Resp 14   Ht '5\' 3"'$  (1.6 m)   Wt 75.5 kg   SpO2 98%   BMI 29.48 kg/m   PROVIDERS: - PCP is Adaline Sill, NP - Cardiologist is Rozann Lesches, MD. Last office visit 07/23/21. Cleared for surgery at acceptable risk at last video visit 04/23/22 with Nicholes Rough, PA  LABS: Labs reviewed: Acceptable for surgery. (all labs ordered are listed, but only abnormal results are displayed)  Labs Reviewed  CBC - Abnormal; Notable for the following components:      Result Value   RBC 5.13 (*)    Hemoglobin 15.5 (*)    HCT 47.5 (*)    All other components within normal limits  COMPREHENSIVE METABOLIC PANEL - Abnormal; Notable for the following components:   Glucose, Bld 103 (*)    All other components within normal limits  URINE CULTURE  TYPE AND SCREEN     EKG 07/23/21: atrial fibrillation with RVR 103 bpm. LAD   CV:  Echo 02/22/14:  - Left ventricle: The cavity size was normal. Wall thickness was increased in a pattern of mild LVH. Systolic function was normal. The estimated ejection fraction was in the range of 60% to 65%. Wall motion was normal; there were no regional wall motion abnormalities. There was an increased relative contribution of atrial contraction to ventricular filling. Doppler parameters are consistent with abnormal left ventricular relaxation (grade 1 diastolic dysfunction).  - Aortic valve: Trileaflet; mildly thickened leaflets. There was no stenosis.  - Mitral valve: Mildly thickened leaflets . Mild regurgitation.  - Left atrium: The atrium was mildly  to moderately dilated.  - Tricuspid valve: Mild regurgitation.    Past Medical History:  Diagnosis Date   Allergic rhinitis    Depression    DJD (degenerative joint disease)    Essential hypertension    GERD (gastroesophageal reflux disease)    PAF (paroxysmal atrial fibrillation) ([place])     Past Surgical History:  Procedure Laterality Date   CHOLECYSTECTOMY N/A 05/18/2018   Procedure: LAPAROSCOPIC CHOLECYSTECTOMY;  Surgeon: Virl Cagey, MD;  Location: [facility];  Service: General;  Laterality: N/A;   COLONOSCOPY N/A 09/27/2019   Procedure: COLONOSCOPY;  Surgeon: Rogene Houston, MD;  Location: [facility];  Service: Endoscopy;  Laterality: N/A;  [place] HYSTERECTOMY  08/22/2019    MEDICATIONS:  acetaminophen (TYLENOL) 325 MG tablet   ALPRAZolam (XANAX) 0.5 MG tablet   apixaban (ELIQUIS) 2.5 MG TABS tablet   benazepril (LOTENSIN) 10 MG tablet   diclofenac sodium (VOLTAREN) 1 % GEL   diltiazem (CARDIZEM CD) 120 MG 24 hr capsule   loratadine (CLARITIN) 10 MG tablet   omeprazole (PRILOSEC) 40 MG capsule   Propylene Glycol (SYSTANE COMPLETE) 0.6 % SOLN   No current facility-administered medications for this encounter.    If no changes, I anticipate pt can proceed with surgery as scheduled.  Willeen Cass, PhD, FNP-BC [facility]/Anesthesiology Phone: [phone] 05/20/2022 11:37 AM

## 2022-05-20 NOTE — Anesthesia Preprocedure Evaluation (Addendum)
Anesthesia Evaluation  Patient identified by MRN, date of birth, ID band Patient awake    Reviewed: Allergy & Precautions, NPO status , Patient's Chart, lab work & pertinent test results  Airway Mallampati: II  TM Distance: >3 FB Neck ROM: Full    Dental no notable dental hx.    Pulmonary neg pulmonary ROS, former smoker,    Pulmonary exam normal breath sounds clear to auscultation       Cardiovascular hypertension, Normal cardiovascular exam+ dysrhythmias Atrial Fibrillation  Rhythm:Regular Rate:Normal     Neuro/Psych negative neurological ROS  negative psych ROS   GI/Hepatic Neg liver ROS, GERD  ,  Endo/Other  negative endocrine ROS  Renal/GU negative Renal ROS  negative genitourinary   Musculoskeletal negative musculoskeletal ROS (+)   Abdominal   Peds negative pediatric ROS (+)  Hematology negative hematology ROS (+)   Anesthesia Other Findings   Reproductive/Obstetrics negative OB ROS                           Anesthesia Physical Anesthesia Plan  ASA: 3  Anesthesia Plan: General   Post-op Pain Management:    Induction: Intravenous  PONV Risk Score and Plan: 3 and Ondansetron, Dexamethasone and Treatment may vary due to age or medical condition  Airway Management Planned: Oral ETT  Additional Equipment:   Intra-op Plan:   Post-operative Plan: Extubation in OR  Informed Consent: I have reviewed the patients History and Physical, chart, labs and discussed the procedure including the risks, benefits and alternatives for the proposed anesthesia with the patient or authorized representative who has indicated his/her understanding and acceptance.     Dental advisory given  Plan Discussed with: CRNA and Surgeon  Anesthesia Plan Comments: (See APP note by Durel Salts, FNP )       Anesthesia Quick Evaluation

## 2022-05-24 IMAGING — DX DG WRIST COMPLETE 3+V*R*
4 series · 4 of 4 positions shown · non-contrast
Comparison: None.

CLINICAL DATA: Right wrist pain and swelling after fall

EXAM:
RIGHT WRIST - COMPLETE 3+ VIEW

[wrist pa]
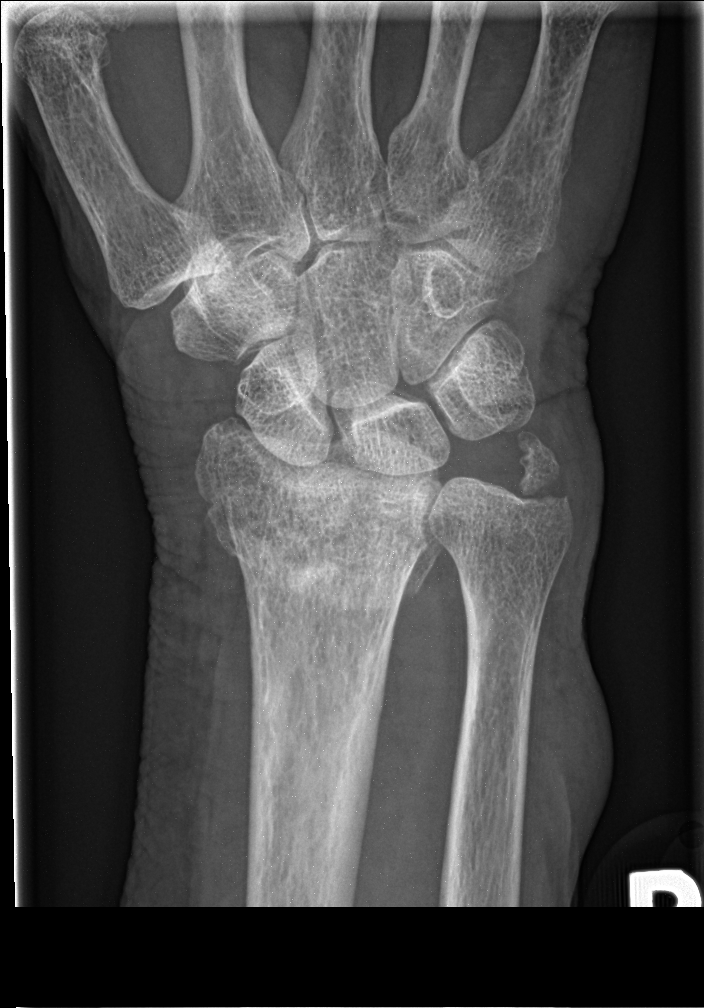

[wrist navicular]
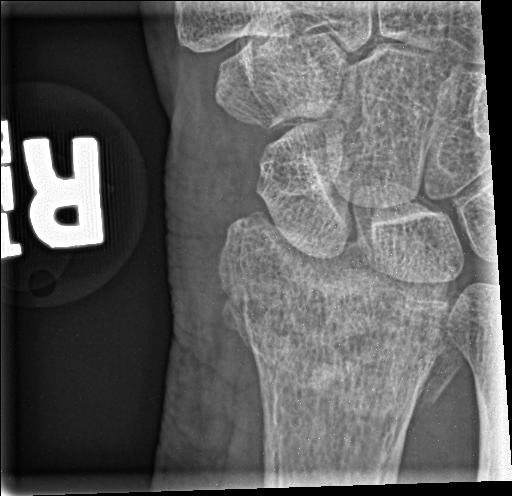

[wrist obl]
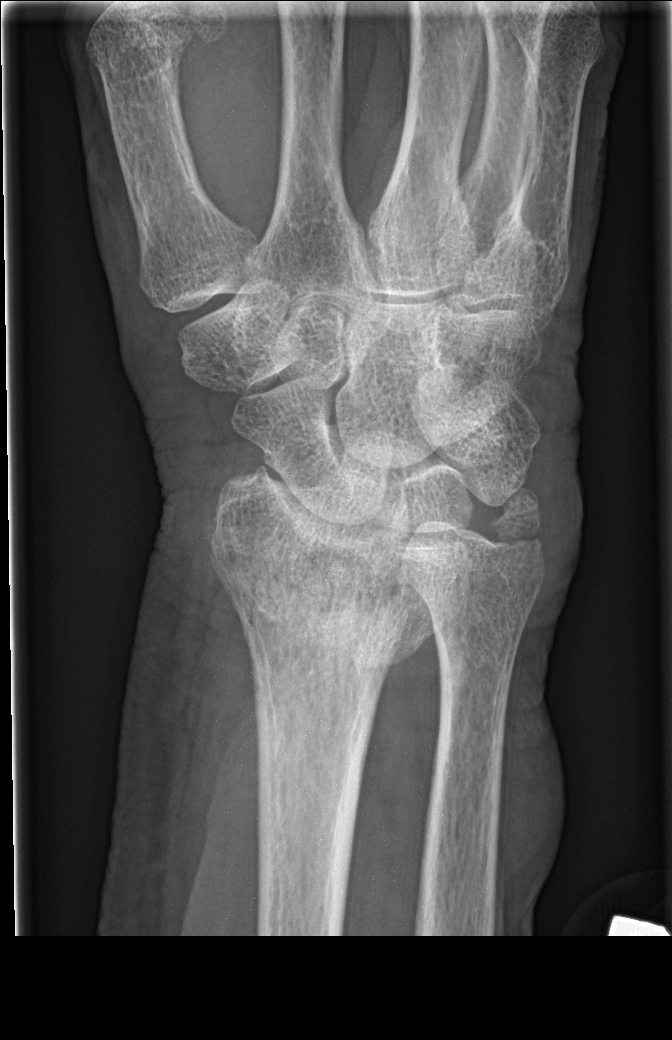

[wrist lat]
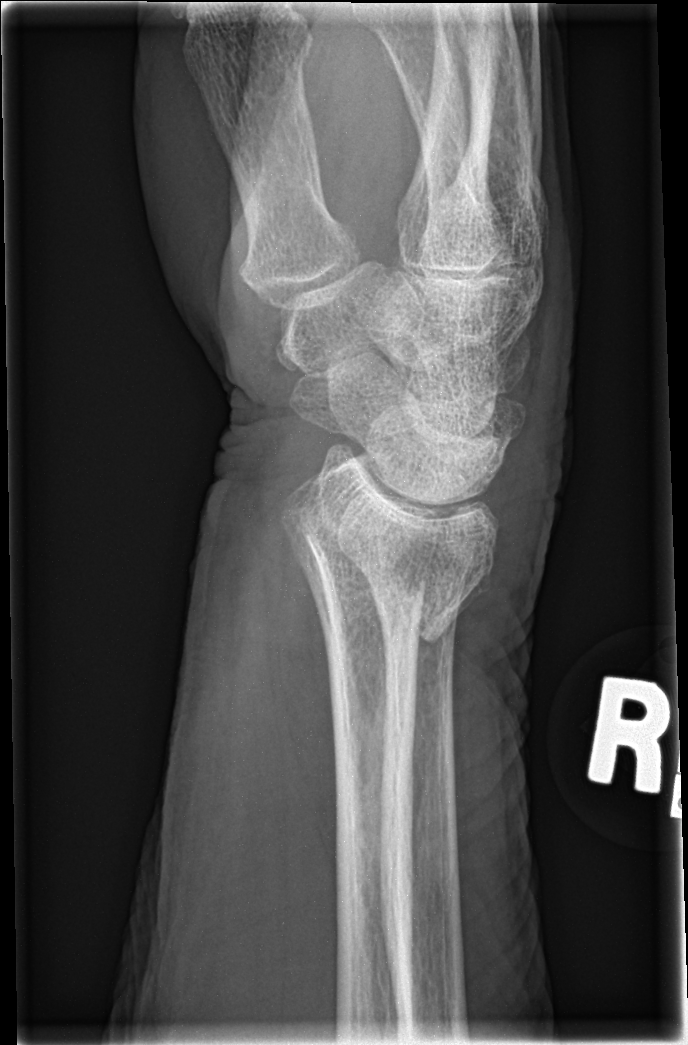

[4 of 4 positions shown; findings below may reference images not displayed]

FINDINGS: Acute impacted fracture of the distal radial metaphysis with 2 mm of
dorsal displacement and slight dorsal angulation. Intra-articular
extension to the radiocarpal joint is suspected. Minimally displaced
fracture at the base of the ulnar styloid. Carpal bones appear
intact without fracture or malalignment. Soft tissue swelling is
evident at the wrist.
IMPRESSION: 1. Acute impacted fracture of the distal radial metaphysis with 2 mm
of dorsal displacement and slight dorsal angulation.
2. Minimally displaced ulnar styloid fracture.

## 2022-05-28 ENCOUNTER — Other Ambulatory Visit: Payer: Self-pay

## 2022-05-28 ENCOUNTER — Ambulatory Visit (HOSPITAL_COMMUNITY): Payer: Medicare Other | Admitting: Emergency Medicine

## 2022-05-28 ENCOUNTER — Ambulatory Visit (HOSPITAL_BASED_OUTPATIENT_CLINIC_OR_DEPARTMENT_OTHER): Payer: Medicare Other | Admitting: Certified Registered"

## 2022-05-28 ENCOUNTER — Encounter (HOSPITAL_COMMUNITY): Payer: Self-pay | Admitting: Urology

## 2022-05-28 ENCOUNTER — Observation Stay (HOSPITAL_COMMUNITY)
Admission: RE | Admit: 2022-05-28 | Discharge: 2022-05-29 | Disposition: A | Discharge status: Home / Self Care | Payer: Medicare Other | Attending: Urology | Admitting: Urology

## 2022-05-28 ENCOUNTER — Encounter (HOSPITAL_COMMUNITY)
Admission: RE | Disposition: A | Discharge status: Home / Self Care | Payer: Self-pay | Source: Home / Self Care | Attending: Urology

## 2022-05-28 DIAGNOSIS — Z79899 Other long term (current) drug therapy: Secondary | ICD-10-CM | POA: Diagnosis not present

## 2022-05-28 DIAGNOSIS — Z87891 Personal history of nicotine dependence: Secondary | ICD-10-CM

## 2022-05-28 DIAGNOSIS — I4891 Unspecified atrial fibrillation: Secondary | ICD-10-CM | POA: Diagnosis not present

## 2022-05-28 DIAGNOSIS — N3941 Urge incontinence: Secondary | ICD-10-CM | POA: Diagnosis not present

## 2022-05-28 DIAGNOSIS — N8111 Cystocele, midline: Secondary | ICD-10-CM | POA: Insufficient documentation

## 2022-05-28 DIAGNOSIS — N819 Female genital prolapse, unspecified: Secondary | ICD-10-CM | POA: Diagnosis present

## 2022-05-28 DIAGNOSIS — N8189 Other female genital prolapse: Secondary | ICD-10-CM | POA: Diagnosis present

## 2022-05-28 DIAGNOSIS — I10 Essential (primary) hypertension: Secondary | ICD-10-CM | POA: Diagnosis not present

## 2022-05-28 DIAGNOSIS — Z7901 Long term (current) use of anticoagulants: Secondary | ICD-10-CM | POA: Insufficient documentation

## 2022-05-28 HISTORY — PX: ROBOTIC ASSISTED LAPAROSCOPIC SACROCOLPOPEXY: SHX5388

## 2022-05-28 LAB — TYPE AND SCREEN
ABO/RH(D): A POS
Antibody Screen: NEGATIVE

## 2022-05-28 LAB — ABO/RH: ABO/RH(D): A POS

## 2022-05-28 SURGERY — SACROCOLPOPEXY, ROBOT-ASSISTED, LAPAROSCOPIC
Anesthesia: General

## 2022-05-28 MED ORDER — LACTATED RINGERS IV SOLN: INTRAVENOUS | Status: DC

## 2022-05-28 MED ORDER — POLYETHYLENE GLYCOL 3350 17 GM/SCOOP PO POWD
1.0000 | Freq: Once | ORAL | Status: DC
  Filled 2022-05-28: qty 255

## 2022-05-28 MED ORDER — PHENYLEPHRINE 80 MCG/ML (10ML) SYRINGE FOR IV PUSH (FOR BLOOD PRESSURE SUPPORT)
PREFILLED_SYRINGE | INTRAVENOUS | Status: AC
  Filled 2022-05-28: qty 10

## 2022-05-28 MED ORDER — TRAMADOL HCL 50 MG PO TABS: 50.0000 mg | ORAL_TABLET | Freq: Four times a day (QID) | ORAL | 0 refills | Status: DC | PRN

## 2022-05-28 MED ORDER — LACTATED RINGERS IR SOLN
Status: DC | PRN
  Administered 2022-05-28: 1000 mL

## 2022-05-28 MED ORDER — PROPOFOL 10 MG/ML IV BOLUS
INTRAVENOUS | Status: AC
  Filled 2022-05-28: qty 20

## 2022-05-28 MED ORDER — STERILE WATER FOR IRRIGATION IR SOLN
Status: DC | PRN
  Administered 2022-05-28: 1000 mL

## 2022-05-28 MED ORDER — ESTRADIOL 0.1 MG/GM VA CREA
TOPICAL_CREAM | VAGINAL | Status: AC
  Filled 2022-05-28: qty 42.5

## 2022-05-28 MED ORDER — DEXAMETHASONE SODIUM PHOSPHATE 10 MG/ML IJ SOLN
INTRAMUSCULAR | Status: AC
  Filled 2022-05-28: qty 1

## 2022-05-28 MED ORDER — ONDANSETRON HCL 4 MG/2ML IJ SOLN
INTRAMUSCULAR | Status: AC
  Filled 2022-05-28: qty 2

## 2022-05-28 MED ORDER — ONDANSETRON HCL 4 MG/2ML IJ SOLN
INTRAMUSCULAR | Status: DC | PRN
  Administered 2022-05-28: 4 mg via INTRAVENOUS

## 2022-05-28 MED ORDER — ONDANSETRON HCL 4 MG/2ML IJ SOLN
4.0000 mg | INTRAMUSCULAR | Status: DC | PRN
Start: 2022-05-28 — End: 2022-05-29
  Administered 2022-05-28: 4 mg via INTRAVENOUS
  Filled 2022-05-28: qty 2

## 2022-05-28 MED ORDER — EPHEDRINE 5 MG/ML INJ
INTRAVENOUS | Status: AC
  Filled 2022-05-28: qty 5

## 2022-05-28 MED ORDER — ACETAMINOPHEN 10 MG/ML IV SOLN
INTRAVENOUS | Status: AC
  Filled 2022-05-28: qty 100

## 2022-05-28 MED ORDER — EPHEDRINE SULFATE-NACL 50-0.9 MG/10ML-% IV SOSY
PREFILLED_SYRINGE | INTRAVENOUS | Status: DC | PRN
  Administered 2022-05-28: 5 mg via INTRAVENOUS

## 2022-05-28 MED ORDER — ESTRADIOL 0.1 MG/GM VA CREA: 1.0000 | TOPICAL_CREAM | VAGINAL | 1 refills | Status: DC

## 2022-05-28 MED ORDER — FENTANYL CITRATE (PF) 100 MCG/2ML IJ SOLN
INTRAMUSCULAR | Status: AC
  Filled 2022-05-28: qty 2

## 2022-05-28 MED ORDER — ROCURONIUM BROMIDE 10 MG/ML (PF) SYRINGE
PREFILLED_SYRINGE | INTRAVENOUS | Status: AC
  Filled 2022-05-28: qty 10

## 2022-05-28 MED ORDER — LORATADINE 10 MG PO TABS
10.0000 mg | ORAL_TABLET | Freq: Every day | ORAL | Status: DC | PRN
Start: 2022-05-28 — End: 2022-05-29

## 2022-05-28 MED ORDER — ONDANSETRON HCL 4 MG/2ML IJ SOLN
4.0000 mg | Freq: Once | INTRAMUSCULAR | Status: AC | PRN
  Administered 2022-05-28: 4 mg via INTRAVENOUS

## 2022-05-28 MED ORDER — PROPOFOL 10 MG/ML IV BOLUS
INTRAVENOUS | Status: DC | PRN
  Administered 2022-05-28: 140 mg via INTRAVENOUS

## 2022-05-28 MED ORDER — CHLORHEXIDINE GLUCONATE 0.12 % MT SOLN
15.0000 mL | Freq: Once | OROMUCOSAL | Status: AC
  Administered 2022-05-28: 15 mL via OROMUCOSAL

## 2022-05-28 MED ORDER — LIDOCAINE 2% (20 MG/ML) 5 ML SYRINGE
INTRAMUSCULAR | Status: DC | PRN
  Administered 2022-05-28: 100 mg via INTRAVENOUS

## 2022-05-28 MED ORDER — PANTOPRAZOLE SODIUM 40 MG PO TBEC
40.0000 mg | DELAYED_RELEASE_TABLET | Freq: Every day | ORAL | Status: DC
  Administered 2022-05-29: 40 mg via ORAL
  Filled 2022-05-28: qty 1

## 2022-05-28 MED ORDER — TRAMADOL HCL 50 MG PO TABS: 50.0000 mg | ORAL_TABLET | Freq: Four times a day (QID) | ORAL | Status: DC | PRN

## 2022-05-28 MED ORDER — DILTIAZEM HCL ER COATED BEADS 120 MG PO CP24
120.0000 mg | ORAL_CAPSULE | Freq: Every day | ORAL | Status: DC
  Administered 2022-05-29: 120 mg via ORAL
  Filled 2022-05-28: qty 1

## 2022-05-28 MED ORDER — HYDROMORPHONE HCL 1 MG/ML IJ SOLN
0.2500 mg | INTRAMUSCULAR | Status: DC | PRN
  Administered 2022-05-28 (×2): 0.5 mg via INTRAVENOUS

## 2022-05-28 MED ORDER — BUPIVACAINE-EPINEPHRINE (PF) 0.5% -1:200000 IJ SOLN
INTRAMUSCULAR | Status: AC
  Filled 2022-05-28: qty 30

## 2022-05-28 MED ORDER — FENTANYL CITRATE (PF) 250 MCG/5ML IJ SOLN
INTRAMUSCULAR | Status: DC | PRN
  Administered 2022-05-28 (×5): 50 ug via INTRAVENOUS

## 2022-05-28 MED ORDER — ESTRADIOL 0.1 MG/GM VA CREA
TOPICAL_CREAM | VAGINAL | Status: DC | PRN
  Administered 2022-05-28: 1 via VAGINAL

## 2022-05-28 MED ORDER — DOCUSATE SODIUM 100 MG PO CAPS: 100.0000 mg | ORAL_CAPSULE | Freq: Two times a day (BID) | ORAL | Status: DC

## 2022-05-28 MED ORDER — PHENYLEPHRINE HCL-NACL 20-0.9 MG/250ML-% IV SOLN
INTRAVENOUS | Status: DC | PRN
  Administered 2022-05-28: 20 ug/min via INTRAVENOUS

## 2022-05-28 MED ORDER — LIDOCAINE HCL (PF) 2 % IJ SOLN
INTRAMUSCULAR | Status: AC
  Filled 2022-05-28: qty 5

## 2022-05-28 MED ORDER — OXYCODONE HCL 5 MG PO TABS: 5.0000 mg | ORAL_TABLET | Freq: Once | ORAL | Status: DC | PRN

## 2022-05-28 MED ORDER — DEXAMETHASONE SODIUM PHOSPHATE 10 MG/ML IJ SOLN
INTRAMUSCULAR | Status: DC | PRN
  Administered 2022-05-28: 4 mg via INTRAVENOUS

## 2022-05-28 MED ORDER — CLINDAMYCIN PHOSPHATE 2 % VA CREA
TOPICAL_CREAM | VAGINAL | Status: AC
  Filled 2022-05-28: qty 40

## 2022-05-28 MED ORDER — SULFAMETHOXAZOLE-TRIMETHOPRIM 800-160 MG PO TABS: 1.0000 | ORAL_TABLET | Freq: Two times a day (BID) | ORAL | 0 refills | Status: DC

## 2022-05-28 MED ORDER — OXYCODONE HCL 5 MG/5ML PO SOLN: 5.0000 mg | Freq: Once | ORAL | Status: DC | PRN

## 2022-05-28 MED ORDER — ALPRAZOLAM 0.5 MG PO TABS
0.5000 mg | ORAL_TABLET | Freq: Three times a day (TID) | ORAL | Status: DC | PRN
Start: 2022-05-28 — End: 2022-05-29

## 2022-05-28 MED ORDER — BUPIVACAINE LIPOSOME 1.3 % IJ SUSP
INTRAMUSCULAR | Status: DC | PRN
  Administered 2022-05-28: 20 mL

## 2022-05-28 MED ORDER — ORAL CARE MOUTH RINSE: 15.0000 mL | Freq: Once | OROMUCOSAL | Status: AC

## 2022-05-28 MED ORDER — KETOROLAC TROMETHAMINE 15 MG/ML IJ SOLN
15.0000 mg | Freq: Four times a day (QID) | INTRAMUSCULAR | Status: DC
Start: 2022-05-28 — End: 2022-05-29
  Administered 2022-05-28 – 2022-05-29 (×3): 15 mg via INTRAVENOUS
  Filled 2022-05-28 (×3): qty 1

## 2022-05-28 MED ORDER — ROCURONIUM BROMIDE 10 MG/ML (PF) SYRINGE
PREFILLED_SYRINGE | INTRAVENOUS | Status: DC | PRN
  Administered 2022-05-28: 10 mg via INTRAVENOUS
  Administered 2022-05-28: 70 mg via INTRAVENOUS
  Administered 2022-05-28: 10 mg via INTRAVENOUS
  Administered 2022-05-28: 20 mg via INTRAVENOUS

## 2022-05-28 MED ORDER — SUGAMMADEX SODIUM 200 MG/2ML IV SOLN
INTRAVENOUS | Status: DC | PRN
  Administered 2022-05-28: 160 mg via INTRAVENOUS

## 2022-05-28 MED ORDER — BENAZEPRIL HCL 10 MG PO TABS
10.0000 mg | ORAL_TABLET | Freq: Every day | ORAL | Status: DC
  Administered 2022-05-29: 10 mg via ORAL
  Filled 2022-05-28: qty 1

## 2022-05-28 MED ORDER — HYDROMORPHONE HCL 1 MG/ML IJ SOLN
INTRAMUSCULAR | Status: AC
  Filled 2022-05-28: qty 1

## 2022-05-28 MED ORDER — BUPIVACAINE LIPOSOME 1.3 % IJ SUSP
INTRAMUSCULAR | Status: AC
  Filled 2022-05-28: qty 20

## 2022-05-28 MED ORDER — CEFAZOLIN SODIUM-DEXTROSE 2-4 GM/100ML-% IV SOLN
2.0000 g | INTRAVENOUS | Status: AC
  Administered 2022-05-28: 2 g via INTRAVENOUS
  Filled 2022-05-28: qty 100

## 2022-05-28 MED ORDER — ACETAMINOPHEN 10 MG/ML IV SOLN
1000.0000 mg | Freq: Once | INTRAVENOUS | Status: DC | PRN
  Administered 2022-05-28: 1000 mg via INTRAVENOUS

## 2022-05-28 MED ORDER — PHENYLEPHRINE 80 MCG/ML (10ML) SYRINGE FOR IV PUSH (FOR BLOOD PRESSURE SUPPORT)
PREFILLED_SYRINGE | INTRAVENOUS | Status: DC | PRN
  Administered 2022-05-28: 40 ug via INTRAVENOUS
  Administered 2022-05-28 (×2): 120 ug via INTRAVENOUS
  Administered 2022-05-28: 40 ug via INTRAVENOUS

## 2022-05-28 MED ORDER — ACETAMINOPHEN 325 MG PO TABS
650.0000 mg | ORAL_TABLET | Freq: Four times a day (QID) | ORAL | Status: DC
  Administered 2022-05-28 – 2022-05-29 (×2): 650 mg via ORAL
  Administered 2022-05-29: 325 mg via ORAL
  Filled 2022-05-28 (×4): qty 2

## 2022-05-28 MED ORDER — KETOROLAC TROMETHAMINE 30 MG/ML IJ SOLN
INTRAMUSCULAR | Status: AC
  Administered 2022-05-28: 15 mg
  Filled 2022-05-28: qty 1

## 2022-05-28 MED ORDER — BUPIVACAINE-EPINEPHRINE 0.5% -1:200000 IJ SOLN
INTRAMUSCULAR | Status: DC | PRN
  Administered 2022-05-28: 30 mL

## 2022-05-28 MED ORDER — AMISULPRIDE (ANTIEMETIC) 5 MG/2ML IV SOLN
10.0000 mg | Freq: Once | INTRAVENOUS | Status: AC
Start: 2022-05-28 — End: 2022-05-28
  Administered 2022-05-28: 10 mg via INTRAVENOUS

## 2022-05-28 MED ORDER — ALBUMIN HUMAN 5 % IV SOLN: INTRAVENOUS | Status: DC | PRN

## 2022-05-28 MED ORDER — AMISULPRIDE (ANTIEMETIC) 5 MG/2ML IV SOLN
INTRAVENOUS | Status: AC
  Filled 2022-05-28: qty 4

## 2022-05-28 SURGICAL SUPPLY — 74 items
BAG COUNTER SPONGE SURGICOUNT (BAG) IMPLANT
BAG URINE DRAIN 2000ML AR STRL (UROLOGICAL SUPPLIES) IMPLANT
CATH FOLEY 2WAY SLVR  5CC 16FR (CATHETERS) ×1
CATH FOLEY 2WAY SLVR 5CC 16FR (CATHETERS) ×1 IMPLANT
CHLORAPREP W/TINT 26 (MISCELLANEOUS) ×1 IMPLANT
CLIP LIGATING HEM O LOK PURPLE (MISCELLANEOUS) ×1 IMPLANT
CLIP LIGATING HEMO LOK XL GOLD (MISCELLANEOUS) IMPLANT
CLIP LIGATING HEMO O LOK GREEN (MISCELLANEOUS) IMPLANT
COVER BACK TABLE 60X90IN (DRAPES) ×1 IMPLANT
COVER SURGICAL LIGHT HANDLE (MISCELLANEOUS) ×1 IMPLANT
COVER TIP SHEARS 8 DVNC (MISCELLANEOUS) ×1 IMPLANT
COVER TIP SHEARS 8MM DA VINCI (MISCELLANEOUS) ×1
DERMABOND ADVANCED (GAUZE/BANDAGES/DRESSINGS) ×1
DERMABOND ADVANCED .7 DNX12 (GAUZE/BANDAGES/DRESSINGS) ×1 IMPLANT
DRAIN CHANNEL RND F F (WOUND CARE) IMPLANT
DRAPE ARM DVNC X/XI (DISPOSABLE) ×4 IMPLANT
DRAPE COLUMN DVNC XI (DISPOSABLE) ×1 IMPLANT
DRAPE DA VINCI XI ARM (DISPOSABLE) ×4
DRAPE DA VINCI XI COLUMN (DISPOSABLE) ×1
DRAPE INCISE IOBAN 66X45 STRL (DRAPES) ×1 IMPLANT
DRAPE SHEET LG 3/4 BI-LAMINATE (DRAPES) ×2 IMPLANT
DRAPE SURG IRRIG POUCH 19X23 (DRAPES) ×1 IMPLANT
ELECT PENCIL ROCKER SW 15FT (MISCELLANEOUS) ×1 IMPLANT
ELECT REM PT RETURN 15FT ADLT (MISCELLANEOUS) ×1 IMPLANT
GAUZE 4X4 16PLY ~~LOC~~+RFID DBL (SPONGE) IMPLANT
GLOVE BIO SURGEON STRL SZ 6.5 (GLOVE) ×1 IMPLANT
GLOVE SURG LX 7.5 STRW (GLOVE) ×3
GLOVE SURG LX STRL 7.5 STRW (GLOVE) ×3 IMPLANT
GOWN SRG XL LVL 4 BRTHBL STRL (GOWNS) ×1 IMPLANT
GOWN STRL NON-REIN XL LVL4 (GOWNS) ×1
GOWN STRL REUS W/ TWL XL LVL3 (GOWN DISPOSABLE) ×2 IMPLANT
GOWN STRL REUS W/TWL XL LVL3 (GOWN DISPOSABLE) ×4
HOLDER FOLEY CATH W/STRAP (MISCELLANEOUS) ×1 IMPLANT
IRRIG SUCT STRYKERFLOW 2 WTIP (MISCELLANEOUS) ×1
IRRIGATION SUCT STRKRFLW 2 WTP (MISCELLANEOUS) ×1 IMPLANT
KIT BASIN OR (CUSTOM PROCEDURE TRAY) ×1 IMPLANT
KIT TURNOVER KIT A (KITS) IMPLANT
MANIPULATOR UTERINE 4.5 ZUMI (MISCELLANEOUS) IMPLANT
MARKER SKIN DUAL TIP RULER LAB (MISCELLANEOUS) ×1 IMPLANT
MESH Y UPSYLON VAGINAL (Mesh General) IMPLANT
OCCLUDER COLPOPNEUMO (BALLOONS) IMPLANT
PACKING VAGINAL (PACKING) IMPLANT
PAD OB MATERNITY 4.3X12.25 (PERSONAL CARE ITEMS) IMPLANT
PAD POSITIONING PINK XL (MISCELLANEOUS) ×1 IMPLANT
PANTS MESH DISP LRG (UNDERPADS AND DIAPERS) IMPLANT
PLUG CATH AND CAP STER (CATHETERS) IMPLANT
SEAL CANN UNIV 5-8 DVNC XI (MISCELLANEOUS) ×4 IMPLANT
SEAL XI 5MM-8MM UNIVERSAL (MISCELLANEOUS) ×4
SET IRRIG Y TYPE TUR BLADDER L (SET/KITS/TRAYS/PACK) IMPLANT
SET TUBE SMOKE EVAC HIGH FLOW (TUBING) ×1 IMPLANT
SHEET LAVH (DRAPES) ×1 IMPLANT
SOL PREP POV-IOD 4OZ 10% (MISCELLANEOUS) IMPLANT
SOLUTION ELECTROLUBE (MISCELLANEOUS) ×1 IMPLANT
SURGILUBE 2OZ TUBE FLIPTOP (MISCELLANEOUS) IMPLANT
SUT MNCRL AB 4-0 PS2 18 (SUTURE) ×2 IMPLANT
SUT PROLENE 2 0 CT 1 (SUTURE) ×1 IMPLANT
SUT VIC AB 0 CT1 27 (SUTURE) ×1
SUT VIC AB 0 CT1 27XBRD ANTBC (SUTURE) ×1 IMPLANT
SUT VIC AB 2-0 SH 27 (SUTURE) ×6
SUT VIC AB 2-0 SH 27XBRD (SUTURE) ×5 IMPLANT
SUT VIC AB 3-0 SH 27 (SUTURE) ×1
SUT VIC AB 3-0 SH 27X BRD (SUTURE) IMPLANT
SUT VICRYL 0 UR6 27IN ABS (SUTURE) ×1 IMPLANT
SUT VLOC 180 3-0 9IN GS21 (SUTURE) IMPLANT
SYR 20ML LL LF (SYRINGE) IMPLANT
SYR 50ML LL SCALE MARK (SYRINGE) IMPLANT
SYR BULB IRRIG 60ML STRL (SYRINGE) IMPLANT
SYS BAG RETRIEVAL 10MM (BASKET)
SYSTEM BAG RETRIEVAL 10MM (BASKET) IMPLANT
TOWEL OR 17X26 10 PK STRL BLUE (TOWEL DISPOSABLE) ×1 IMPLANT
TRAY LAPAROSCOPIC (CUSTOM PROCEDURE TRAY) ×1 IMPLANT
TROCAR ADV FIXATION 12X100MM (TROCAR) ×1 IMPLANT
TROCAR Z-THREAD FIOS 5X100MM (TROCAR) ×1 IMPLANT
WATER STERILE IRR 1000ML POUR (IV SOLUTION) ×1 IMPLANT

## 2022-05-28 NOTE — Op Note (Signed)
Preoperative diagnosis:  Pelvic organ prolapse   Postoperative diagnosis:  same   Procedure: Robotic assisted laparoscopic sacrocolpopexy  Surgeon: Ardis Hughs, MD 1st assistant: Hinton Rao, MD  Anesthesia: General  Complications: Small hole at the vaginal apex was closed with 3-0 vicryl in two layers.  Intraoperative findings: [name] placed, cut to specific vaginal length  EBL: 50cc  Specimens: None  Indication: Tara Floyd is a 76 y.o. female patient with symptomatic pelvic organ prolapse.  After reviewing the management options for treatment, he elected to proceed with the above surgical procedure(s). We have discussed the potential benefits and risks of the procedure, side effects of the proposed treatment, the likelihood of the patient achieving the goals of the procedure, and any potential problems that might occur during the procedure or recuperation. Informed consent has been obtained.  Description of procedure:  A Foley catheter was then placed and placed to gravity drainage. I then made a periumbilical incision carrying the dissection down to the patient's fascia with electrocautery.  Once to the fascia, the fascia was incised the peritoneum opened.  A 89m port was then placed into the abdomen.  The abdomen was insufflated and the remaining ports placed under digital guidance.  2 ports were placed lateral to the umbilicus on the right proximally 10 cm apart.  The most lateral port was approximately 3 cm above the anterior iliac spine.  2 additional ports were placed in the patient's right side in comparable positions to the most lateral port on the right was a 12 mm port.the robot was then docked at an angle from the leg obliquely along the side of the left leg.  We then began our surgery by cleaning up some of the pelvic adhesions to the small bowel and colon.  Once this was completed I started dissecting at the sacral promontory located 3 cm  medial to the location where the ureter crosses over the iliac vessels at the pelvic brim. The posterior peritoneum was incised and the sacral prominence cleared off an area taking care to avoid the middle sacral vessels and the iliac branches.  I then created a posterior peritoneal tunnel starting at the sacral promontory and tunneling down the right pelvic sidewall down into the pelvis breaking back through the posterior peritoneum around the vesico-vaginal junction posteriorly.  I then continued the posterior dissection retracting down on the rectum and finding the avascular plane between the posterior vaginal wall and the rectum.  I then turned my attention to the anterior plane between the anterior vaginal wall and the bladder.  I was able to obtain access to the avascular plane and with a combination of both monopolar cautery and blunt dissection was able to clean and nice down to the bladder neck. Small hole at the vaginal apex was closed with 3-0 vicryl in two layers.  Mesh was measured at approximately 7cm anteriorly and 7 cm posteriorly and I cut this on the back table.  The mesh was then placed into the patient's abdomen through the assistant port and the anterior leaf was secured down onto the anterior vaginal wall with the apex at the bladder neck.  The posterior leaf was then secured down on the posterior vaginal wall.  These were sewn down with 2-0 Vicryl.  Between 6 and 8 were done on each side.  At this point I then went back to the previously dissected sacral promontory and posterior peritoneal tunnel and inserted a instrument through the tunnel and  grasped the end of the mesh at the vaginal cuff and pull it up to the sacrum.  I then checked to ensure that the sacral mesh was not too tight by performing a vaginal exam.  I then secured the sacral leg of the mesh using a 0 Prolene.  I then reapproximated the posterior peritoneum with a 2-0 Vicryl in a running fashion around the sacral promontory.   The pelvic peritoneum was closed using a pursestring.   The fascia of the 12 mm port was then closed with 0 Vicryl in a figure-of-eight fashion.  The skin was closed with 4-0 Monocryl's.  Dermabond was applied to the incision and exparel injected into the incisions.  Estrace impregnated packing was then placed into her vagina which will be left in overnight.  The patient was subsequently extubated and returned to the PACU in excellent condition.   Ardis Hughs, M.D.

## 2022-05-28 NOTE — Progress Notes (Signed)
  Transition of Care [facility]) Screening Note   Patient Details  Name: Tara Floyd Date of Birth: Apr 11, 1946   Transition of Care [facility]) CM/SW Contact:    Dessa Phi, RN Phone Number: 05/28/2022, 3:54 PM    Transition of Care Department [facility]) has reviewed patient and no TOC needs have been identified at this time. We will continue to monitor patient advancement through interdisciplinary progression rounds. If new patient transition needs arise, please place a TOC consult.

## 2022-05-28 NOTE — Anesthesia Postprocedure Evaluation (Signed)
Anesthesia Post Note  Patient: Tara Floyd  Procedure(s) Performed: XI ROBOTIC ASSISTED LAPAROSCOPIC SACROCOLPOPEXY     Patient location during evaluation: PACU Anesthesia Type: General Level of consciousness: awake and alert Pain management: pain level controlled Vital Signs Assessment: post-procedure vital signs reviewed and stable Respiratory status: spontaneous breathing, nonlabored ventilation, respiratory function stable and patient connected to nasal cannula oxygen Cardiovascular status: blood pressure returned to baseline and stable Postop Assessment: no apparent nausea or vomiting Anesthetic complications: no   No notable events documented.  Last Vitals:  Vitals:   05/28/22 1200 05/28/22 1215  BP: 112/76 (!) 117/57  Pulse: 78 80  Resp: 19 15  Temp:  36.5 C  SpO2: 98% 100%    Last Pain:  Vitals:   05/28/22 1200  TempSrc:   PainSc: Asleep                 [name]

## 2022-05-28 NOTE — Anesthesia Procedure Notes (Signed)
Procedure Name: Intubation Date/Time: 05/28/2022 7:42 AM  Performed by: Eben Burow, CRNAPre-anesthesia Checklist: Patient identified, Emergency Drugs available, Suction available, Patient being monitored and Timeout performed Patient Re-evaluated:Patient Re-evaluated prior to induction Oxygen Delivery Method: Circle system utilized Preoxygenation: Pre-oxygenation with 100% oxygen Induction Type: IV induction Ventilation: Mask ventilation without difficulty Laryngoscope Size: Mac and 4 Grade View: Grade I Tube type: Oral Tube size: 7.0 mm Number of attempts: 1 Airway Equipment and Method: Stylet Placement Confirmation: ETT inserted through vocal cords under direct vision, positive ETCO2 and breath sounds checked- equal and bilateral Secured at: 21 cm Tube secured with: Tape Dental Injury: Teeth and Oropharynx as per pre-operative assessment

## 2022-05-28 NOTE — Interval H&P Note (Signed)
History and Physical Interval Note:  05/28/2022 7:25 AM  Tara Floyd  has presented today for surgery, with the diagnosis of CYSTOCELE.  The various methods of treatment have been discussed with the patient and family. After consideration of risks, benefits and other options for treatment, the patient has consented to  Procedure(s): XI ROBOTIC ASSISTED LAPAROSCOPIC SACROCOLPOPEXY (N/A) as a surgical intervention.  The patient's history has been reviewed, patient examined, no change in status, stable for surgery.  I have reviewed the patient's chart and labs.  Questions were answered to the patient's satisfaction.     Ardis Hughs

## 2022-05-28 NOTE — H&P (Signed)
76 year old female presents today for further discussion of sacrocolpopexy. The patient for started noting prolapse type symptoms after she underwent a hysterectomy in 2020. Over the next 3 years she has noted progressive sagging or dropping of her bladder. She is having significant discomfort. She is stopped working out doing anything requiring any physical labor or exercise. She is noted that she has overactive bladder type symptoms. She denies any history of stress associated urinary incontinence. She is also not really complaining of any urge associated incontinence. She has been evaluated and treated for her prolapse symptoms by Dr. Matilde Sprang. He referred her here after she has completed urodynamics.   Her urodynamics demonstrated a fairly normal capacity bladder with an overactive component. She did have what appeared to be some mild stress incontinence, but I think this is more likely urge associated incontinence.   The patient has a past surgical history of cholecystectomy in 2020 followed by hysterectomy in 2021.   The patient's past medical history is significant for asymptomatic A-fib. She does take Eliquis for this, but at a small dose.   The patient is married and lives lives independently with her husband. She walks several miles a day. She is very active in otherwise good shape.     ALLERGIES: Codine    MEDICATIONS: Omeprazole 40 mg capsule,delayed release  Advil  Benazepril Hcl 20 mg tablet  Cardizem  Eliquis 2.5 mg tablet  Singulair 10 mg tablet  Xanax 0.5 mg tablet     GU PSH: Complex cystometrogram, w/ void pressure and urethral pressure profile studies, any technique - 07/10/2020 Complex Uroflow - 07/10/2020 Emg surf Electrd - 07/10/2020 Inject For cystogram - 07/10/2020 Intrabd voidng Press - 07/10/2020       PSH Notes: Gallbladder2019, Hysterectomy 2020, Wrist Surgery 2021   NON-GU PSH: No Non-GU PSH    GU PMH: Cystocele, midline - 02/04/2022, - 07/12/2020, -  07/10/2020, - 07/02/2020 Urinary Frequency - 02/04/2022, - 07/12/2020, - 07/10/2020, - 07/02/2020 Nocturia - 07/10/2020, - 07/02/2020 Urge incontinence - 07/10/2020, - 07/02/2020    NON-GU PMH: Atrial Fibrillation    FAMILY HISTORY: 1 Daughter - Daughter 1 son - Son   SOCIAL HISTORY: Marital Status: Married Current Smoking Status: Patient does not smoke anymore. Has not smoked since 06/13/1999. Smoked 1/2 pack per day.   Tobacco Use Assessment Completed: Used Tobacco in last 30 days? Has never drank.  Drinks 1 caffeinated drink per day.    REVIEW OF SYSTEMS:    GU Review Female:   Patient reports hard to postpone urination. Patient denies frequent urination, burning /pain with urination, get up at night to urinate, leakage of urine, stream starts and stops, trouble starting your stream, have to strain to urinate, and being pregnant.  Gastrointestinal (Upper):   Patient denies nausea, indigestion/ heartburn, and vomiting.  Gastrointestinal (Lower):   Patient denies diarrhea and constipation.  Constitutional:   Patient denies fever, night sweats, weight loss, and fatigue.  Skin:   Patient denies skin rash/ lesion and itching.  Eyes:   Patient denies blurred vision and double vision.  Ears/ Nose/ Throat:   Patient denies sore throat and sinus problems.  Hematologic/Lymphatic:   Patient denies swollen glands and easy bruising.  Cardiovascular:   Patient denies leg swelling and chest pains.  Respiratory:   Patient denies cough and shortness of breath.  Endocrine:   Patient denies excessive thirst.  Musculoskeletal:   Patient denies back pain and joint pain.  Neurological:   Patient denies headaches and dizziness.  Psychologic:   Patient denies depression and anxiety.   VITAL SIGNS: None   MULTI-SYSTEM PHYSICAL EXAMINATION:    Constitutional: Well-nourished. No physical deformities. Normally developed. Good grooming.  Respiratory: Normal breath sounds. No labored breathing, no use of accessory  muscles.   Cardiovascular: Regular rate and rhythm. No murmur, no gallop. Normal temperature, normal extremity pulses, no swelling, no varicosities.      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 I explained to the patient the rationale for the surgery. I also went over the placement of the laparoscopic ports. I detailed to her the surgery as well as the postoperative recovery time. I explained to the patient that she could expect to be in the hospital at least one or 2 nights. She will require 4 weeks of no heavy lifting, 6 weeks of no bending or twisting. She will not be able to use her vagina for 6 weeks. I discussed complications of the operation including injury to bowel, ureters, bladder. We also discussed the risk of failure as well as the complications of mesh. I explained to them the difference between transvaginal mesh and the mesh used for sacral colpopexy. I reassured them that there has not been an FDA warnings in regards to sacral colpopexy mesh. We will plan to get this prior to her surgery. I spent 45 minutes with the patient going over that ins and outs of the surgery and answering all her questions.     The patient does not need a sling today.

## 2022-05-28 NOTE — Transfer of Care (Signed)
Immediate Anesthesia Transfer of Care Note  Patient: NOREEN MACKINTOSH  Procedure(s) Performed: XI ROBOTIC ASSISTED LAPAROSCOPIC SACROCOLPOPEXY  Patient Location: PACU  Anesthesia Type:General  Level of Consciousness: awake, alert  and patient cooperative  Airway & Oxygen Therapy: Patient Spontanous Breathing and Patient connected to face mask oxygen  Post-op Assessment: Report given to RN and Post -op Vital signs reviewed and stable  Post vital signs: Reviewed and stable  Last Vitals:  Vitals Value Taken Time  BP 124/64 05/28/22 1157  Temp    Pulse 95 05/28/22 1159  Resp 19 05/28/22 1159  SpO2 96 % 05/28/22 1159  Vitals shown include unvalidated device data.  Last Pain:  Vitals:   05/28/22 0548  TempSrc:   PainSc: 0-No pain         Complications: No notable events documented.

## 2022-05-28 NOTE — Discharge Instructions (Signed)

## 2022-05-29 DIAGNOSIS — N8189 Other female genital prolapse: Secondary | ICD-10-CM | POA: Diagnosis not present

## 2022-05-29 LAB — HEMOGLOBIN AND HEMATOCRIT, BLOOD
HCT: 37.3 % (ref 36.0–46.0)
Hemoglobin: 12.4 g/dL (ref 12.0–15.0)

## 2022-05-29 LAB — BASIC METABOLIC PANEL
Anion gap: 7 (ref 5–15)
BUN: 13 mg/dL (ref 8–23)
CO2: 26 mmol/L (ref 22–32)
Calcium: 9 mg/dL (ref 8.9–10.3)
Chloride: 107 mmol/L (ref 98–111)
Creatinine, Ser: 0.94 mg/dL (ref 0.44–1.00)
GFR, Estimated: >60 mL/min (ref >60)
Glucose, Bld: 117 mg/dL — ABNORMAL HIGH (ref 70–99)
Potassium: 4 mmol/L (ref 3.5–5.1)
Sodium: 140 mmol/L (ref 135–145)

## 2022-05-29 NOTE — Discharge Summary (Signed)
[facility] Discharge Summary  Admit date: 05/28/2022  Discharge date and time: 05/29/22   Discharge to: Home  Discharge Service: Urology  Discharge Attending Physician:  Dr. Louis Meckel  Discharge  Diagnoses: Prolapse of female pelvic organs  Secondary Diagnosis: Principal Problem:   Prolapse of female pelvic organs   OR Procedures: Procedure(s): XI ROBOTIC ASSISTED LAPAROSCOPIC SACROCOLPOPEXY 05/28/2022   Ancillary Procedures: None   Discharge Day Services: The patient was seen and examined by the Urology team both in the morning and immediately prior to discharge.  Vital signs and laboratory values were stable and within normal limits.  The physical exam was benign and unchanged and all surgical wounds were examined.  Discharge instructions were explained and all questions answered.  Subjective  No acute events overnight. Pain Controlled. No fever or chills.  Objective Patient Vitals for the past 8 hrs:  BP Temp Temp src Pulse Resp SpO2  05/29/22 0521 108/70 97.9 F (36.6 C) Oral 89 18 93 %  05/29/22 0008 102/65 98.1 F (36.7 C) Oral 89 18 93 %   No intake/output data recorded.  General Appearance:        No acute distress Lungs:                       Normal work of breathing on room air Heart:                                Regular rate and rhythm Abdomen:                         Soft, non-tender, non-distended Extremities:                      Warm and well perfused   Hospital Course:  The patient underwent robotic assisted sacrocolpopexy on 05/28/2022.  The patient tolerated the procedure well, was extubated in the OR, and afterwards was taken to the PACU for routine post-surgical care. When stable the patient was transferred to the floor.   The patient did well postoperatively.  The patient's diet was slowly advanced and at the time of discharge was tolerating a regular diet.  The patient was discharged home 1 Day Post-Op, at which point was tolerating a regular  solid diet, was able to void spontaneously, have adequate pain control with P.O. pain medication, and could ambulate without difficulty. The vaginal packing was removed. The patient will follow up with [place] for post op check.   Condition at Discharge: Improved  Discharge Medications:  Allergies as of 05/29/2022       Reactions   Codeine Phosphate Other (See Comments)   "knocks me out"        Medication List     STOP taking these medications    apixaban 2.5 MG Tabs tablet Commonly known as: ELIQUIS       TAKE these medications    acetaminophen 325 MG tablet Commonly known as: TYLENOL Take 650 mg by mouth every 6 (six) hours as needed for moderate pain.   ALPRAZolam 0.5 MG tablet Commonly known as: XANAX Take 0.5 mg by mouth 3 (three) times daily as needed for anxiety.   benazepril 10 MG tablet Commonly known as: LOTENSIN Take 1 tablet (10 mg total) by mouth daily.   diclofenac sodium 1 % Gel Commonly known as: VOLTAREN Apply 1 application topically 2 (two) times  daily as needed (pain).   diltiazem 120 MG 24 hr capsule Commonly known as: CARDIZEM CD Take 1 capsule (120 mg total) by mouth daily.   estradiol 0.1 MG/GM vaginal cream Commonly known as: ESTRACE VAGINAL Place 1 Applicatorful vaginally 3 (three) times a week. Use 1 small dolyp of cream on tip of index finger and swap the inside of the vagina   loratadine 10 MG tablet Commonly known as: CLARITIN Take 10 mg by mouth daily as needed for allergies.   omeprazole 40 MG capsule Commonly known as: PRILOSEC Take 40 mg by mouth daily.   sulfamethoxazole-trimethoprim 800-160 MG tablet Commonly known as: BACTRIM DS Take 1 tablet by mouth 2 (two) times daily.   Systane Complete 0.6 % Soln Generic drug: Propylene Glycol Place 1 drop into both eyes as needed (dry/irritated eyes).   traMADol 50 MG tablet Commonly known as: Ultram Take 1-2 tablets (50-100 mg total) by mouth every 6 (six) hours as needed for  moderate pain.

## 2022-05-29 NOTE — TOC Initial Note (Signed)
Transition of Care [facility]) - Initial/Assessment Note    Patient Details  Name: LEKEYA ROLLINGS MRN: [id number] Date of Birth: [birth year]  Transition of [facility]) CM/SW Contact:    Leeroy Cha, RN Phone Number: 05/29/2022, 8:35 AM  Clinical Narrative:                 Patient dcd to return to home with husband.  No toc needs present.  Expected Discharge Plan: Home/Self Care Barriers to Discharge: No Barriers Identified   Patient Goals and CMS Choice Patient states their goals for this hospitalization and ongoing recovery are:: to go home CMS Medicare.gov Compare Post Acute Care list provided to:: Patient Choice offered to / list presented to : Patient  Expected Discharge Plan and Services Expected Discharge Plan: Home/Self Care   Discharge Planning Services: CM Consult   Living arrangements for the past 2 months: Single Family Home Expected Discharge Date: 05/29/22                                    Prior Living Arrangements/Services Living arrangements for the past 2 months: Single Family Home Lives with:: Spouse Patient language and need for interpreter reviewed:: Yes Do you feel safe going back to the place where you live?: Yes               Activities of Daily Living Home Assistive Devices/Equipment: Eyeglasses ADL Screening (condition at time of admission) Patient's cognitive ability adequate to safely complete daily activities?: Yes Is the patient deaf or have difficulty hearing?: No Does the patient have difficulty seeing, even when wearing glasses/contacts?: No Does the patient have difficulty concentrating, remembering, or making decisions?: No Patient able to express need for assistance with ADLs?: Yes Does the patient have difficulty dressing or bathing?: No Independently performs ADLs?: Yes (appropriate for developmental age) Does the patient have difficulty walking or climbing stairs?: No Weakness of Legs: None Weakness of Arms/Hands:  None  Permission Sought/Granted                  Emotional Assessment Appearance:: Appears stated age Attitude/Demeanor/Rapport: Engaged Affect (typically observed): Calm Orientation: : Oriented to Self, Oriented to Place, Oriented to Situation, Oriented to  Time Alcohol / Substance Use: Not Applicable Psych Involvement: No (comment)  Admission diagnosis:  Prolapse of female pelvic organs [N81.9] Patient Active Problem List   Diagnosis Date Noted   Prolapse of female pelvic organs 05/28/2022   Special screening for malignant neoplasms, colon 06/12/2019   Postoperative ileus ([place]) 05/20/2018   Acute hypoxemic respiratory failure ([place]) 05/20/2018   Acute cholecystitis 05/17/2018   Persistent atrial fibrillation ([place]) 05/17/2018   PAF (paroxysmal atrial fibrillation) ([place]) 03/13/2014   Essential hypertension, benign 02/08/2014   PCP:  Adaline Sill, NP Pharmacy:   [place], [address] [place] [address] Phone: [phone] Fax: [phone]     Social Determinants of Health (SDOH) Interventions    Readmission Risk Interventions     No data to display

## 2022-06-01 ENCOUNTER — Encounter (HOSPITAL_COMMUNITY): Payer: Self-pay | Admitting: Urology

## 2022-06-01 ENCOUNTER — Telehealth: Payer: Self-pay | Admitting: Cardiology

## 2022-06-01 NOTE — Progress Notes (Unsigned)
Office Visit    Patient Name: Tara Floyd Date of Encounter: 06/02/2022  Primary Care Provider:  Adaline Sill, NP Primary Cardiologist:  Rozann Lesches, MD Primary Electrophysiologist: None  Chief Complaint    Tara Floyd is a 76 y.o. female with PMH of persistent atrial fibrillation (on Eliquis), HTN who presents today for follow-up of recent episode of AF with RVR.  Past Medical History    Past Medical History:  Diagnosis Date   Allergic rhinitis    Depression    DJD (degenerative joint disease)    Essential hypertension    GERD (gastroesophageal reflux disease)    PAF (paroxysmal atrial fibrillation) ([place])    Past Surgical History:  Procedure Laterality Date   CHOLECYSTECTOMY N/A 05/18/2018   Procedure: LAPAROSCOPIC CHOLECYSTECTOMY;  Surgeon: Virl Cagey, MD;  Location: [facility] ORS;  Service: General;  Laterality: N/A;   COLONOSCOPY N/A 09/27/2019   Procedure: COLONOSCOPY;  Surgeon: Rogene Houston, MD;  Location: [facility] ENDO SUITE;  Service: Endoscopy;  Laterality: N/A;  [place]     ROBOTIC ASSISTED LAPAROSCOPIC SACROCOLPOPEXY N/A 05/28/2022   Procedure: XI ROBOTIC ASSISTED LAPAROSCOPIC SACROCOLPOPEXY;  Surgeon: Ardis Hughs, MD;  Location: [facility] ORS;  Service: Urology;  Laterality: N/A;   TUBAL LIGATION     VAGINAL HYSTERECTOMY  08/22/2019    Allergies  Allergies  Allergen Reactions   Codeine Phosphate Other (See Comments)    "knocks me out"    History of Present Illness    Tara Floyd is a 76 year old female with the above-mentioned past medical history who presents today for episode of AF with RVR.  Tara Floyd has been followed by Dr. Domenic Polite since 2015 when she was referred for management of hypertension.  She was evaluated for atrial fibrillation in 2019 and patient was managed with Toprol-XL and Eliquis 5 mg.  She subsequently stopped anticoagulation and was started on ASA 81 mg.  She maintained rate control with  Toprol XL 50 mg.  She was subsequently moved to Cardizem for rate control and benazepril for BP control. She was last seen by Dr. Domenic Polite in 07/2021 for follow-up.  She was reportedly doing well with no reports of palpitations.  She decided to restart DOAC and is currently on Eliquis 2.5 mg twice daily.  She contacted our office on 06/01/2022 with complaint of atrial fibrillation with rate of 128 bpm following treatment with sulfa antibiotics following sacral colposcopy procedure.  Tara Floyd presents today for complaint of atrial fibrillation.  She is here alone today for her EKG was completed today that does show patient is currently in A-fib with controlled rate of 94 bpm.  She reports being asymptomatic when previous event occurred.  She is currently recovering from recent urologic procedure that occurred this weekend.  She is compliant with her medications and denies any dietary indiscretions with caffeine, chocolate or energy drinks.  She is euvolemic on examination today and denies any shortness of breath or chest pain.  Patient denies dyspnea, PND, orthopnea, nausea, vomiting, dizziness, syncope, edema, weight gain, or early satiety.  Home Medications    Current Outpatient Medications  Medication Sig Dispense Refill   metoprolol succinate (TOPROL XL) 25 MG 24 hr tablet Take 0.5 tablets (12.5 mg total) by mouth as needed (Heart rates higher than 100). 45 tablet 3   acetaminophen (TYLENOL) 325 MG tablet Take 650 mg by mouth every 6 (six) hours as needed for moderate pain.  ALPRAZolam (XANAX) 0.5 MG tablet Take 0.5 mg by mouth 3 (three) times daily as needed for anxiety.     apixaban (ELIQUIS) 2.5 MG TABS tablet Take 2.5 mg by mouth 2 (two) times daily.     benazepril (LOTENSIN) 10 MG tablet Take 1 tablet (10 mg total) by mouth daily. 90 tablet 3   diclofenac sodium (VOLTAREN) 1 % GEL Apply 1 application topically 2 (two) times daily as needed (pain).      diltiazem (CARDIZEM CD) 120 MG 24 hr  capsule Take 1 capsule (120 mg total) by mouth daily. 90 capsule 3   estradiol (ESTRACE VAGINAL) 0.1 MG/GM vaginal cream Place 1 Applicatorful vaginally 3 (three) times a week. Use 1 small dolyp of cream on tip of index finger and swap the inside of the vagina 42.5 g 1   loratadine (CLARITIN) 10 MG tablet Take 10 mg by mouth daily as needed for allergies.     omeprazole (PRILOSEC) 40 MG capsule Take 40 mg by mouth daily.     Propylene Glycol (SYSTANE COMPLETE) 0.6 % SOLN Place 1 drop into both eyes as needed (dry/irritated eyes).     sulfamethoxazole-trimethoprim (BACTRIM DS) 800-160 MG tablet Take 1 tablet by mouth 2 (two) times daily. 6 tablet 0   traMADol (ULTRAM) 50 MG tablet Take 1-2 tablets (50-100 mg total) by mouth every 6 (six) hours as needed for moderate pain. 15 tablet 0   No current facility-administered medications for this visit.     Review of Systems  Please see the history of present illness.    (+) Palpitations All other systems reviewed and are otherwise negative except as noted above.  Physical Exam    Wt Readings from Last 3 Encounters:  06/02/22 171 lb (77.6 kg)  05/28/22 166 lb 7.2 oz (75.5 kg)  05/18/22 166 lb 6.4 oz (75.5 kg)   VS: Vitals:   06/02/22 1136  BP: 128/80  Pulse: 98  SpO2: 98%  ,Body mass index is 31.28 kg/m.  Constitutional:      Appearance: Healthy appearance. Not in distress.  Neck:     Vascular: JVD normal.  Pulmonary:     Effort: Pulmonary effort is normal.     Breath sounds: No wheezing. No rales. Diminished in the bases Cardiovascular:     Irregularly irregular. Normal S1. Normal S2.      Murmurs: There is no murmur.  Edema:    Peripheral edema absent.  Abdominal:     Palpations: Abdomen is soft non tender. There is no hepatomegaly.  Skin:    General: Skin is warm and dry.  Neurological:     General: No focal deficit present.     Mental Status: Alert and oriented to person, place and time.     Cranial Nerves: Cranial  nerves are intact.  EKG/LABS/Other Studies Reviewed    ECG personally reviewed by me today -atrial fibrillation with rate of 94 and left axis deviation with no acute changes  Risk Assessment/Calculations:    CHA2DS2-VASc Score = 4   This indicates a 4.8% annual risk of stroke. The patient's score is based upon: CHF History: 0 HTN History: 1 Diabetes History: 0 Stroke History: 0 Vascular Disease History: 0 Age Score: 2 Gender Score: 1     Lab Results  Component Value Date   WBC 6.2 05/18/2022   HGB 12.4 05/29/2022   HCT 37.3 05/29/2022   MCV 92.6 05/18/2022   PLT 231 05/18/2022   Lab Results  Component Value Date  CREATININE 0.94 05/29/2022   BUN 13 05/29/2022   NA 140 05/29/2022   K 4.0 05/29/2022   CL 107 05/29/2022   CO2 26 05/29/2022   Lab Results  Component Value Date   ALT 14 05/18/2022   AST 20 05/18/2022   ALKPHOS 49 05/18/2022   BILITOT 0.5 05/18/2022   No results found for: "CHOL", "HDL", "LDLCALC", "LDLDIRECT", "TRIG", "CHOLHDL"  No results found for: "HGBA1C"  Assessment & Plan    1.  Paroxysmal atrial fibrillation: -Patient experienced bout of AF with RVR over the weekend after taking antibiotic therapy. -EKG completed today with patient currently asymptomatic in atrial fibrillation -Patient reports compliance with Eliquis 2.5 mg twice daily -Patient reports no bleeding issues or concerns with NOAC -We will order 14-day ZIO monitor to evaluate AF burden -Patient instructed to take Toprol-XL 12.5 to 25 mg as needed for sustained heart rate greater than 100 bpm   2. Hypertension: -Blood pressure today was well controlled at 128/80 -Continue current treatment plan with Lotensin 10 mg  Disposition: Follow-up with Rozann Lesches, MD or APP in 1 month    Medication Adjustments/Labs and Tests Ordered: Current medicines are reviewed at length with the patient today.  Concerns regarding medicines are outlined above.   Signed, Mable Fill, Marissa Nestle, NP 06/02/2022, 12:03 PM [place]

## 2022-06-01 NOTE — Telephone Encounter (Signed)
Patient c/o Palpitations:  High priority if patient c/o lightheadedness, shortness of breath, or chest pain  How long have you had palpitations/irregular HR/ Afib? Are you having the symptoms now? Started Saturday night, not having symptoms now  Are you currently experiencing lightheadedness, SOB or CP? No   Do you have a history of afib (atrial fibrillation) or irregular heart rhythm? Yes  Have you checked your BP or HR? (document readings if available): Today 128//72 HR 96  Are you experiencing any other symptoms? No   Patient states she was put on a antibiotic after a recent surgery and is unsure if this could be the cause of recent afib episodes.

## 2022-06-01 NOTE — Telephone Encounter (Signed)
Reports A-Fib twice this weekend. First episode was on Saturday night and again on last night. Says HR was elevated at 128. Says was placed on antibiotic recently and took last dose of sulfa 800 mg yesterday. Denies chest pain or sob. Denies a-fib at this time. Reports restarting Eliquis 2.5 mg yesterday after sugery. Medications reviewed. Gave first available appointment to Ambrose Pancoast tomorrow at 11:30 am [place]. Advised if she develops worsening symptoms, to go to the ED for an evaluation. Verbalized understanding of plan.

## 2022-06-02 ENCOUNTER — Encounter: Payer: Self-pay | Admitting: Nurse Practitioner

## 2022-06-02 ENCOUNTER — Ambulatory Visit: Payer: Medicare Other | Admitting: Nurse Practitioner

## 2022-06-02 ENCOUNTER — Ambulatory Visit (INDEPENDENT_AMBULATORY_CARE_PROVIDER_SITE_OTHER): Payer: Medicare Other

## 2022-06-02 VITALS — BP 128/80 | HR 98 | Ht 62.0 in | Wt 171.0 lb

## 2022-06-02 DIAGNOSIS — I480 Paroxysmal atrial fibrillation: Secondary | ICD-10-CM | POA: Diagnosis not present

## 2022-06-02 DIAGNOSIS — I10 Essential (primary) hypertension: Secondary | ICD-10-CM | POA: Diagnosis not present

## 2022-06-02 MED ORDER — METOPROLOL SUCCINATE ER 25 MG PO TB24: 12.5000 mg | ORAL_TABLET | ORAL | 3 refills | Status: AC | PRN

## 2022-06-02 NOTE — Patient Instructions (Signed)
Medication Instructions:  Start Metoprolol Succinate 12.5 mg as needed for heart rates higher than 100   *If you need a refill on your cardiac medications before your next appointment, please call your pharmacy*   Lab Work: None ordered   If you have labs (blood work) drawn today and your tests are completely normal, you will receive your results only by: [place] (if you have MyChart) OR A paper copy in the mail If you have any lab test that is abnormal or we need to change your treatment, we will call you to review the results.   Testing/Procedures: A zio monitor was ordered today. It will remain on for 14 days. You will then return monitor and event diary in provided box. It takes 1-2 weeks for report to be downloaded and returned to [place]. We will call you with the results. If monitor falls off or has orange flashing light, please call Zio for further instructions.     Follow-Up: At [facility], [place], you and your health needs are our priority.  As part of our continuing mission to provide you with exceptional heart care, we have created designated Provider Care Teams.  These Care Teams include your primary Cardiologist (physician) and Advanced Practice Providers (APPs -  Physician Assistants and Nurse Practitioners) who all work together to provide you with the care you need, when you need it.  We recommend signing up for the patient portal called "MyChart".  Sign up information is provided on this After Visit Summary.  MyChart is used to connect with patients for Virtual Visits (Telemedicine).  Patients are able to view lab/test results, encounter notes, upcoming appointments, etc.  Non-urgent messages can be sent to your provider as well.   To learn more about what you can do with MyChart, go to [email or web address].    Your next appointment:   1 month(s)  The format for your next appointment:   In Person  Provider:   You may see Rozann Lesches, MD or one of the following  Advanced Practice Providers on your designated Care Team:   Bernerd Pho, PA-C  Ermalinda Barrios, PA-C     Other Instructions [place] Monitor Instructions  Your physician has requested you wear a ZIO patch monitor for 14 days.  This is a single patch monitor. [employer] supplies one patch monitor per enrollment. Additional stickers are not available. Please do not apply patch if you will be having a Nuclear Stress Test,  Echocardiogram, Cardiac CT, MRI, or Chest Xray during the period you would be wearing the  monitor. The patch cannot be worn during these tests. You cannot remove and re-apply the  ZIO XT patch monitor.  Your ZIO patch monitor will be mailed 3 day USPS to your address on file. It may take 3-5 days  to receive your monitor after you have been enrolled.  Once you have received your monitor, please review the enclosed instructions. Your monitor  has already been registered assigning a specific monitor serial # to you.  Billing and Patient Assistance Program Information  We have supplied [employer] with any of your insurance information on file for billing purposes. [employer] offers a sliding scale Patient Assistance Program for patients that do not have  insurance, or whose insurance does not completely cover the cost of the ZIO monitor.  You must apply for the Patient Assistance Program to qualify for this discounted rate.  To apply, please call [employer] at [phone], select option 4, select option 2, ask  to apply for  Patient Assistance Program. Theodore Demark will ask your household income, and how many people  are in your household. They will quote your out-of-pocket cost based on that information.  [employer] will also be able to set up a 85-month interest-free payment plan if needed.  Applying the monitor   Shave hair from upper left chest.  Hold abrader disc by orange tab. Rub abrader in 40 strokes over the upper left chest as  indicated in your monitor  instructions.  Clean area with 4 enclosed alcohol pads. Let dry.  Apply patch as indicated in monitor instructions. Patch will be placed under collarbone on left  side of chest with arrow pointing upward.  Rub patch adhesive wings for 2 minutes. Remove white label marked "1". Remove the white  label marked "2". Rub patch adhesive wings for 2 additional minutes.  While looking in a mirror, press and release button in center of patch. A small green light will  flash 3-4 times. This will be your only indicator that the monitor has been turned on.  Do not shower for the first 24 hours. You may shower after the first 24 hours.  Press the button if you feel a symptom. You will hear a small click. Record Date, Time and  Symptom in the Patient Logbook.  When you are ready to remove the patch, follow instructions on the last 2 pages of Patient  Logbook. Stick patch monitor onto the last page of Patient Logbook.  Place Patient Logbook in the blue and white box. Use locking tab on box and tape box closed  securely. The blue and white box has prepaid postage on it. Please place it in the mailbox as  soon as possible. Your physician should have your test results approximately 7 days after the  monitor has [facility]  Call IO[employer]at [phone] you have questions regarding  your ZIO XT patch monitor. Call them immediately if you see an orange light blinking on your  monitor.  If your monitor falls off in less than 4 days, contact our Monitor department at [phone]  If your monitor becomes loose or falls off after 4 days call [employer] at [phone]  suggestions on securing your monitor

## 2022-06-02 NOTE — Progress Notes (Unsigned)
Enrolled for [employer] to mail a ZIO XT long term holter monitor to the patients address on file.   Dr. Rozann Lesches to read.

## 2022-06-04 DIAGNOSIS — I480 Paroxysmal atrial fibrillation: Secondary | ICD-10-CM

## 2022-06-04 NOTE — Addendum Note (Signed)
Addended by: Drue Novel I on: 06/04/2022 10:47 AM   Modules accepted: Orders

## 2022-07-03 ENCOUNTER — Ambulatory Visit: Payer: Medicare Other | Admitting: Nurse Practitioner

## 2022-08-07 ENCOUNTER — Ambulatory Visit: Payer: Medicare Other | Attending: Cardiology | Admitting: Cardiology

## 2022-08-07 ENCOUNTER — Encounter: Payer: Self-pay | Admitting: Cardiology

## 2022-08-07 VITALS — BP 132/88 | HR 88 | Ht 62.5 in | Wt 169.8 lb

## 2022-08-07 DIAGNOSIS — I10 Essential (primary) hypertension: Secondary | ICD-10-CM

## 2022-08-07 DIAGNOSIS — I4821 Permanent atrial fibrillation: Secondary | ICD-10-CM

## 2022-08-07 MED ORDER — APIXABAN 5 MG PO TABS: ORAL_TABLET | ORAL | 0 refills | Status: DC

## 2022-08-07 MED ORDER — DILTIAZEM HCL ER COATED BEADS 180 MG PO CP24: 180.0000 mg | ORAL_CAPSULE | Freq: Every day | ORAL | 1 refills | Status: DC

## 2022-08-07 NOTE — Addendum Note (Signed)
Addended by: Sung Amabile on: 08/07/2022 04:27 PM   Modules accepted: Orders

## 2022-08-07 NOTE — Patient Instructions (Addendum)
Medication Instructions:  Your physician has recommended you make the following change in your medication:  Increase diltiazem to 180 mg once a day Continue all other medications as directed.   Labwork: none  Testing/Procedures: none  Follow-Up:  Your physician recommends that you schedule a follow-up appointment in: 6 months  Any Other Special Instructions Will Be Listed Below (If Applicable).  If you need a refill on your cardiac medications before your next appointment, please call your pharmacy.

## 2022-08-07 NOTE — Progress Notes (Signed)
Cardiology Office Note  Date: 08/07/2022   ID: Tara Floyd, DOB 29-Mar-1946, MRN [id number]  PCP:  Adaline Sill, NP  Cardiologist:  Rozann Lesches, MD Electrophysiologist:  None   Chief Complaint  Patient presents with   Cardiac follow-up    History of Present Illness: Tara Floyd is a 76 y.o. female last seen in August by Mr. Barbarann Ehlers NP, I reviewed the note.  She is here for follow-up visit.  Remains asymptomatic in terms of palpitations and atrial fibrillation.  She has returned to exercising in a group class at the gym 4 days a week.  Reports NYHA class II dyspnea.  Heart rate today at rest ranges between 80 and 100.  She is on Cardizem CD 120 mg daily.  Was given a dose of Toprol-XL to take as needed for persisting elevated heart rate after her prior office visit.  She otherwise remains on Eliquis and continues to prefer taking it at 2.5 mg twice daily.  Past Medical History:  Diagnosis Date   Allergic rhinitis    Depression    DJD (degenerative joint disease)    Essential hypertension    GERD (gastroesophageal reflux disease)    PAF (paroxysmal atrial fibrillation) ([place])     Past Surgical History:  Procedure Laterality Date   CHOLECYSTECTOMY N/A 05/18/2018   Procedure: LAPAROSCOPIC CHOLECYSTECTOMY;  Surgeon: Virl Cagey, MD;  Location: [facility];  Service: General;  Laterality: N/A;   COLONOSCOPY N/A 09/27/2019   Procedure: COLONOSCOPY;  Surgeon: Rogene Houston, MD;  Location: [facility];  Service: Endoscopy;  Laterality: N/A;  [place]     ROBOTIC ASSISTED LAPAROSCOPIC SACROCOLPOPEXY N/A 05/28/2022   Procedure: XI ROBOTIC ASSISTED LAPAROSCOPIC SACROCOLPOPEXY;  Surgeon: Ardis Hughs, MD;  Location: [facility];  Service: Urology;  Laterality: N/A;   TUBAL LIGATION     VAGINAL HYSTERECTOMY  08/22/2019    Current Outpatient Medications  Medication Sig Dispense Refill   acetaminophen (TYLENOL) 325 MG tablet Take 650 mg by  mouth every 6 (six) hours as needed for moderate pain.     ALPRAZolam (XANAX) 0.5 MG tablet Take 0.5 mg by mouth 3 (three) times daily as needed for anxiety.     apixaban (ELIQUIS) 2.5 MG TABS tablet Take 2.5 mg by mouth 2 (two) times daily.     benazepril (LOTENSIN) 10 MG tablet Take 1 tablet (10 mg total) by mouth daily. 90 tablet 3   diclofenac sodium (VOLTAREN) 1 % GEL Apply 1 application topically 2 (two) times daily as needed (pain).      estradiol (ESTRACE VAGINAL) 0.1 MG/GM vaginal cream Place 1 Applicatorful vaginally 3 (three) times a week. Use 1 small dolyp of cream on tip of index finger and swap the inside of the vagina 42.5 g 1   loratadine (CLARITIN) 10 MG tablet Take 10 mg by mouth daily as needed for allergies.     metoprolol succinate (TOPROL XL) 25 MG 24 hr tablet Take 0.5 tablets (12.5 mg total) by mouth as needed (Heart rates higher than 100). 45 tablet 3   omeprazole (PRILOSEC) 40 MG capsule Take 40 mg by mouth daily.     Propylene Glycol (SYSTANE COMPLETE) 0.6 % SOLN Place 1 drop into both eyes as needed (dry/irritated eyes).     sulfamethoxazole-trimethoprim (BACTRIM DS) 800-160 MG tablet Take 1 tablet by mouth 2 (two) times daily. 6 tablet 0   traMADol (ULTRAM) 50 MG tablet Take 1-2  tablets (50-100 mg total) by mouth every 6 (six) hours as needed for moderate pain. 15 tablet 0   diltiazem (CARDIZEM CD) 180 MG 24 hr capsule Take 1 capsule (180 mg total) by mouth daily. 90 capsule 1   No current facility-administered medications for this visit.   Allergies:  Codeine phosphate   ROS: No orthopnea or PND.  No syncope.  Physical Exam: VS:  BP 132/88   Pulse 88   Ht 5' 2.5" (1.588 m)   Wt 169 lb 12.8 oz (77 kg)   SpO2 97%   BMI 30.56 kg/m , BMI Body mass index is 30.56 kg/m.  Wt Readings from Last 3 Encounters:  08/07/22 169 lb 12.8 oz (77 kg)  06/02/22 171 lb (77.6 kg)  05/28/22 166 lb 7.2 oz (75.5 kg)    General: Patient appears comfortable at rest. HEENT:  Conjunctiva and lids normal. Neck: Supple, no elevated JVP or carotid bruits. Lungs: Clear to auscultation, nonlabored breathing at rest. Cardiac: Irregularly irregular, no S3, 1/6 systolic murmur. Extremities: No pitting edema.  ECG:  An ECG dated 06/02/2022 was personally reviewed today and demonstrated:  Atrial fibrillation with leftward axis and decreased R wave progression, nonspecific T wave changes.  Recent Labwork: 05/18/2022: ALT 14; AST 20; Platelets 231 05/29/2022: BUN 13; Creatinine, Ser 0.94; Hemoglobin 12.4; Potassium 4.0; Sodium 140   Other Studies Reviewed Today:  Cardiac monitor September 2023: ZIO XT reviewed.  11 days, 20 hours analyzed.   1.  Predominant rhythm is atrial fibrillation with heart rate ranging from 51 bpm up to 177 bpm and average heart rate 83 bpm. 2.  Rare PVCs were noted including ventricular couplets representing less than 1% total beats. 3.  There were no pauses. 4.  No patient triggered events.  Assessment and Plan:  1.  Permanent atrial fibrillation with CHA2DS2-VASc score of 4.  She is asymptomatic in terms of palpitations.  Plan to increase Cardizem CD to 180 mg daily to attain better heart rate control and reduce risk of cardiomyopathy.  Continue Eliquis for stroke prophylaxis (she prefers 2.5 mg twice daily although technically 5 mg twice daily dose is indicated).  2.  Essential hypertension, systolic in the 017C today.  She is also on Lotensin.  Continue with current regimen and follow-up plan.  Medication Adjustments/Labs and Tests Ordered: Current medicines are reviewed at length with the patient today.  Concerns regarding medicines are outlined above.   Tests Ordered: No orders of the defined types were placed in this encounter.   Medication Changes: Meds ordered this encounter  Medications   diltiazem (CARDIZEM CD) 180 MG 24 hr capsule    Sig: Take 1 capsule (180 mg total) by mouth daily.    Dispense:  90 capsule    Refill:  1     08/07/22 Dose Increase    Disposition:  Follow up  6 months.  Signed, Satira Sark, MD, [facility] 08/07/2022 4:09 PM    [place] [facility] [place], [place],  [id number] Phone: [phone]; Fax: [phone]

## 2022-10-30 ENCOUNTER — Other Ambulatory Visit: Payer: Self-pay | Admitting: Cardiology

## 2023-02-25 NOTE — Progress Notes (Signed)
    Cardiology Office Note  Date: 02/26/2023   ID: Tara Floyd, DOB 14-Apr-1946, MRN [id number]  History of Present Illness: Tara Floyd is a 77 y.o. female last seen in October 2023.  She is here for a routine visit.  Reports no sense of palpitations or change in stamina.  She goes to the gym for exercise classes 4 days a week.  I reviewed her medications, she reports no spontaneous bleeding problems on Eliquis and remains on Cardizem CD 180 mg daily.  She does have Toprol-XL tablets for use as needed, but has not had to take any of these.  She had recent lab work which I reviewed.  Physical Exam: VS:  BP 136/82   Pulse 78   Ht 5' 2.5" (1.588 m)   Wt 173 lb 9.6 oz (78.7 kg)   SpO2 98%   BMI 31.25 kg/m , BMI Body mass index is 31.25 kg/m.  Wt Readings from Last 3 Encounters:  02/26/23 173 lb 9.6 oz (78.7 kg)  08/07/22 169 lb 12.8 oz (77 kg)  06/02/22 171 lb (77.6 kg)    General: Patient appears comfortable at rest. HEENT: Conjunctiva and lids normal. Neck: Supple, no elevated JVP or carotid bruits. Lungs: Clear to auscultation, nonlabored breathing at rest. Cardiac: Irregularly irregular, no S3, 1/6 systolic murmur. Extremities: No pitting edema.  ECG:  An ECG dated 06/02/2022 was personally reviewed today and demonstrated:  Atrial fibrillation with leftward axis and decreased R wave progression, nonspecific T wave changes.  Labwork: 05/18/2022: ALT 14; AST 20; Platelets 231 05/29/2022: BUN 13; Creatinine, Ser 0.94; Hemoglobin 12.4; Potassium 4.0; Sodium 140  February 2024: Cholesterol 154, triglycerides 74, HDL 49, LDL 91 May 2024: Hemoglobin 15, platelets 245, BUN 16, creatinine 0.86, potassium 4.9, AST 16, ALT 11  Other Studies Reviewed Today:  No interval cardiac testing for review today.  Assessment and Plan:  1.  Permanent atrial fibrillation with CHA2DS2-VASc score of 4.  She is asymptomatic in terms of palpitations and has good heart rate control on Cardizem CD 100  mg daily.  Plan to continue Eliquis for stroke prophylaxis, she prefers the 2.5 mg twice daily dose.  I reviewed her recent lab work.  2.  Essential hypertension.  Also on Lotensin.  No changes were made today.  Keep follow-up with PCP.  Disposition:  Follow up  6 months.  Signed, Jonelle Sidle, M.D., F.A.C.C. [facility] at [facility]

## 2023-02-26 ENCOUNTER — Encounter: Payer: Self-pay | Admitting: Cardiology

## 2023-02-26 ENCOUNTER — Ambulatory Visit: Payer: Medicare Other | Attending: Cardiology | Admitting: Cardiology

## 2023-02-26 VITALS — BP 136/82 | HR 78 | Ht 62.5 in | Wt 173.6 lb

## 2023-02-26 DIAGNOSIS — I4821 Permanent atrial fibrillation: Secondary | ICD-10-CM | POA: Diagnosis not present

## 2023-02-26 DIAGNOSIS — I10 Essential (primary) hypertension: Secondary | ICD-10-CM | POA: Diagnosis not present

## 2023-02-26 NOTE — Patient Instructions (Addendum)

## 2023-03-12 ENCOUNTER — Other Ambulatory Visit: Payer: Self-pay | Admitting: Cardiology

## 2023-04-12 ENCOUNTER — Other Ambulatory Visit: Payer: Self-pay | Admitting: Cardiology

## 2023-09-07 ENCOUNTER — Ambulatory Visit: Payer: Medicare Other | Attending: Cardiology | Admitting: Cardiology

## 2023-09-07 ENCOUNTER — Encounter: Payer: Self-pay | Admitting: Cardiology

## 2023-09-07 VITALS — BP 130/80 | HR 84 | Ht 60.0 in | Wt 172.4 lb

## 2023-09-07 DIAGNOSIS — I4821 Permanent atrial fibrillation: Secondary | ICD-10-CM

## 2023-09-07 DIAGNOSIS — I10 Essential (primary) hypertension: Secondary | ICD-10-CM | POA: Diagnosis not present

## 2023-09-07 MED ORDER — APIXABAN 5 MG PO TABS: ORAL_TABLET | ORAL | 0 refills | Status: DC

## 2023-09-07 NOTE — Addendum Note (Signed)
Addended by: Eustace Moore on: 09/07/2023 09:37 AM   Modules accepted: Orders

## 2023-09-07 NOTE — Progress Notes (Signed)
    Cardiology Office Note  Date: 09/07/2023   ID: Tara Floyd, DOB [birth year], MRN [id number]  History of Present Illness: Tara Floyd is a 76 y.o. female last seen in May.  She is here for a follow-up visit.  Reports no palpitations, stable NYHA class II dyspnea, no exertional chest pain or change in stamina.  I reviewed her medications.  Current cardiac regimen includes Eliquis, Lotensin, Cardizem CD, Toprol-XL as needed, and Lasix.  ECG today shows atrial fibrillation with leftward axis and borderline low voltage.  I reviewed her lab work from August as noted below.  Physical Exam: VS:  BP 130/80 (BP Location: Left Arm)   Pulse 84   Ht 5' (1.524 m)   Wt 172 lb 6.4 oz (78.2 kg)   SpO2 98%   BMI 33.67 kg/m , BMI Body mass index is 33.67 kg/m.  Wt Readings from Last 3 Encounters:  09/07/23 172 lb 6.4 oz (78.2 kg)  02/26/23 173 lb 9.6 oz (78.7 kg)  08/07/22 169 lb 12.8 oz (77 kg)    General: Patient appears comfortable at rest. HEENT: Conjunctiva and lids normal. Neck: Supple, no elevated JVP or carotid bruits. Lungs: Clear to auscultation, nonlabored breathing at rest. Cardiac: Irregularly irregular, 1/6 systolic murmur, no gallop.  ECG:  An ECG dated 06/02/2022 was personally reviewed today and demonstrated:  Atrial fibrillation with leftward axis and decreased R wave progression, nonspecific T wave changes.  Labwork:  August 2024: Hemoglobin 14.9, platelets 250, BUN 12, creatinine 0.91, potassium 4.5, AST 15, ALT 11, cholesterol 187, triglycerides 97, HDL 44, LDL 125  Other Studies Reviewed Today:  No interval cardiac testing for review today.  Assessment and Plan:  1.  Permanent atrial fibrillation with CHA2DS2-VASc score of 4.  Asymptomatic, she has not had to use any as needed Toprol-XL.  Continue Cardizem CD and Eliquis (she prefers 2.5 mg twice daily) as before.  She does not report any spontaneous bleeding problems.   2.  Primary hypertension.  Continue  Lotensin and keep follow-up with PCP.  Disposition:  Follow up  6 months.  Signed, Jonelle Sidle, M.D., F.A.C.C. [facility] at [facility]

## 2023-09-07 NOTE — Patient Instructions (Signed)

## 2024-01-07 ENCOUNTER — Other Ambulatory Visit: Payer: Self-pay | Admitting: Cardiology

## 2024-02-28 ENCOUNTER — Other Ambulatory Visit: Payer: Self-pay | Admitting: Cardiology

## 2024-03-16 ENCOUNTER — Ambulatory Visit: Payer: Medicare Other | Attending: Cardiology | Admitting: Cardiology

## 2024-03-16 ENCOUNTER — Encounter: Payer: Self-pay | Admitting: Cardiology

## 2024-03-16 VITALS — BP 128/82 | HR 86 | Ht 60.0 in | Wt 168.0 lb

## 2024-03-16 DIAGNOSIS — I10 Essential (primary) hypertension: Secondary | ICD-10-CM

## 2024-03-16 DIAGNOSIS — I4821 Permanent atrial fibrillation: Secondary | ICD-10-CM | POA: Diagnosis not present

## 2024-03-16 MED ORDER — APIXABAN 2.5 MG PO TABS: 2.5000 mg | ORAL_TABLET | Freq: Two times a day (BID) | ORAL | Status: AC

## 2024-03-16 NOTE — Progress Notes (Signed)
    Cardiology Office Note  Date: 03/16/2024   ID: BRUNETTE LAVALLE, DOB Dec 02, 1945, MRN [id number]  History of Present Illness: Tara Floyd is a 78 y.o. female last seen in November 2024.  She is here for a routine visit.  Reports no interval sense of palpitations, no exertional chest pain.  Stable stamina.  We went over her medications.  She reports compliance with current regimen.  Blood pressure initially elevated today, rechecked by me at 128/82.  She states that her pressures have actually been running on the lower side based on home checks.  No specific change in current regimen.  She does not report any spontaneous bleeding problems on Eliquis .  Physical Exam: VS:  BP 128/82 (BP Location: Right Arm)   Pulse 86   Ht 5' (1.524 m)   Wt 168 lb (76.2 kg)   SpO2 98%   BMI 32.81 kg/m , BMI Body mass index is 32.81 kg/m.  Wt Readings from Last 3 Encounters:  03/16/24 168 lb (76.2 kg)  09/07/23 172 lb 6.4 oz (78.2 kg)  02/26/23 173 lb 9.6 oz (78.7 kg)    General: Patient appears comfortable at rest. HEENT: Conjunctiva and lids normal. Neck: Supple, no elevated JVP or carotid bruits. Lungs: Clear to auscultation, nonlabored breathing at rest. Cardiac: Irregularly irregular, 1/6 systolic murmur without gallop. Extremities: No pitting edema.  ECG:  An ECG dated 09/07/2023 was personally reviewed today and demonstrated:  Atrial fibrillation with leftward axis and borderline low voltage.  Labwork:  May 2025: Hemoglobin 16.2, platelets 231, BUN 11, creatinine 0.84, GFR 71, potassium 5, AST 17, ALT 12, cholesterol 162, triglycerides 93, HDL 47, LDL 98  Other Studies Reviewed Today:  No interval cardiac testing for review today.  Assessment and Plan:  1.  Permanent atrial fibrillation with CHA2DS2-VASc score of 4.  She reports no palpitations and continues on Cardizem  CD 180 mg daily.  Has not had to use any additional Toprol -XL.  She is on Eliquis  at 2.5 mg twice daily, she prefers to  stay on the lower dose given concerns about bleeding.  Tolerating well.   2.  Primary hypertension.  No changes made to current regimen.  She is on Lotensin  10 mg daily as well.  Disposition:  Follow up 6 months.  Signed, Gerard Knight, M.D., F.A.C.C. [facility] at [facility]

## 2024-03-16 NOTE — Patient Instructions (Addendum)

## 2024-09-19 ENCOUNTER — Encounter: Payer: Self-pay | Admitting: Cardiology

## 2024-09-19 ENCOUNTER — Ambulatory Visit: Attending: Cardiology | Admitting: Cardiology

## 2024-09-19 VITALS — BP 132/80 | HR 93 | Ht 61.0 in | Wt 174.8 lb

## 2024-09-19 DIAGNOSIS — I10 Essential (primary) hypertension: Secondary | ICD-10-CM

## 2024-09-19 DIAGNOSIS — I4821 Permanent atrial fibrillation: Secondary | ICD-10-CM

## 2024-09-19 NOTE — Progress Notes (Signed)
    Cardiology Office Note  Date: 09/19/2024   ID: Tara Floyd, DOB 01/19/1946, MRN [id number]  History of Present Illness: Tara Floyd is a 78 y.o. female last seen in June.  She is here for a routine visit.  Reports no chest pain or sense of palpitations.  She has been under stress, her husband is going through treatment for prostate cancer.  She tells me that her blood pressures have been intermittently elevated in the evenings and she has taken a half Lotensin  tablet during those times.  I reviewed her medications and we elected not to make any specific changes today.  I reviewed her lab work from August as well.  I reviewed her ECG today which shows atrial fibrillation with borderline low voltage and decreased R wave progression, left anterior fascicular block, heart rate 99 bpm.  Physical Exam: VS:  BP 132/80   Pulse 93   Ht 5' 1 (1.549 m)   Wt 174 lb 12.8 oz (79.3 kg)   SpO2 97%   BMI 33.03 kg/m , BMI Body mass index is 33.03 kg/m.  Wt Readings from Last 3 Encounters:  09/19/24 174 lb 12.8 oz (79.3 kg)  03/16/24 168 lb (76.2 kg)  09/07/23 172 lb 6.4 oz (78.2 kg)    General: Patient appears comfortable at rest. HEENT: Conjunctiva and lids normal. Neck: Supple, no elevated JVP or carotid bruits. Lungs: Clear to auscultation, nonlabored breathing at rest. Cardiac: Irregularly irregular, 1/6 systolic murmur, no gallop. Extremities: No pitting edema.  ECG:  An ECG dated 09/07/2023 was personally reviewed today and demonstrated:  Atrial fibrillation with leftward axis and borderline low voltage.  Labwork:  August 2025: Hemoglobin 15.5, platelets 235, BUN 17, creatinine 0.86, GFR 69, potassium 4.4, AST 14, ALT 10, cholesterol 170, triglycerides 96, HDL 45, LDL 107  Other Studies Reviewed Today:  No interval cardiac testing for review today.  Assessment and Plan:  1.  Permanent atrial fibrillation with CHA2DS2-VASc score of 4.  Continue Cardizem  CD 180 mg daily and  Eliquis  2.5 mg twice daily (she prefers to stay on the lower dose given concerns about bleeding risk).  Asymptomatic in terms of palpitations, ECG reviewed.   2.  Primary hypertension.  Blood pressure reasonable today on current regimen including Lotensin  10 mg daily.  She has had some elevations in the evenings intermittently.  Continue to track blood pressure at home in case additional up titration in standing therapy is necessary.  Disposition:  Follow up 6 months.  Signed, Jayson JUDITHANN Sierras, M.D., F.A.C.C. [facility] at [facility]

## 2024-09-19 NOTE — Patient Instructions (Signed)

## 2024-09-25 ENCOUNTER — Other Ambulatory Visit: Payer: Self-pay | Admitting: Cardiology

## 2024-11-01 ENCOUNTER — Other Ambulatory Visit: Payer: Self-pay | Admitting: Cardiology
